# Patient Record
Sex: Male | Born: 1955 | Race: White | Hispanic: No | Marital: Married | Smoking: Former smoker
Health system: Southern US, Community
[De-identification: ages and names within clinical notes are randomized; demographics above are authoritative.]

## PROBLEM LIST (undated history)

## (undated) DIAGNOSIS — G4700 Insomnia, unspecified: Secondary | ICD-10-CM

## (undated) DIAGNOSIS — M5430 Sciatica, unspecified side: Secondary | ICD-10-CM

## (undated) DIAGNOSIS — E785 Hyperlipidemia, unspecified: Secondary | ICD-10-CM

## (undated) DIAGNOSIS — I252 Old myocardial infarction: Secondary | ICD-10-CM

## (undated) DIAGNOSIS — K219 Gastro-esophageal reflux disease without esophagitis: Secondary | ICD-10-CM

## (undated) DIAGNOSIS — R972 Elevated prostate specific antigen [PSA]: Secondary | ICD-10-CM

## (undated) DIAGNOSIS — IMO0002 Reserved for concepts with insufficient information to code with codable children: Secondary | ICD-10-CM

## (undated) DIAGNOSIS — H9193 Unspecified hearing loss, bilateral: Secondary | ICD-10-CM

## (undated) DIAGNOSIS — N138 Other obstructive and reflux uropathy: Secondary | ICD-10-CM

## (undated) DIAGNOSIS — R7303 Prediabetes: Secondary | ICD-10-CM

## (undated) DIAGNOSIS — R002 Palpitations: Secondary | ICD-10-CM

## (undated) DIAGNOSIS — N529 Male erectile dysfunction, unspecified: Secondary | ICD-10-CM

## (undated) DIAGNOSIS — I219 Acute myocardial infarction, unspecified: Secondary | ICD-10-CM

## (undated) DIAGNOSIS — J309 Allergic rhinitis, unspecified: Secondary | ICD-10-CM

## (undated) DIAGNOSIS — I10 Essential (primary) hypertension: Secondary | ICD-10-CM

## (undated) DIAGNOSIS — M1990 Unspecified osteoarthritis, unspecified site: Secondary | ICD-10-CM

## (undated) DIAGNOSIS — I2510 Atherosclerotic heart disease of native coronary artery without angina pectoris: Secondary | ICD-10-CM

## (undated) DIAGNOSIS — N401 Enlarged prostate with lower urinary tract symptoms: Secondary | ICD-10-CM

## (undated) HISTORY — DX: Insomnia, unspecified: G47.00

## (undated) HISTORY — DX: Unspecified hearing loss, bilateral: H91.93

## (undated) HISTORY — DX: Male erectile dysfunction, unspecified: N52.9

## (undated) HISTORY — DX: Sciatica, unspecified side: M54.30

## (undated) HISTORY — DX: Prediabetes: R73.03

## (undated) HISTORY — DX: Old myocardial infarction: I25.2

## (undated) HISTORY — DX: Palpitations: R00.2

## (undated) HISTORY — DX: Allergic rhinitis, unspecified: J30.9

## (undated) HISTORY — DX: Gastro-esophageal reflux disease without esophagitis: K21.9

## (undated) HISTORY — DX: Other obstructive and reflux uropathy: N13.8

## (undated) HISTORY — PX: HYDROCELE EXCISION / REPAIR: SUR1145

## (undated) HISTORY — DX: Hyperlipidemia, unspecified: E78.5

## (undated) HISTORY — DX: Elevated prostate specific antigen (PSA): R97.20

## (undated) HISTORY — PX: OTHER SURGICAL HISTORY: SHX169

## (undated) HISTORY — PX: PTCA: SHX146

## (undated) HISTORY — PX: COLONOSCOPY: SHX174

## (undated) HISTORY — PX: PROSTATE BIOPSY: SHX241

## (undated) HISTORY — DX: Reserved for concepts with insufficient information to code with codable children: IMO0002

## (undated) HISTORY — DX: Other obstructive and reflux uropathy: N40.1

---

## 1996-06-06 HISTORY — PX: INGUINAL HERNIA REPAIR: SUR1180

## 2003-03-07 DIAGNOSIS — C61 Malignant neoplasm of prostate: Secondary | ICD-10-CM

## 2003-03-07 HISTORY — DX: Malignant neoplasm of prostate: C61

## 2009-06-06 DIAGNOSIS — I252 Old myocardial infarction: Secondary | ICD-10-CM

## 2009-06-06 DIAGNOSIS — I219 Acute myocardial infarction, unspecified: Secondary | ICD-10-CM

## 2009-06-06 HISTORY — DX: Old myocardial infarction: I25.2

## 2009-06-06 HISTORY — DX: Acute myocardial infarction, unspecified: I21.9

## 2013-01-31 HISTORY — PX: CARDIAC CATHETERIZATION: SHX172

## 2015-01-05 HISTORY — PX: OTHER SURGICAL HISTORY: SHX169

## 2015-05-26 HISTORY — PX: CARDIOVASCULAR STRESS TEST: SHX262

## 2015-10-08 ENCOUNTER — Emergency Department (HOSPITAL_COMMUNITY): Payer: Managed Care, Other (non HMO)

## 2015-10-08 ENCOUNTER — Encounter (HOSPITAL_COMMUNITY): Payer: Self-pay | Admitting: Emergency Medicine

## 2015-10-08 ENCOUNTER — Emergency Department (HOSPITAL_COMMUNITY)
Admission: EM | Admit: 2015-10-08 | Discharge: 2015-10-08 | Disposition: A | Discharge status: Home / Self Care | Payer: Managed Care, Other (non HMO) | Attending: Emergency Medicine | Admitting: Emergency Medicine

## 2015-10-08 DIAGNOSIS — W260XXA Contact with knife, initial encounter: Secondary | ICD-10-CM | POA: Diagnosis not present

## 2015-10-08 DIAGNOSIS — Y9289 Other specified places as the place of occurrence of the external cause: Secondary | ICD-10-CM | POA: Diagnosis not present

## 2015-10-08 DIAGNOSIS — I10 Essential (primary) hypertension: Secondary | ICD-10-CM | POA: Diagnosis not present

## 2015-10-08 DIAGNOSIS — Y9389 Activity, other specified: Secondary | ICD-10-CM | POA: Diagnosis not present

## 2015-10-08 DIAGNOSIS — Y998 Other external cause status: Secondary | ICD-10-CM | POA: Diagnosis not present

## 2015-10-08 DIAGNOSIS — Z7902 Long term (current) use of antithrombotics/antiplatelets: Secondary | ICD-10-CM | POA: Insufficient documentation

## 2015-10-08 DIAGNOSIS — S61012A Laceration without foreign body of left thumb without damage to nail, initial encounter: Secondary | ICD-10-CM | POA: Diagnosis not present

## 2015-10-08 DIAGNOSIS — IMO0002 Reserved for concepts with insufficient information to code with codable children: Secondary | ICD-10-CM

## 2015-10-08 DIAGNOSIS — I2510 Atherosclerotic heart disease of native coronary artery without angina pectoris: Secondary | ICD-10-CM | POA: Insufficient documentation

## 2015-10-08 DIAGNOSIS — T148 Other injury of unspecified body region: Secondary | ICD-10-CM | POA: Insufficient documentation

## 2015-10-08 HISTORY — DX: Atherosclerotic heart disease of native coronary artery without angina pectoris: I25.10

## 2015-10-08 HISTORY — DX: Essential (primary) hypertension: I10

## 2015-10-08 MED ORDER — LIDOCAINE-EPINEPHRINE (PF) 2 %-1:200000 IJ SOLN
10.0000 mL | Freq: Once | INTRAMUSCULAR | Status: AC
  Administered 2015-10-08: 10 mL
  Filled 2015-10-08: qty 20

## 2015-10-08 NOTE — ED Provider Notes (Signed)
CSN: [id number]     Arrival date & time 10/08/15  1201 History   First MD Initiated Contact with Patient 10/08/15 1217     No chief complaint on file.    (Consider location/radiation/quality/duration/timing/severity/associated sxs/prior Treatment) HPI Comments: Yale Wilner is a 60 y.o. male reports to ED with laceration to left thumb. Patient is currently on blood thinners Brilanta and ASA. Injury occurred approximately 1.5 hours ago. He was filleting a fish when he was stung by a bee, the knife slipped and he cut his left distal lateral thumb. Denies numbness, tingling, or weakness in thumb. No lightheadedness or dizziness. Believes cut is superficial.   The history is provided by the patient.    Past Medical History  Diagnosis Date  . Coronary artery disease   . Hypertension    Past Surgical History  Procedure Laterality Date  . Ptca     No family history on file. Social History  Substance Use Topics  . Smoking status: Never Smoker   . Smokeless tobacco: None  . Alcohol Use: Yes    Review of Systems  Respiratory: Negative for shortness of breath.   Cardiovascular: Negative for chest pain.  Skin: Positive for wound.  Neurological: Negative for dizziness, weakness, light-headedness and numbness.      Allergies  Review of patient's allergies indicates no known allergies.  Home Medications   Prior to Admission medications   Not on File   BP 120/76 mmHg  Pulse 54  Temp(Src) 98.1 F (36.7 C)  Resp 16  SpO2 98% Physical Exam  Constitutional: He appears well-developed and well-nourished. No distress.  HENT:  Head: Normocephalic and atraumatic.  Eyes: Conjunctivae are normal. No scleral icterus.  Neck: Normal range of motion.  Cardiovascular:  2+ radial pulse and capillary refill intact in all fingers of left hand.    Pulmonary/Chest: Effort normal. No respiratory distress.  Musculoskeletal: Normal range of motion.  Neurological: He is alert.  Strength is  intact, ROM intact, and sensation intact in left thumb.   Skin: Skin is warm and dry. Bruising and laceration noted. He is not diaphoretic.     Psychiatric: He has a normal mood and affect.    ED Course  .Marland KitchenLaceration Repair Date/Time: 10/08/2015 2:32 PM Performed by: Roxanna Mew Authorized by: Roxanna Mew Consent: Verbal consent obtained. Risks and benefits: risks, benefits and alternatives were discussed Consent given by: patient Patient understanding: patient states understanding of the procedure being performed Patient consent: the patient's understanding of the procedure matches consent given Procedure consent: procedure consent matches procedure scheduled Relevant documents: relevant documents present and verified Site marked: the operative site was marked Imaging studies: imaging studies available Required items: required blood products, implants, devices, and special equipment available Patient identity confirmed: verbally with patient and arm band Time out: Immediately prior to procedure a "time out" was called to verify the correct patient, procedure, equipment, support staff and site/side marked as required. Body area: upper extremity Location details: left thumb Laceration length: 3.8 cm Foreign bodies: no foreign bodies Tendon involvement: none Nerve involvement: none Vascular damage: no Anesthesia: local infiltration Local anesthetic: lidocaine 2% with epinephrine Anesthetic total: 4 ml Patient sedated: no Preparation: Patient was prepped and draped in the usual sterile fashion. Irrigation solution: saline Irrigation method: syringe Amount of cleaning: standard Debridement: none Degree of undermining: none Skin closure: Ethilon (4-0) Number of sutures: 8 Technique: simple Approximation: close Approximation difficulty: simple Dressing: antibiotic ointment and 4x4 sterile gauze Patient tolerance: Patient tolerated  the procedure well with no  immediate complications   (including critical care time) Labs Review Labs Reviewed - No data to display  Imaging Review Dg Finger Thumb Left  10/08/2015  CLINICAL DATA:  laceration with fishing knife to prox lat 1st digit area today ,lots of bleeding EXAM: LEFT THUMB 2+V COMPARISON:  None. FINDINGS: No acute fracture or dislocation. Mild asymmetric narrowing of the first metacarpal phalangeal joint. Old fracture of the medial base of the proximal phalanx of the thumb. No radiopaque foreign body. IMPRESSION: No fracture.  No radiopaque foreign body.  No dislocation. Electronically Signed   By: Lajean Manes M.D.   On: 10/08/2015 14:00   I have personally reviewed and evaluated these images and lab results as part of my medical decision-making. No fracture or dislocation of left thumb. No foreign bodies noted. In line with radiology report.    EKG Interpretation None      MDM   Final diagnoses:  Laceration    Patient reports to ED following left thumb laceration with fishing knife. Patient is on blood thinners. Strength and ROM intact of left thumb. Neurovascularly intact. Laceration is 1.5 inches, "u" shaped on the proximal lateral aspect at left thumb base, close to thenar eminence. Radiologic images of thumb were negative for foreign body or fracture. I completed a local anesthetic block, irrigated and explored wound beg, following sterile practices placed 8 sutures, and dressed the wound with ABX ointment and sterile bandage. Patient tolerated procedure well. Last tetanus shot was one month ago. Discussed wound care and return precautions with patient to include fever, streaking up the arm, or redness/swelling/warmth/malodorous discharge of the wound site. Discussed follow up with PCP in next 7 days for suture removal. Patient voiced understanding and is agreeable.     Roxanna Mew, [place] 10/08/15 1626  Dorie Rank, MD 10/09/15 [phone]

## 2015-10-08 NOTE — ED Notes (Signed)
Pt sent from an Urgent Care. Laceration to left thumb with knife while cutting fish. Was stung by a bee to forehead which caused his knife to slip. Hand is currently wrapped in compression dressing from [facility].

## 2015-10-08 NOTE — Discharge Instructions (Signed)
Keep wound area clean and dry. Follow up with PCP in the next week for suture removal. If you develop fever, chills, night sweats or pain, swelling, erythema, malodorous discharge, or streaking up your arm return to the ED immediately.

## 2015-10-08 NOTE — ED Notes (Signed)
suture cart at bedside

## 2015-10-08 NOTE — ED Notes (Signed)
Up to bathroom

## 2015-10-08 NOTE — ED Notes (Signed)
PT IS ON BRILANTA (BLOOD THINNER).

## 2015-12-17 ENCOUNTER — Encounter: Payer: Self-pay | Admitting: Family Medicine

## 2015-12-17 ENCOUNTER — Ambulatory Visit (INDEPENDENT_AMBULATORY_CARE_PROVIDER_SITE_OTHER): Payer: Managed Care, Other (non HMO) | Admitting: Family Medicine

## 2015-12-17 VITALS — BP 97/66 | HR 61 | Temp 98.2°F | Resp 16 | Ht 68.0 in | Wt 183.0 lb

## 2015-12-17 DIAGNOSIS — I2510 Atherosclerotic heart disease of native coronary artery without angina pectoris: Secondary | ICD-10-CM | POA: Diagnosis not present

## 2015-12-17 DIAGNOSIS — M7581 Other shoulder lesions, right shoulder: Secondary | ICD-10-CM | POA: Diagnosis not present

## 2015-12-17 DIAGNOSIS — N138 Other obstructive and reflux uropathy: Secondary | ICD-10-CM

## 2015-12-17 DIAGNOSIS — Z9861 Coronary angioplasty status: Secondary | ICD-10-CM | POA: Diagnosis not present

## 2015-12-17 DIAGNOSIS — M7521 Bicipital tendinitis, right shoulder: Secondary | ICD-10-CM

## 2015-12-17 DIAGNOSIS — N401 Enlarged prostate with lower urinary tract symptoms: Secondary | ICD-10-CM

## 2015-12-17 DIAGNOSIS — Z85828 Personal history of other malignant neoplasm of skin: Secondary | ICD-10-CM

## 2015-12-17 DIAGNOSIS — Z955 Presence of coronary angioplasty implant and graft: Secondary | ICD-10-CM

## 2015-12-17 NOTE — Progress Notes (Addendum)
Office Note 12/17/2015  CC:  Chief Complaint  Patient presents with  . Establish Care    Pt is not fasting.  . Shoulder Pain    right shoulder x 2-3 weeks    HPI:  Ricky Chavez is a 60 y.o. White male who is here to establish care and discuss R shoulder pain. Patient's most recent primary MD: Dr. Jeanell Sparrow in [place], [place]. Old records were not reviewed prior to or during today's visit.  Right shoulder pain, onset 3 weeks ago, after going fishing a lot.  Has rested it some.  Has taken tylenol 1000mg  once a day on bad days.  Front aspect of shoulder is painful with certain ranges of motion, but at rest it does not hurt.  No catching or locking.  Denies hx of similar problem.  If he lays on it too long it starts to hurt a lot. No neck pain.  No radiation of the pain.  No paresthesias.  Past Medical History  Diagnosis Date  . Coronary artery disease     Brylinta and ASA lifetime due to high risk status.  . Hypertension   . Nonmelanoma skin cancer     SCC  . Allergic rhinitis   . Hyperlipidemia   . History of myocardial infarction   . BPH with obstruction/lower urinary tract symptoms   . Hearing loss of both ears     Past Surgical History  Procedure Laterality Date  . Ptca      Cath 2011---4 stents.  2014 repeat cath for CP showed no changes.  . Inguinal hernia repair  1998  . Colonoscopy  2008    Normal    Family History  Problem Relation Age of Onset  . Hyperlipidemia Mother   . Hypertension Mother   . Heart disease Mother   . Heart disease Father   . Hyperlipidemia Father   . Hypertension Father   . Hypothyroidism Sister   . Heart attack Brother   . Prostate cancer Paternal Uncle   . Stroke Neg Hx   . Diabetes Neg Hx   . CAD Brother     Social History   Social History  . Marital Status: Married    Spouse Name: N/A  . Number of Children: N/A  . Years of Education: N/A   Occupational History  . Not on file.   Social History Main Topics  .  Smoking status: Former Smoker -- 0.25 packs/day for 2 years    Types: Cigarettes    Quit date: 06/06/1978  . Smokeless tobacco: Never Used  . Alcohol Use: 4.2 oz/week    7 Glasses of wine per week  . Drug Use: No  . Sexual Activity: Not on file   Other Topics Concern  . Not on file   Social History Narrative   Married, 2 grown children.   Relocated to [place] from [facility] 2016.   Educ: Masters degree   Occup: retired Government social research officer with [employer]   No tob.   Alc: 1 glass wine/night.    Outpatient Encounter Prescriptions as of 12/17/2015  Medication Sig  . aspirin EC 81 MG tablet Take 1 tablet by mouth daily.  Marland Kitchen atorvastatin (LIPITOR) 40 MG tablet Take 1 tablet by mouth daily.  . carvedilol (COREG) 6.25 MG tablet Take 1 tablet by mouth 2 (two) times daily.  . Dutasteride-Tamsulosin HCl (JALYN) 0.5-0.4 MG CAPS Take 1 tablet by mouth daily.  Marland Kitchen lisinopril (PRINIVIL,ZESTRIL) 5 MG tablet Take 1 tablet by mouth daily.  Marland Kitchen  Melatonin (MELATONIN MAXIMUM STRENGTH) 5 MG TABS Take 1 tablet by mouth at bedtime.  . Multiple Vitamins-Minerals (MULTIVITAMIN ADULT PO) Take 1 capsule by mouth daily.  . nitroGLYCERIN (NITROSTAT) 0.4 MG SL tablet Place 1 tablet under the tongue every 5 (five) minutes as needed. As needed for chest pain mas of 3 doses in 15 minutes.  Marland Kitchen omeprazole (PRILOSEC) 20 MG capsule Take 1 capsule by mouth daily.  . ticagrelor (BRILINTA) 60 MG TABS tablet Take 1 tablet by mouth 2 (two) times daily.  . vardenafil (LEVITRA) 20 MG tablet Take 1 tablet by mouth daily as needed.   No facility-administered encounter medications on file as of 12/17/2015.    No Known Allergies  ROS Review of Systems  Constitutional: Negative for fever and fatigue.  HENT: Negative for congestion and sore throat.   Eyes: Negative for visual disturbance.  Respiratory: Negative for cough.   Cardiovascular: Negative for chest pain.  Gastrointestinal: Negative for nausea and abdominal pain.   Genitourinary: Negative for dysuria.  Musculoskeletal: Positive for arthralgias (see HPI). Negative for back pain and joint swelling.  Skin: Negative for rash.  Neurological: Negative for weakness and headaches.  Hematological: Negative for adenopathy.    PE; Blood pressure 97/66, pulse 61, temperature 98.2 F (36.8 C), temperature source Oral, resp. rate 16, height 5\' 8"  (1.727 m), weight 183 lb (83.008 kg), SpO2 96 %. Gen: Alert, well appearing.  Patient is oriented to person, place, time, and situation. [id number]: no injection, icteris, swelling, or exudate.  EOMI, PERRLA. Mouth: lips without lesion/swelling.  Oral mucosa pink and moist. Oropharynx without erythema, exudate, or swelling.  CV: RRR, no m/r/g.   LUNGS: CTA bilat, nonlabored resps, good aeration in all lung fields. EXT: no clubbing, cyanosis, or edema.  Right shoulder: nontender except for directly over the long head of biceps in bicipital groove. Mildly + speed's and mildly + Yergason's. AC joint nontender.  No tenderness around/on acromion. Pain with aBduction at 90 deg and through full abduction.  Neg drop sign. No pain with ER or IR.  Arm strength 5/5 prox/dist.  Pertinent labs:  None today  ASSESSMENT AND PLAN:   New pt; need to obtain old records.  1) Right shoulder pain; suspect combination of supraspinatus tendonitis and bicipital tendonitis. Given that this has only been going on 3 weeks and he is on Brylinta and ASA, I recommended PT over steroid injection at this time.  He was agreeable to this approach, ordered PT referral to [facility] today. He'll continue tylenol 1000mg  q6h prn.  2) CAD, with hx of MI/PCTA/Stenting in 2011.  He says that due to his coronary anatomy and his strong FH of CV dz, his cardiologist recommended he stay on DAPT lifetime unless contraindication/adverse side effect arose. Will get old records. Referred pt to one of our [place] cardiologist's today.  3) BPH with lower  urinary tract obst sx's: pt desires referral to specialist in the area since going to his [facility] specialist is no longer feasible.  Referral ordered today.  4) hx of nonmelanoma skin cancer: refer to local derm for ongoing surveillance.  An After Visit Summary was printed and given to the patient.  Return in about 4 weeks (around 01/14/2016) for 30 min appt (fasting) f/u shoulder and may need labs.  Signed:  Crissie Sickles, MD           12/17/2015   ADDENDUM 12/22/15:  Most recent labs in prior PCP's records are dated 09/01/15: CMET normal and  CBC normal and lipid panel normal.  HbA1c 5.6% 09/01/15. Also, PSA on 07/15/15 was 4.61.

## 2015-12-17 NOTE — Progress Notes (Signed)
Pre visit review using our clinic review tool, if applicable. No additional management support is needed unless otherwise documented below in the visit note. 

## 2015-12-22 ENCOUNTER — Encounter: Payer: Self-pay | Admitting: Family Medicine

## 2016-01-13 ENCOUNTER — Ambulatory Visit: Payer: Managed Care, Other (non HMO) | Admitting: Family Medicine

## 2016-01-14 ENCOUNTER — Ambulatory Visit: Payer: Managed Care, Other (non HMO) | Admitting: Cardiology

## 2016-01-27 ENCOUNTER — Ambulatory Visit (INDEPENDENT_AMBULATORY_CARE_PROVIDER_SITE_OTHER): Payer: Managed Care, Other (non HMO) | Admitting: Family Medicine

## 2016-01-27 ENCOUNTER — Encounter: Payer: Self-pay | Admitting: Family Medicine

## 2016-01-27 ENCOUNTER — Encounter: Payer: Self-pay | Admitting: Gastroenterology

## 2016-01-27 VITALS — BP 100/69 | HR 54 | Temp 98.0°F | Resp 16 | Ht 68.0 in | Wt 185.5 lb

## 2016-01-27 DIAGNOSIS — K625 Hemorrhage of anus and rectum: Secondary | ICD-10-CM

## 2016-01-27 DIAGNOSIS — M25511 Pain in right shoulder: Secondary | ICD-10-CM

## 2016-01-27 NOTE — Progress Notes (Signed)
Pre visit review using our clinic review tool, if applicable. No additional management support is needed unless otherwise documented below in the visit note. 

## 2016-01-27 NOTE — Progress Notes (Signed)
OFFICE VISIT  01/27/2016   CC:  Chief Complaint  Patient presents with  . Follow-up    shoulder   HPI:    Patient is a 60 y.o. Caucasian male who presents for 6 week f/u right shoulder pain--suspected supraspinatus and bicipital tendonitis. Has been getting PT.  He benefited greatly from PT.  Was essentially pain-free and then went go cart riding recently and exacerbated his pain.  He feels comfortable doing the home exercises that PT showed him, though.  He recently had a couple episodes of BRBPR in last couple weeks.  In between these episodes he had no blood on toilet tissue.  The blood did not appear to be in the fecal matter.  No pain.  No episodes of melena. He doesn't feel anything prolapsing from anus. No prob with constipation.  Says last couple months stool has been softer than his normal but he is not having more frequent or less frequent BMs during this time.  No straining.  He does sit on toilet and read quite a bit.  Past Medical History:  Diagnosis Date  . Allergic rhinitis   . BPH with obstruction/lower urinary tract symptoms   . Coronary artery disease    Brylinta and ASA lifetime due to high risk status.  . Elevated PSA onset @ 2009   Neg bx x 3, most recent 2009 ([facility] in [place]).  PSA stable at 3.98 12/01/14 per old PCP records.  Marland Kitchen GERD (gastroesophageal reflux disease)   . Hearing loss of both ears   . History of myocardial infarction 2011  . Hyperlipidemia   . Hypertension   . Insomnia   . Nonmelanoma skin cancer    SCC  . Organic impotence     Past Surgical History:  Procedure Laterality Date  . CARDIAC CATHETERIZATION  01/31/13   Stents to LAD patent; small diagonal stent with 90% ostial stenosis--good overall LV function with mild anterior and focal inferoapical hypokinesis, EF 60-65%.  Unsuccessful angioplasty of diagonal.  . CARDIOVASCULAR STRESS TEST  05/26/15   stress echo neg for ischemia  . COLONOSCOPY  2008   Normal  .  HYDROCELE EXCISION / REPAIR  early 2000's  . INGUINAL HERNIA REPAIR  1998  . PROSTATE BIOPSY     Multiple; all neg  . PTCA     Cath 2011---4 stents--LAD and diagonal.  2014 repeat cath for CP showed no changes.  . Scrotal u/s  01/2015   numerous extratesticular cystic lesions bilat, c/w numerous epididymal cysts vs loculated hydrocele--following expectantly.    Outpatient Medications Prior to Visit  Medication Sig Dispense Refill  . aspirin EC 81 MG tablet Take 1 tablet by mouth daily.    Marland Kitchen atorvastatin (LIPITOR) 40 MG tablet Take 1 tablet by mouth daily.    . carvedilol (COREG) 6.25 MG tablet Take 1 tablet by mouth 2 (two) times daily.    . Dutasteride-Tamsulosin HCl (JALYN) 0.5-0.4 MG CAPS Take 1 tablet by mouth daily.    Marland Kitchen lisinopril (PRINIVIL,ZESTRIL) 5 MG tablet Take 1 tablet by mouth daily.    . Melatonin (MELATONIN MAXIMUM STRENGTH) 5 MG TABS Take 1 tablet by mouth at bedtime.    . Multiple Vitamins-Minerals (MULTIVITAMIN ADULT PO) Take 1 capsule by mouth daily.    . nitroGLYCERIN (NITROSTAT) 0.4 MG SL tablet Place 1 tablet under the tongue every 5 (five) minutes as needed. As needed for chest pain mas of 3 doses in 15 minutes.    Marland Kitchen omeprazole (PRILOSEC) 20 MG  capsule Take 1 capsule by mouth daily.    . ticagrelor (BRILINTA) 60 MG TABS tablet Take 1 tablet by mouth 2 (two) times daily.    . vardenafil (LEVITRA) 20 MG tablet Take 1 tablet by mouth daily as needed.     No facility-administered medications prior to visit.     No Known Allergies  ROS As per HPI  PE: Blood pressure 100/69, pulse (!) 54, temperature 98 F (36.7 C), temperature source Oral, resp. rate 16, height 5\' 8"  (1.727 m), weight 185 lb 8 oz (84.1 kg), SpO2 97 %. Gen: Alert, well appearing.  Patient is oriented to person, place, time, and situation. AFFECT: pleasant, lucid thought and speech. Rectal exam: negative without mass, lesions or tenderness. Prostate without asymmetry, nodularity, or  tenderness. No blood on glove.  LABS:  None today  IMPRESSION AND PLAN:  1) Right shoulder tendonitis pain: very good response to PT. He'll continue these exercises for his recent "flare" of pain after go-cart riding.  2) Bright red blood per rectum: suspect internal hemorrhoids. Pt is due for his 10 year repeat colonoscopy in 2018. He is in favor of a referral to GI at this time. Advised pt to sit on toilet to have BM and then spend no further time sitting.  3) Chronic illness lab f/u: he has had all necessary monitoring labs at last CPE with his previous PCP back in March this year.  We'll plan on rechecking these labs again at his next CPE here in March 2018. (Health panel + PSA).  An After Visit Summary was printed and given to the patient.  FOLLOW UP: Return in about 7 months (around 08/26/2016) for annual CPE (fasting).  Signed:  Crissie Sickles, MD           01/27/2016

## 2016-03-23 ENCOUNTER — Encounter: Payer: Self-pay | Admitting: Family Medicine

## 2016-03-30 ENCOUNTER — Encounter: Payer: Self-pay | Admitting: *Deleted

## 2016-03-31 NOTE — Progress Notes (Signed)
Cardiology Office Note   Date:  04/01/2016   ID:  Ricky Chavez, DOB Aug 01, 1955, MRN [id number]  PCP:  [name], MD  Cardiologist:   Minus Breeding, MD  Referring:  [name], MD  Chief Complaint  Patient presents with  . Coronary Artery Disease      History of Present Illness: Ricky Chavez is a 60 y.o. male who presents for follow up of CAD.  The patient was treated at [facility] for CAD in late 2016.  I see records indicating a cardiac cath with stents to LAD patent; small diagonal stent with 90% ostial stenosis--good overall LV function with mild anterior and focal inferoapical hypokinesis, EF 60-65%. Unsuccessful angioplasty of the diagonal.  He had a stress echo in 2016 December that was negative for any evidence of ischemia.  The patient had a complicated course with his initial angioplasty and I have reviewed these records. However, he did have a stress echocardiogram as mentioned there was negative in December. I have these very active having moved to this area. 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 He does exercises. With this he denies any chest pressure, neck or arm discomfort. He doesn't have any palpitations, presyncope or syncope. Denies any PND or orthopnea. He's had no weight gain or edema.  Past Medical History:  Diagnosis Date  . Allergic rhinitis   . BPH with obstruction/lower urinary tract symptoms   . Coronary artery disease    Brilinta and ASA lifetime due to high risk status.  . Elevated PSA onset @ 2009   Neg bx x 3, most recent 2009 ([facility] in [place]).  PSA stable at 3.98 12/01/14 and 4.61 on 07/25/15 per old records.  PSA 10 at Dr. Noland Fordyce ([facility]) 03/2016: flomax started and plan is to recheck PSA and f/u with urol in 6 mo.  Marland Kitchen GERD (gastroesophageal reflux disease)   . Hearing loss of both ears   . History of myocardial infarction 2011  . Hyperlipidemia   . Hypertension   . Insomnia   . Nonmelanoma skin cancer    SCC  . Organic impotence     Past Surgical History:  Procedure Laterality Date  . CARDIAC CATHETERIZATION  01/31/13   Stents to LAD patent; small diagonal stent with 90% ostial stenosis--good overall LV function with mild anterior and focal inferoapical hypokinesis, EF 60-65%.  Unsuccessful angioplasty of diagonal.  . CARDIOVASCULAR STRESS TEST  05/26/15   stress echo neg for ischemia  . COLONOSCOPY  2008   Normal  . HYDROCELE EXCISION / REPAIR  early 2000's  . INGUINAL HERNIA REPAIR  1998  . PROSTATE BIOPSY     Multiple; all neg  . PTCA     Cath 2011---4 stents--LAD and diagonal.  2014 repeat cath for CP showed no changes.  . Scrotal u/s  01/2015   numerous extratesticular cystic lesions bilat, c/w numerous epididymal cysts vs loculated hydrocele--following expectantly.     Current Outpatient Prescriptions  Medication Sig Dispense Refill  . aspirin EC 81 MG tablet Take 1 tablet by mouth daily.    Marland Kitchen atorvastatin (LIPITOR) 40 MG tablet Take 1 tablet by mouth daily.    . carvedilol (COREG) 6.25 MG tablet Take 1 tablet (6.25 mg total) by mouth 2 (two) times daily. 180 tablet 3  . lisinopril (PRINIVIL,ZESTRIL) 5 MG tablet Take 1 tablet (5 mg total) by mouth daily. 90 tablet 3  . Melatonin (MELATONIN MAXIMUM STRENGTH) 5 MG TABS Take 1 tablet by mouth at  bedtime.    . Multiple Vitamins-Minerals (MULTIVITAMIN ADULT PO) Take 1 capsule by mouth daily.    . nitroGLYCERIN (NITROSTAT) 0.4 MG SL tablet Place 1 tablet (0.4 mg total) under the tongue every 5 (five) minutes as needed. As needed for chest pain mas of 3 doses in 15 minutes. 25 tablet 3  . omeprazole (PRILOSEC) 20 MG capsule Take 1 capsule by mouth daily.    . tamsulosin (FLOMAX) 0.4 MG CAPS capsule Take 0.4 mg by mouth daily.    . ticagrelor (BRILINTA) 60 MG TABS tablet Take 1 tablet (60 mg total) by mouth 2 (two) times daily. 180 tablet 3  . vardenafil (LEVITRA) 20 MG tablet Take 1 tablet by mouth daily as needed.     No current  facility-administered medications for this visit.     Allergies:   Review of patient's allergies indicates no known allergies.    Social History:  The patient  reports that he quit smoking about 37 years ago. His smoking use included Cigarettes. He has a 0.50 pack-year smoking history. He has never used smokeless tobacco. He reports that he drinks about 4.2 oz of alcohol per week . He reports that he does not use drugs.   Family History:  The patient's family history includes Heart attack (age of onset: 24) in his brother; Heart disease (age of onset: 11) in his father and mother; Hyperlipidemia in his father and mother; Hypertension in his father and mother; Hypothyroidism in his sister; Peripheral vascular disease in his brother; Prostate cancer in his paternal uncle.    ROS:  Please see the history of present illness.   Otherwise, review of systems are positive for none.   All other systems are reviewed and negative.    PHYSICAL EXAM: VS:  BP 112/79   Pulse 73   Ht 5\' 8"  (1.727 m)   Wt 183 lb (83 kg)   SpO2 98%   BMI 27.83 kg/m  , BMI Body mass index is 27.83 kg/m. GENERAL:  Well appearing HEENT:  Pupils equal round and reactive, fundi not visualized, oral mucosa unremarkable NECK:  No jugular venous distention, waveform within normal limits, carotid upstroke brisk and symmetric, no bruits, no thyromegaly LYMPHATICS:  No cervical, inguinal adenopathy LUNGS:  Clear to auscultation bilaterally BACK:  No CVA tenderness CHEST:  Unremarkable HEART:  PMI not displaced or sustained,S1 and S2 within normal limits, no S3, no S4, no clicks, no rubs, no murmurs ABD:  Flat, positive bowel sounds normal in frequency in pitch, no bruits, no rebound, no guarding, no midline pulsatile mass, no hepatomegaly, no splenomegaly EXT:  2 plus pulses throughout, no edema, no cyanosis no clubbing SKIN:  No rashes no nodules NEURO:  Cranial nerves II through XII grossly intact, motor grossly intact  throughout PSYCH:  Cognitively intact, oriented to person place and time    EKG:  EKG is not ordered today.    Recent Labs: No results found for requested labs within last 8760 hours.    Lipid Panel No results found for: CHOL, TRIG, HDL, CHOLHDL, VLDL, LDLCALC, LDLDIRECT    Wt Readings from Last 3 Encounters:  04/01/16 183 lb (83 kg)  01/27/16 185 lb 8 oz (84.1 kg)  12/17/15 183 lb (83 kg)      Other studies Reviewed: Additional studies/ records that were reviewed today include: Extensive hospital records. Review of the above records demonstrates:  Please see elsewhere in the note.     ASSESSMENT AND PLAN:  CAD:  The patient has no new sypmtoms.  No further cardiovascular testing is indicated.  We will continue with aggressive risk reduction and meds as listed.  Of note it was suggested that he should continue on life long DAPT.  DYSLIPIDEMIA:  He has an excellent lipid profile.  He will continue the current therapy.  HTN:  The blood pressure is at target. No change in medications is indicated. We will continue with therapeutic lifestyle changes (TLC).   Current medicines are reviewed at length with the patient today.  The patient does not have concerns regarding medicines.  The following changes have been made:  no change  Labs/ tests ordered today include:   Orders Placed This Encounter  Procedures  . EKG 12-Lead     Disposition:   FU with me in six months.     Signed, Minus Breeding, MD  04/01/2016 5:07 PM    [place]

## 2016-04-01 ENCOUNTER — Ambulatory Visit (INDEPENDENT_AMBULATORY_CARE_PROVIDER_SITE_OTHER): Payer: Managed Care, Other (non HMO) | Admitting: Cardiology

## 2016-04-01 ENCOUNTER — Encounter: Payer: Self-pay | Admitting: Cardiology

## 2016-04-01 VITALS — BP 112/79 | HR 73 | Ht 68.0 in | Wt 183.0 lb

## 2016-04-01 DIAGNOSIS — I10 Essential (primary) hypertension: Secondary | ICD-10-CM

## 2016-04-01 DIAGNOSIS — I25810 Atherosclerosis of coronary artery bypass graft(s) without angina pectoris: Secondary | ICD-10-CM

## 2016-04-01 MED ORDER — TICAGRELOR 60 MG PO TABS: 60.0000 mg | ORAL_TABLET | Freq: Two times a day (BID) | ORAL | 3 refills | Status: DC

## 2016-04-01 MED ORDER — LISINOPRIL 5 MG PO TABS: 5.0000 mg | ORAL_TABLET | Freq: Every day | ORAL | 3 refills | Status: DC

## 2016-04-01 MED ORDER — NITROGLYCERIN 0.4 MG SL SUBL: 0.4000 mg | SUBLINGUAL_TABLET | SUBLINGUAL | 3 refills | Status: DC | PRN

## 2016-04-01 MED ORDER — CARVEDILOL 6.25 MG PO TABS
6.2500 mg | ORAL_TABLET | Freq: Two times a day (BID) | ORAL | 3 refills | Status: DC
Start: 2016-04-01 — End: 2016-09-28

## 2016-04-01 NOTE — Patient Instructions (Signed)

## 2016-04-06 ENCOUNTER — Ambulatory Visit (INDEPENDENT_AMBULATORY_CARE_PROVIDER_SITE_OTHER): Payer: Managed Care, Other (non HMO) | Admitting: Gastroenterology

## 2016-04-06 ENCOUNTER — Telehealth: Payer: Self-pay

## 2016-04-06 ENCOUNTER — Encounter: Payer: Self-pay | Admitting: Gastroenterology

## 2016-04-06 VITALS — BP 100/66 | HR 56 | Ht 68.0 in | Wt 184.0 lb

## 2016-04-06 DIAGNOSIS — Z1211 Encounter for screening for malignant neoplasm of colon: Secondary | ICD-10-CM

## 2016-04-06 DIAGNOSIS — K625 Hemorrhage of anus and rectum: Secondary | ICD-10-CM

## 2016-04-06 MED ORDER — NA SULFATE-K SULFATE-MG SULF 17.5-3.13-1.6 GM/177ML PO SOLN: 1.0000 | Freq: Once | ORAL | 0 refills | Status: AC

## 2016-04-06 NOTE — Progress Notes (Signed)
HPI: This is a   very pleasant 60 year old man  who was referred to me by [name], MD  to evaluate  bloody diarrhea .    Chief complaint is bloody diarrhea   2 days of brigth red rectal bleeding.  Was having diarrhea at the same.  Was out of town, very spicy [place] food.  This was 3 months ago and since then his bowels have completely been normal. The bloody diarrhea lasted for about a day with 5 episodes.  He's had minor bleeding over years.  Was queasy aroiund that time.  Blood diarrhea.  Bowel have been fine since.  Overall gaining weight.  His father has had polyps.  Colonoscopy in [place]; in 2008 an dit was normal.  He is on brilinta   Review of systems: Pertinent positive and negative review of systems were noted in the above HPI section. Complete review of systems was performed and was otherwise normal.   Past Medical History:  Diagnosis Date  . Allergic rhinitis   . BPH with obstruction/lower urinary tract symptoms   . Coronary artery disease    Brilinta and ASA lifetime due to high risk status.  . Elevated PSA onset @ 2009   Neg bx x 3, most recent 2009 ([facility] in [place]).  PSA stable at 3.98 12/01/14 and 4.61 on 07/25/15 per old records.  PSA 10 at Dr. Noland Fordyce ([facility]) 03/2016: flomax started and plan is to recheck PSA and f/u with urol in 6 mo.  Marland Kitchen GERD (gastroesophageal reflux disease)   . Hearing loss of both ears   . History of myocardial infarction 2011  . Hyperlipidemia   . Hypertension   . Insomnia   . Organic impotence   . Squamous cell carcinoma     Past Surgical History:  Procedure Laterality Date  . CARDIAC CATHETERIZATION  01/31/13   Stents to LAD patent; small diagonal stent with 90% ostial stenosis--good overall LV function with mild anterior and focal inferoapical hypokinesis, EF 60-65%.  Unsuccessful angioplasty of diagonal.  . CARDIOVASCULAR STRESS TEST  05/26/15   stress echo neg for ischemia  . COLONOSCOPY  2008   Normal  . HYDROCELE EXCISION / REPAIR  early 2000's  . INGUINAL HERNIA REPAIR Bilateral 1998  . PROSTATE BIOPSY     Multiple; all neg  . PTCA     Cath 2011---4 stents--LAD and diagonal.  2014 repeat cath for CP showed no changes.  . Scrotal u/s  01/2015   numerous extratesticular cystic lesions bilat, c/w numerous epididymal cysts vs loculated hydrocele--following expectantly.    Current Outpatient Prescriptions  Medication Sig Dispense Refill  . aspirin EC 81 MG tablet Take 1 tablet by mouth daily.    Marland Kitchen atorvastatin (LIPITOR) 40 MG tablet Take 1 tablet by mouth daily.    . carvedilol (COREG) 6.25 MG tablet Take 1 tablet (6.25 mg total) by mouth 2 (two) times daily. 180 tablet 3  . lisinopril (PRINIVIL,ZESTRIL) 5 MG tablet Take 1 tablet (5 mg total) by mouth daily. 90 tablet 3  . Melatonin (MELATONIN MAXIMUM STRENGTH) 5 MG TABS Take 1 tablet by mouth at bedtime.    . Multiple Vitamins-Minerals (MULTIVITAMIN ADULT PO) Take 1 capsule by mouth daily.    . nitroGLYCERIN (NITROSTAT) 0.4 MG SL tablet Place 1 tablet (0.4 mg total) under the tongue every 5 (five) minutes as needed. As needed for chest pain mas of 3 doses in 15 minutes. 25 tablet 3  . omeprazole (PRILOSEC) 20 MG capsule  Take 1 capsule by mouth daily.    . tamsulosin (FLOMAX) 0.4 MG CAPS capsule Take 0.4 mg by mouth daily.    . ticagrelor (BRILINTA) 60 MG TABS tablet Take 1 tablet (60 mg total) by mouth 2 (two) times daily. 180 tablet 3  . vardenafil (LEVITRA) 20 MG tablet Take 1 tablet by mouth daily as needed.     No current facility-administered medications for this visit.     Allergies as of 04/06/2016  . (No Known Allergies)    Family History  Problem Relation Age of Onset  . Hyperlipidemia Mother   . Hypertension Mother   . Heart disease Mother 22    CABG  . Heart disease Father 5    CABG  . Hyperlipidemia Father   . Hypertension Father   . Colon polyps Father   . Hypothyroidism Sister   . Heart attack Brother  74    MI  . Prostate cancer Paternal Uncle   . Peripheral vascular disease Brother     Carotid   . Stroke Neg Hx   . Diabetes Neg Hx     Social History   Social History  . Marital status: Married    Spouse name: N/A  . Number of children: 2  . Years of education: N/A   Occupational History  . retired    Social History Main Topics  . Smoking status: Former Smoker    Packs/day: 0.25    Years: 2.00    Types: Cigarettes    Quit date: 06/06/1978  . Smokeless tobacco: Never Used  . Alcohol use 8.4 oz/week    14 Glasses of wine per week  . Drug use: No  . Sexual activity: Not on file   Other Topics Concern  . Not on file   Social History Narrative   Married, 2 grown children.   Relocated to [place] from [facility] 2016.   Educ: Masters degree   Occup: Retired Government social research officer with [employer]   No tob.   Alc: 1 glass wine/night.     Physical Exam: BP 100/66 (BP Location: Left Arm, Patient Position: Sitting, Cuff Size: Normal)   Pulse (!) 56   Ht 5\' 8"  (1.727 m) Comment: height measured without shoes  Wt 184 lb (83.5 kg)   BMI 27.98 kg/m  Constitutional: generally well-appearing Psychiatric: alert and oriented x3 Eyes: extraocular movements intact Mouth: oral pharynx moist, no lesions Neck: supple no lymphadenopathy Cardiovascular: heart regular rate and rhythm Lungs: clear to auscultation bilaterally Abdomen: soft, nontender, nondistended, no obvious ascites, no peritoneal signs, normal bowel sounds Extremities: no lower extremity edema bilaterally Skin: no lesions on visible extremities   Assessment and plan: 60 y.o. male with  Resolved bloody diarrhea, likely infectious  His bleeding episode was likely from self-limited infectious colitis. His bowels have been completely back to normal for the past 3 months since that 24 hour episode. I see no reason for any further workup for it however he is due for screening for colon cancer since his last testing was in  2008. We will arrange for colonoscopy to be done in early January 2018. He is on Brilinta for coronary artery disease.  we will communicate with his cardiologist about at least a 5 day hold prior to colonoscopy.   Owens Loffler, MD [facility] 04/06/2016, 11:17 AM  Cc: McGowen, Adrian Blackwater, MD

## 2016-04-06 NOTE — Patient Instructions (Signed)
You will be set up for a colonoscopy for screening. Brilinta hold for 5 days. We will communicate with your cardiologist about the safety of holding that medicine.

## 2016-04-06 NOTE — Telephone Encounter (Signed)
04/06/2016   RE: Sharbel Cioffi DOB: December 25, 1955 MRN: [id number]   Dear Dr Percival Spanish,    We have scheduled the above patient for an endoscopic procedure with Dr Ardis Hughs. Our records show that he is on anticoagulation therapy.   Please advise as to how long the patient may come off his therapy of Brilinta prior to the procedure, which is scheduled for 06/14/2016.  Please fax back/ or route response to Christian Mate RN  at [phone].   Sincerely,    Christian Mate RN

## 2016-04-07 ENCOUNTER — Encounter: Payer: Self-pay | Admitting: Family Medicine

## 2016-04-08 NOTE — Telephone Encounter (Signed)
Anti coag letter routed to Meredith Pel Dr Hochrein's nurse for review

## 2016-04-11 ENCOUNTER — Telehealth: Payer: Self-pay | Admitting: *Deleted

## 2016-04-11 NOTE — Telephone Encounter (Signed)
Requesting surgical clearance:   1. Type of surgery: Clonoscopy  2. Surgeon: Dr Ardis Hughs  3. Surgical date: 06/14/2016  4. Medications that need to be help: Brilinta  Pt saw you last on 10/27, is pt cleared for surgery and how long can brilinta be held?

## 2016-04-11 NOTE — Telephone Encounter (Signed)
Resent to Dr Percival Spanish and Meredith Pel

## 2016-04-14 NOTE — Telephone Encounter (Signed)
He could hold the Brilinta if necessary although he is on life long DAPT.  He would be at acceptable risk for the procedure.  If the colonoscopy cannot be continued through a diagnostic colonoscopy then it should be held for five days.

## 2016-04-15 NOTE — Telephone Encounter (Signed)
Pt has been notified to hold Brilinta 5 days prior to procedure in January 2018

## 2016-04-15 NOTE — Telephone Encounter (Signed)
Clearance send to Maylene Roes via [employer]

## 2016-06-14 ENCOUNTER — Ambulatory Visit (AMBULATORY_SURGERY_CENTER): Payer: Managed Care, Other (non HMO) | Admitting: Gastroenterology

## 2016-06-14 ENCOUNTER — Encounter: Payer: Self-pay | Admitting: Gastroenterology

## 2016-06-14 VITALS — BP 89/50 | HR 54 | Temp 97.3°F | Resp 16 | Wt 184.0 lb

## 2016-06-14 DIAGNOSIS — K649 Unspecified hemorrhoids: Secondary | ICD-10-CM | POA: Diagnosis not present

## 2016-06-14 DIAGNOSIS — K621 Rectal polyp: Secondary | ICD-10-CM | POA: Diagnosis not present

## 2016-06-14 DIAGNOSIS — K921 Melena: Secondary | ICD-10-CM | POA: Diagnosis present

## 2016-06-14 DIAGNOSIS — K625 Hemorrhage of anus and rectum: Secondary | ICD-10-CM

## 2016-06-14 DIAGNOSIS — D128 Benign neoplasm of rectum: Secondary | ICD-10-CM

## 2016-06-14 DIAGNOSIS — K635 Polyp of colon: Secondary | ICD-10-CM | POA: Diagnosis not present

## 2016-06-14 MED ORDER — SODIUM CHLORIDE 0.9 % IV SOLN: 500.0000 mL | INTRAVENOUS | Status: DC

## 2016-06-14 NOTE — Patient Instructions (Signed)
YOU HAD AN ENDOSCOPIC PROCEDURE TODAY AT [facility]:   Refer to the procedure report that was given to you for any specific questions about what was found during the examination.  If the procedure report does not answer your questions, please call your gastroenterologist to clarify.  If you requested that your care partner not be given the details of your procedure findings, then the procedure report has been included in a sealed envelope for you to review at your convenience later.  YOU SHOULD EXPECT: Some feelings of bloating in the abdomen. Passage of more gas than usual.  Walking can help get rid of the air that was put into your GI tract during the procedure and reduce the bloating. If you had a lower endoscopy (such as a colonoscopy or flexible sigmoidoscopy) you may notice spotting of blood in your stool or on the toilet paper. If you underwent a bowel prep for your procedure, you may not have a normal bowel movement for a few days.  Please Note:  You might notice some irritation and congestion in your nose or some drainage.  This is from the oxygen used during your procedure.  There is no need for concern and it should clear up in a day or so.  SYMPTOMS TO REPORT IMMEDIATELY:   Following lower endoscopy (colonoscopy or flexible sigmoidoscopy):  Excessive amounts of blood in the stool  Significant tenderness or worsening of abdominal pains  Swelling of the abdomen that is new, acute  Fever of 100F or higher   For urgent or emergent issues, a gastroenterologist can be reached at any hour by calling [phone].   DIET:  We do recommend a small meal at first, but then you may proceed to your regular diet.  Drink plenty of fluids but you should avoid alcoholic beverages for 24 hours.  Try to increase the fiber in your diet, and drink plenty of water.  ACTIVITY:  You should plan to take it easy for the rest of today and you should NOT DRIVE or use heavy machinery until  tomorrow (because of the sedation medicines used during the test).    FOLLOW UP: Our staff will call the number listed on your records the next business day following your procedure to check on you and address any questions or concerns that you may have regarding the information given to you following your procedure. If we do not reach you, we will leave a message.  However, if you are feeling well and you are not experiencing any problems, there is no need to return our call.  We will assume that you have returned to your regular daily activities without incident.  If any biopsies were taken you will be contacted by phone or by letter within the next 1-3 weeks.  Please call us at [phone] if you have not heard about the biopsies in 3 weeks.    SIGNATURES/CONFIDENTIALITY: You and/or your care partner have signed paperwork which will be entered into your electronic medical record.  These signatures attest to the fact that that the information above on your After Visit Summary has been reviewed and is understood.  Full responsibility of the confidentiality of this discharge information lies with you and/or your care-partner.  Resume your blood thinner today.  Read all of the handouts given to you by your recovery room nurse.  Thank-you for choosing [place] for your healthcare needs today.

## 2016-06-14 NOTE — Op Note (Signed)
[facility] Patient Name: Ricky Chavez Procedure Date: 06/14/2016 2:03 PM MRN: [id number] Endoscopist: Milus Banister , MD Age: 61 Referring MD:  Date of Birth: [birth year] Gender: Male Account #: [id number] Procedure:                Colonoscopy Indications:              Hematochezia Medicines:                Monitored Anesthesia Care Procedure:                Pre-Anesthesia Assessment:                           - Prior to the procedure, a History and Physical                            was performed, and patient medications and                            allergies were reviewed. The patient's tolerance of                            previous anesthesia was also reviewed. The risks                            and benefits of the procedure and the sedation                            options and risks were discussed with the patient.                            All questions were answered, and informed consent                            was obtained. Prior Anticoagulants: The patient has                            taken brilinta, last dose was 5 days prior to                            procedure. ASA Grade Assessment: II - A patient                            with mild systemic disease. After reviewing the                            risks and benefits, the patient was deemed in                            satisfactory condition to undergo the procedure.                           After obtaining informed consent, the colonoscope  was passed under direct vision. Throughout the                            procedure, the patient's blood pressure, pulse, and                            oxygen saturations were monitored continuously. The                            Model CF-HQ190L [id number]) scope was introduced                            through the anus and advanced to the the cecum,                            identified by appendiceal orifice and ileocecal                             valve. The colonoscopy was performed without                            difficulty. The patient tolerated the procedure                            well. The quality of the bowel preparation was                            good. The ileocecal valve, appendiceal orifice, and                            rectum were photographed. Scope In: 2:10:43 PM Scope Out: 2:19:43 PM Scope Withdrawal Time: 0 hours 7 minutes 45 seconds  Total Procedure Duration: 0 hours 9 minutes 0 seconds  Findings:                 Two sessile polyps were found in the rectum. The                            polyps were 2 to 3 mm in size. These polyps were                            removed with a cold snare. Resection and retrieval                            were complete.                           Internal hemorrhoids were found. The hemorrhoids                            were small.                           The exam was otherwise without abnormality on  direct and retroflexion views. Complications:            No immediate complications. Estimated blood loss:                            None. Estimated Blood Loss:     Estimated blood loss: none. Impression:               - Two 2 to 3 mm polyps in the rectum, removed with                            a cold snare. Resected and retrieved.                           - Internal hemorrhoids. These are the source of                            your minor intermittent bleeding.                           - The examination was otherwise normal on direct                            and retroflexion views. Recommendation:           - Patient has a contact number available for                            emergencies. The signs and symptoms of potential                            delayed complications were discussed with the                            patient. Return to normal activities tomorrow.                            Written  discharge instructions were provided to the                            patient.                           - Resume previous diet.                           - Continue present medications. OK to resume your                            blood thinner today.                           You will receive a letter within 2-3 weeks with the                            pathology results and my final recommendations.  If the polyp(s) is proven to be 'pre-cancerous' on                            pathology, you will need repeat colonoscopy in 5                            years. If the polyp(s) is NOT 'precancerous' on                            pathology then you should repeat colon cancer                            screening in 10 years with colonoscopy without need                            for colon cancer screening by any method prior to                            then (including stool testing). Milus Banister, MD 06/14/2016 2:23:04 PM This report has been signed electronically.

## 2016-06-14 NOTE — Progress Notes (Signed)
Report to PACU, RN, vss, BBS= Clear.  

## 2016-06-14 NOTE — Progress Notes (Signed)
Called to room to assist during endoscopic procedure.  Patient ID and intended procedure confirmed with present staff. Received instructions for my participation in the procedure from the performing physician.  

## 2016-06-15 ENCOUNTER — Telehealth: Payer: Self-pay

## 2016-06-15 NOTE — Telephone Encounter (Signed)
  Follow up Call-  Call back number 06/14/2016  Post procedure Call Back phone  # [phone]  Permission to leave phone message Yes     Patient questions:  Do you have a fever, pain , or abdominal swelling? No. Pain Score  0 *  Have you tolerated food without any problems? Yes.    Have you been able to return to your normal activities? Yes.    Do you have any questions about your discharge instructions: Diet   No. Medications  No. Follow up visit  No.  Do you have questions or concerns about your Care? No.  Actions: * If pain score is 4 or above: No action needed, pain <4.

## 2016-06-20 ENCOUNTER — Encounter: Payer: Self-pay | Admitting: Gastroenterology

## 2016-08-10 ENCOUNTER — Encounter: Payer: Self-pay | Admitting: Family Medicine

## 2016-08-25 ENCOUNTER — Encounter: Payer: Managed Care, Other (non HMO) | Admitting: Family Medicine

## 2016-09-01 ENCOUNTER — Encounter: Payer: Self-pay | Admitting: Family Medicine

## 2016-09-01 ENCOUNTER — Ambulatory Visit (INDEPENDENT_AMBULATORY_CARE_PROVIDER_SITE_OTHER): Payer: 59 | Admitting: Family Medicine

## 2016-09-01 ENCOUNTER — Other Ambulatory Visit: Payer: Self-pay | Admitting: Family Medicine

## 2016-09-01 ENCOUNTER — Encounter: Payer: Self-pay | Admitting: Cardiology

## 2016-09-01 VITALS — BP 90/62 | HR 64 | Temp 97.9°F | Resp 16 | Ht 68.5 in | Wt 187.5 lb

## 2016-09-01 DIAGNOSIS — Z0000 Encounter for general adult medical examination without abnormal findings: Secondary | ICD-10-CM | POA: Diagnosis not present

## 2016-09-01 DIAGNOSIS — Z114 Encounter for screening for human immunodeficiency virus [HIV]: Secondary | ICD-10-CM

## 2016-09-01 DIAGNOSIS — Z125 Encounter for screening for malignant neoplasm of prostate: Secondary | ICD-10-CM

## 2016-09-01 LAB — CBC WITH DIFFERENTIAL/PLATELET
BASOS ABS: 75 {cells}/uL (ref 0–200)
Basophils Relative: 1 %
EOS PCT: 3 %
Eosinophils Absolute: 225 cells/uL (ref 15–500)
HCT: 43.8 % (ref 38.5–50.0)
Hemoglobin: 14.8 g/dL (ref 13.2–17.1)
LYMPHS PCT: 28 %
Lymphs Abs: 2100 cells/uL (ref 850–3900)
MCH: 30.6 pg (ref 27.0–33.0)
MCHC: 33.8 g/dL (ref 32.0–36.0)
MCV: 90.7 fL (ref 80.0–100.0)
MONOS PCT: 9 %
MPV: 9.3 fL (ref 7.5–12.5)
Monocytes Absolute: 675 cells/uL (ref 200–950)
NEUTROS PCT: 59 %
Neutro Abs: 4425 cells/uL (ref 1500–7800)
PLATELETS: 195 10*3/uL (ref 140–400)
RBC: 4.83 MIL/uL (ref 4.20–5.80)
RDW: 13.8 % (ref 11.0–15.0)
WBC: 7.5 10*3/uL (ref 3.8–10.8)

## 2016-09-01 LAB — LIPID PANEL
CHOLESTEROL: 94 mg/dL (ref <200)
HDL: 27 mg/dL — ABNORMAL LOW (ref >40)
LDL Cholesterol: 43 mg/dL (ref <100)
Total CHOL/HDL Ratio: 3.5 Ratio (ref <5.0)
Triglycerides: 120 mg/dL (ref <150)
VLDL: 24 mg/dL (ref <30)

## 2016-09-01 LAB — PSA: PSA: 10.1 ng/mL — ABNORMAL HIGH (ref <=4.0)

## 2016-09-01 LAB — COMPREHENSIVE METABOLIC PANEL
ALK PHOS: 72 U/L (ref 40–115)
ALT: 42 U/L (ref 9–46)
AST: 35 U/L (ref 10–35)
Albumin: 3.9 g/dL (ref 3.6–5.1)
BILIRUBIN TOTAL: 1 mg/dL (ref 0.2–1.2)
BUN: 12 mg/dL (ref 7–25)
CALCIUM: 9.3 mg/dL (ref 8.6–10.3)
CO2: 23 mmol/L (ref 20–31)
Chloride: 106 mmol/L (ref 98–110)
Creat: 1.03 mg/dL (ref 0.70–1.33)
Glucose, Bld: 100 mg/dL — ABNORMAL HIGH (ref 65–99)
Potassium: 4.4 mmol/L (ref 3.5–5.3)
Sodium: 140 mmol/L (ref 135–146)
Total Protein: 6.5 g/dL (ref 6.1–8.1)

## 2016-09-01 LAB — TSH: TSH: 1.74 m[IU]/L (ref 0.40–4.50)

## 2016-09-01 NOTE — Patient Instructions (Signed)
 Health Maintenance, Male A healthy lifestyle and preventive care is important for your health and wellness. Ask your health care provider about what schedule of regular examinations is right for you. What should I know about weight and diet?  Eat a Healthy Diet  Eat plenty of vegetables, fruits, whole grains, low-fat dairy products, and lean protein.  Do not eat a lot of foods high in solid fats, added sugars, or salt. Maintain a Healthy Weight  Regular exercise can help you achieve or maintain a healthy weight. You should:  Do at least 150 minutes of exercise each week. The exercise should increase your heart rate and make you sweat (moderate-intensity exercise).  Do strength-training exercises at least twice a week. Watch Your Levels of Cholesterol and Blood Lipids  Have your blood tested for lipids and cholesterol every 5 years starting at 60 years of age. If you are at high risk for heart disease, you should start having your blood tested when you are 60 years old. You may need to have your cholesterol levels checked more often if:  Your lipid or cholesterol levels are high.  You are older than 60 years of age.  You are at high risk for heart disease. What should I know about cancer screening? Many types of cancers can be detected early and may often be prevented. Lung Cancer  You should be screened every year for lung cancer if:  You are a current smoker who has smoked for at least 30 years.  You are a former smoker who has quit within the past 15 years.  Talk to your health care provider about your screening options, when you should start screening, and how often you should be screened. Colorectal Cancer  Routine colorectal cancer screening usually begins at 60 years of age and should be repeated every 5-10 years until you are 60 years old. You may need to be screened more often if early forms of precancerous polyps or small growths are found. Your health care provider  may recommend screening at an earlier age if you have risk factors for colon cancer.  Your health care provider may recommend using home test kits to check for hidden blood in the stool.  A small camera at the end of a tube can be used to examine your colon (sigmoidoscopy or colonoscopy). This checks for the earliest forms of colorectal cancer. Prostate and Testicular Cancer  Depending on your age and overall health, your health care provider may do certain tests to screen for prostate and testicular cancer.  Talk to your health care provider about any symptoms or concerns you have about testicular or prostate cancer. Skin Cancer  Check your skin from head to toe regularly.  Tell your health care provider about any new moles or changes in moles, especially if:  There is a change in a mole's size, shape, or color.  You have a mole that is larger than a pencil eraser.  Always use sunscreen. Apply sunscreen liberally and repeat throughout the day.  Protect yourself by wearing long sleeves, pants, a wide-brimmed hat, and sunglasses when outside. What should I know about heart disease, diabetes, and high blood pressure?  If you are 18-39 years of age, have your blood pressure checked every 3-5 years. If you are 40 years of age or older, have your blood pressure checked every year. You should have your blood pressure measured twice-once when you are at a hospital or clinic, and once when you are not at   a hospital or clinic. Record the average of the two measurements. To check your blood pressure when you are not at a hospital or clinic, you can use:  An automated blood pressure machine at a pharmacy.  A home blood pressure monitor.  Talk to your health care provider about your target blood pressure.  If you are between 45-79 years old, ask your health care provider if you should take aspirin to prevent heart disease.  Have regular diabetes screenings by checking your fasting blood sugar  level.  If you are at a normal weight and have a low risk for diabetes, have this test once every three years after the age of 45.  If you are overweight and have a high risk for diabetes, consider being tested at a younger age or more often.  A one-time screening for abdominal aortic aneurysm (AAA) by ultrasound is recommended for men aged 65-75 years who are current or former smokers. What should I know about preventing infection? Hepatitis B  If you have a higher risk for hepatitis B, you should be screened for this virus. Talk with your health care provider to find out if you are at risk for hepatitis B infection. Hepatitis C  Blood testing is recommended for:  Everyone born from 1945 through 1965.  Anyone with known risk factors for hepatitis C. Sexually Transmitted Diseases (STDs)  You should be screened each year for STDs including gonorrhea and chlamydia if:  You are sexually active and are younger than 60 years of age.  You are older than 60 years of age and your health care provider tells you that you are at risk for this type of infection.  Your sexual activity has changed since you were last screened and you are at an increased risk for chlamydia or gonorrhea. Ask your health care provider if you are at risk.  Talk with your health care provider about whether you are at high risk of being infected with HIV. Your health care provider may recommend a prescription medicine to help prevent HIV infection. What else can I do?  Schedule regular health, dental, and eye exams.  Stay current with your vaccines (immunizations).  Do not use any tobacco products, such as cigarettes, chewing tobacco, and e-cigarettes. If you need help quitting, ask your health care provider.  Limit alcohol intake to no more than 2 drinks per day. One drink equals 12 ounces of beer, 5 ounces of wine, or 1 ounces of hard liquor.  Do not use street drugs.  Do not share needles.  Ask your health  care provider for help if you need support or information about quitting drugs.  Tell your health care provider if you often feel depressed.  Tell your health care provider if you have ever been abused or do not feel safe at home. This information is not intended to replace advice given to you by your health care provider. Make sure you discuss any questions you have with your health care provider. Document Released: 11/19/2007 Document Revised: 01/20/2016 Document Reviewed: 02/24/2015 Elsevier Interactive Patient Education  2017 Elsevier Inc.  

## 2016-09-01 NOTE — Progress Notes (Signed)
Office Note 09/01/2016  CC:  Chief Complaint  Patient presents with  . Annual Exam    Pt is fasting.    HPI:  Ricky Chavez is a 61 y.o. male who is here for annual health maintenance exam. Has a couple of complaints to mention--he'll return to address these fully--see ROS.  Eye exam: last was 3-4 yrs ago. Dental preventative visits UTD. Exercise: usually does if warm. Diet: trying to increase veggies/fruits, increasing lean meats.  BP: home monitoring rarely, when he feels dizziness.  Rarely feels this.  Recently went to [place] on mission trip: working with Loss adjuster, chartered.  Past Medical History:  Diagnosis Date  . Allergic rhinitis   . BPH with obstruction/lower urinary tract symptoms   . Coronary artery disease    DAPT lifetime due to high risk status.  . Elevated PSA onset @ 2009   Neg bx x 3, most recent 2009 ([facility] [place]).  PSA stable at 3.98 12/01/14 and 4.61 on 07/25/15 per old records.  PSA 10 at Dr. Noland Fordyce ([facility]) 03/2016: flomax started and plan is to recheck PSA and f/u with urol in 6 mo.  Marland Kitchen GERD (gastroesophageal reflux disease)   . Hearing loss of both ears   . History of myocardial infarction 2011  . Hyperlipidemia   . Hypertension   . Insomnia   . Organic impotence   . Squamous cell carcinoma     Past Surgical History:  Procedure Laterality Date  . CARDIAC CATHETERIZATION  01/31/13   Stents to LAD patent; small diagonal stent with 90% ostial stenosis--good overall LV function with mild anterior and focal inferoapical hypokinesis, EF 60-65%.  Unsuccessful angioplasty of diagonal.  . CARDIOVASCULAR STRESS TEST  05/26/15   stress echo neg for ischemia  . COLONOSCOPY  2008; 06/14/16   2008 Normal.  2018 polyps x 2, path pending. +internal hemorrhoids.  Marland Kitchen HYDROCELE EXCISION / REPAIR  early 2000's  . INGUINAL HERNIA REPAIR Bilateral 1998  . PROSTATE BIOPSY     Multiple; all neg  . PTCA     Cath 2011---4 stents--LAD and  diagonal.  2014 repeat cath for CP showed no changes.  . Scrotal u/s  01/2015   numerous extratesticular cystic lesions bilat, c/w numerous epididymal cysts vs loculated hydrocele--following expectantly.    Family History  Problem Relation Age of Onset  . Hyperlipidemia Mother   . Hypertension Mother   . Heart disease Mother 65    CABG  . Heart disease Father 28    CABG  . Hyperlipidemia Father   . Hypertension Father   . Colon polyps Father   . Hypothyroidism Sister   . Heart attack Brother 54    MI  . Prostate cancer Paternal Uncle   . Peripheral vascular disease Brother     Carotid   . Stroke Neg Hx   . Diabetes Neg Hx     Social History   Social History  . Marital status: Married    Spouse name: N/A  . Number of children: 2  . Years of education: N/A   Occupational History  . retired    Social History Main Topics  . Smoking status: Former Smoker    Packs/day: 0.25    Years: 2.00    Types: Cigarettes    Quit date: 06/06/1978  . Smokeless tobacco: Never Used  . Alcohol use 8.4 oz/week    14 Glasses of wine per week  . Drug use: No  . Sexual activity: Not on  file   Other Topics Concern  . Not on file   Social History Narrative   Married, 2 grown children.   Relocated to [place] from [facility] 2016.   Educ: Masters degree   Occup: Retired Government social research officer with [employer]   No tob.   Alc: 1 glass wine/night.    Outpatient Medications Prior to Visit  Medication Sig Dispense Refill  . aspirin EC 81 MG tablet Take 1 tablet by mouth daily.    Marland Kitchen atorvastatin (LIPITOR) 40 MG tablet Take 1 tablet by mouth daily.    . carvedilol (COREG) 6.25 MG tablet Take 1 tablet (6.25 mg total) by mouth 2 (two) times daily. 180 tablet 3  . lisinopril (PRINIVIL,ZESTRIL) 5 MG tablet Take 1 tablet (5 mg total) by mouth daily. 90 tablet 3  . Melatonin (MELATONIN MAXIMUM STRENGTH) 5 MG TABS Take 1 tablet by mouth at bedtime.    . Multiple Vitamins-Minerals (MULTIVITAMIN ADULT PO)  Take 1 capsule by mouth daily.    . nitroGLYCERIN (NITROSTAT) 0.4 MG SL tablet Place 1 tablet (0.4 mg total) under the tongue every 5 (five) minutes as needed. As needed for chest pain mas of 3 doses in 15 minutes. 25 tablet 3  . omeprazole (PRILOSEC) 20 MG capsule Take 1 capsule by mouth daily.    . tamsulosin (FLOMAX) 0.4 MG CAPS capsule Take 0.4 mg by mouth daily.    . ticagrelor (BRILINTA) 60 MG TABS tablet Take 1 tablet (60 mg total) by mouth 2 (two) times daily. 180 tablet 3  . vardenafil (LEVITRA) 20 MG tablet Take 1 tablet by mouth daily as needed.     Facility-Administered Medications Prior to Visit  Medication Dose Route Frequency Provider Last Rate Last Dose  . 0.9 %  sodium chloride infusion  500 mL Intravenous Continuous Milus Banister, MD        No Known Allergies  ROS Review of Systems  Constitutional: Negative for appetite change, chills, fatigue and fever.  HENT: Negative for congestion, dental problem, ear pain and sore throat.   Eyes: Negative for discharge, redness and visual disturbance.  Respiratory: Negative for cough, chest tightness, shortness of breath and wheezing.   Cardiovascular: Negative for chest pain, palpitations and leg swelling.  Gastrointestinal: Negative for abdominal pain, blood in stool, diarrhea, nausea and vomiting.  Genitourinary: Negative for difficulty urinating, dysuria, flank pain, frequency, hematuria and urgency.  Musculoskeletal: Positive for arthralgias (Right shoulder pain x 2 mo.). Negative for back pain, joint swelling, myalgias and neck stiffness.       R hip pain radiating to R ankle.  Skin: Negative for pallor and rash.  Neurological: Negative for dizziness, speech difficulty, weakness and headaches.  Hematological: Negative for adenopathy. Does not bruise/bleed easily.  Psychiatric/Behavioral: Negative for confusion and sleep disturbance. The patient is not nervous/anxious.     PE; Blood pressure 90/62, pulse 64, temperature  97.9 F (36.6 C), temperature source Oral, resp. rate 16, height 5' 8.5" (1.74 m), weight 187 lb 8 oz (85 kg), SpO2 97 %. Gen: Alert, well appearing.  Patient is oriented to person, place, time, and situation. AFFECT: pleasant, lucid thought and speech. ENT: Ears: EACs clear, normal epithelium.  TMs with good light reflex and landmarks bilaterally.  Eyes: no injection, icteris, swelling, or exudate.  EOMI, PERRLA. Nose: no drainage or turbinate edema/swelling.  No injection or focal lesion.  Mouth: lips without lesion/swelling.  Oral mucosa pink and moist.  Dentition intact and without obvious caries or gingival swelling.  Oropharynx without erythema, exudate, or swelling.  Neck: supple/nontender.  No LAD, mass, or TM.  Carotid pulses 2+ bilaterally, without bruits. CV: RRR, no m/r/g.   LUNGS: CTA bilat, nonlabored resps, good aeration in all lung fields. ABD: soft, NT, ND, BS normal.  No hepatospenomegaly or mass.  No bruits. EXT: no clubbing, cyanosis, or edema.  Musculoskeletal: no joint swelling, erythema, warmth, or tenderness.  ROM of all joints intact. Skin - no sores or suspicious lesions or rashes or color changes Rectal exam: negative without mass, lesions or tenderness, PROSTATE EXAM: smooth and symmetric without nodules or tenderness.  Pertinent labs:  None (none in EMR as well)  ASSESSMENT AND PLAN:   Health maintenance exam: Reviewed age and gender appropriate health maintenance issues (prudent diet, regular exercise, health risks of tobacco and excessive alcohol, use of seatbelts, fire alarms in home, use of sunscreen).  Also reviewed age and gender appropriate health screening as well as vaccine recommendations. Vaccines UTD. Fasting HP + HIV screening today. Prostate ca screening: DRE normal, PSA drawn today. Colon cancer screening: most recent colonoscopy 06/2016 (hyperplastic polyp)--recall 07/2026.  An After Visit Summary was printed and given to the patient.  FOLLOW  UP:  Return Appt at patient's convenience to address shoulder pain and sciatica sx's.   Signed:  Crissie Sickles, MD           09/01/2016

## 2016-09-01 NOTE — Progress Notes (Signed)
Pre visit review using our clinic review tool, if applicable. No additional management support is needed unless otherwise documented below in the visit note. 

## 2016-09-02 ENCOUNTER — Other Ambulatory Visit: Payer: Self-pay | Admitting: *Deleted

## 2016-09-02 LAB — HIV ANTIBODY (ROUTINE TESTING W REFLEX): HIV 1&2 Ab, 4th Generation: NONREACTIVE

## 2016-09-02 MED ORDER — TICAGRELOR 60 MG PO TABS
60.0000 mg | ORAL_TABLET | Freq: Two times a day (BID) | ORAL | 3 refills | Status: DC
Start: 2016-09-02 — End: 2017-08-30

## 2016-09-02 NOTE — Telephone Encounter (Signed)
Rx has been sent to the pharmacy electronically. Brilinta

## 2016-09-04 ENCOUNTER — Encounter: Payer: Self-pay | Admitting: Family Medicine

## 2016-09-05 ENCOUNTER — Other Ambulatory Visit: Payer: Self-pay | Admitting: Family Medicine

## 2016-09-05 ENCOUNTER — Other Ambulatory Visit: Payer: 59

## 2016-09-05 ENCOUNTER — Encounter: Payer: Self-pay | Admitting: *Deleted

## 2016-09-05 DIAGNOSIS — Z1159 Encounter for screening for other viral diseases: Secondary | ICD-10-CM

## 2016-09-05 LAB — HEPATITIS C ANTIBODY: HCV AB: NEGATIVE

## 2016-09-05 MED ORDER — ATORVASTATIN CALCIUM 40 MG PO TABS: 40.0000 mg | ORAL_TABLET | Freq: Every day | ORAL | 3 refills | Status: DC

## 2016-09-26 LAB — PSA: PSA: 9.2

## 2016-09-28 ENCOUNTER — Encounter: Payer: Self-pay | Admitting: Family Medicine

## 2016-09-28 ENCOUNTER — Ambulatory Visit (INDEPENDENT_AMBULATORY_CARE_PROVIDER_SITE_OTHER): Payer: 59 | Admitting: Family Medicine

## 2016-09-28 VITALS — BP 112/70 | HR 68 | Temp 97.7°F | Resp 16 | Ht 68.5 in | Wt 193.2 lb

## 2016-09-28 DIAGNOSIS — M5431 Sciatica, right side: Secondary | ICD-10-CM | POA: Diagnosis not present

## 2016-09-28 MED ORDER — PREDNISONE 20 MG PO TABS: ORAL_TABLET | ORAL | 0 refills | Status: DC

## 2016-09-28 MED ORDER — ATORVASTATIN CALCIUM 40 MG PO TABS: 40.0000 mg | ORAL_TABLET | Freq: Every day | ORAL | 3 refills | Status: DC

## 2016-09-28 MED ORDER — CARVEDILOL 6.25 MG PO TABS: 6.2500 mg | ORAL_TABLET | Freq: Two times a day (BID) | ORAL | 3 refills | Status: DC

## 2016-09-28 NOTE — Patient Instructions (Signed)
Low Back Sprain Rehab  Ask your health care provider which exercises are safe for you. Do exercises exactly as told by your health care provider and adjust them as directed. It is normal to feel mild stretching, pulling, tightness, or discomfort as you do these exercises, but you should stop right away if you feel sudden pain or your pain gets worse. Do not begin these exercises until told by your health care provider.  Stretching and range of motion exercises  These exercises warm up your muscles and joints and improve the movement and flexibility of your back. These exercises also help to relieve pain, numbness, and tingling.  Exercise A: Lumbar rotation     1. Lie on your back on a firm surface and bend your knees.  2. Straighten your arms out to your sides so each arm forms an "L" shape with a side of your body (a 90 degree angle).  3. Slowly move both of your knees to one side of your body until you feel a stretch in your lower back. Try not to let your shoulders move off of the floor.  4. Hold for __________ seconds.  5. Tense your abdominal muscles and slowly move your knees back to the starting position.  6. Repeat this exercise on the other side of your body.  Repeat __________ times. Complete this exercise __________ times a day.  Exercise B: Prone extension on elbows     1. Lie on your abdomen on a firm surface.  2. Prop yourself up on your elbows.  3. Use your arms to help lift your chest up until you feel a gentle stretch in your abdomen and your lower back.  ? This will place some of your body weight on your elbows. If this is uncomfortable, try stacking pillows under your chest.  ? Your hips should stay down, against the surface that you are lying on. Keep your hip and back muscles relaxed.  4. Hold for __________ seconds.  5. Slowly relax your upper body and return to the starting position.  Repeat __________ times. Complete this exercise __________ times a day.  Strengthening exercises  These  exercises build strength and endurance in your back. Endurance is the ability to use your muscles for a long time, even after they get tired.  Exercise C: Pelvic tilt   1. Lie on your back on a firm surface. Bend your knees and keep your feet flat.  2. Tense your abdominal muscles. Tip your pelvis up toward the ceiling and flatten your lower back into the floor.  ? To help with this exercise, you may place a small towel under your lower back and try to push your back into the towel.  3. Hold for __________ seconds.  4. Let your muscles relax completely before you repeat this exercise.  Repeat __________ times. Complete this exercise __________ times a day.  Exercise D: Alternating arm and leg raises     1. Get on your hands and knees on a firm surface. If you are on a hard floor, you may want to use padding to cushion your knees, such as an exercise mat.  2. Line up your arms and legs. Your hands should be below your shoulders, and your knees should be below your hips.  3. Lift your left leg behind you. At the same time, raise your right arm and straighten it in front of you.  ? Do not lift your leg higher than your hip.  ? Do   not lift your arm higher than your shoulder.  ? Keep your abdominal and back muscles tight.  ? Keep your hips facing the ground.  ? Do not arch your back.  ? Keep your balance carefully, and do not hold your breath.  4. Hold for __________ seconds.  5. Slowly return to the starting position and repeat with your right leg and your left arm.  Repeat __________ times. Complete this exercise __________ times a day.  Exercise E: Abdominal set with straight leg raise     1. Lie on your back on a firm surface.  2. Bend one of your knees and keep your other leg straight.  3. Tense your abdominal muscles and lift your straight leg up, 4-6 inches (10-15 cm) off the ground.  4. Keep your abdominal muscles tight and hold for __________ seconds.  ? Do not hold your breath.  ? Do not arch your back. Keep it  flat against the ground.  5. Keep your abdominal muscles tense as you slowly lower your leg back to the starting position.  6. Repeat with your other leg.  Repeat __________ times. Complete this exercise __________ times a day.  Posture and body mechanics     Body mechanics refers to the movements and positions of your body while you do your daily activities. Posture is part of body mechanics. Good posture and healthy body mechanics can help to relieve stress in your body's tissues and joints. Good posture means that your spine is in its natural S-curve position (your spine is neutral), your shoulders are pulled back slightly, and your head is not tipped forward. The following are general guidelines for applying improved posture and body mechanics to your everyday activities.  Standing     · When standing, keep your spine neutral and your feet about hip-width apart. Keep a slight bend in your knees. Your ears, shoulders, and hips should line up.  · When you do a task in which you stand in one place for a long time, place one foot up on a stable object that is 2-4 inches (5-10 cm) high, such as a footstool. This helps keep your spine neutral.  Sitting     · When sitting, keep your spine neutral and keep your feet flat on the floor. Use a footrest, if necessary, and keep your thighs parallel to the floor. Avoid rounding your shoulders, and avoid tilting your head forward.  · When working at a desk or a computer, keep your desk at a height where your hands are slightly lower than your elbows. Slide your chair under your desk so you are close enough to maintain good posture.  · When working at a computer, place your monitor at a height where you are looking straight ahead and you do not have to tilt your head forward or downward to look at the screen.  Resting     · When lying down and resting, avoid positions that are most painful for you.  · If you have pain with activities such as sitting, bending, stooping, or  squatting (flexion-based activities), lie in a position in which your body does not bend very much. For example, avoid curling up on your side with your arms and knees near your chest (fetal position).  · If you have pain with activities such as standing for a long time or reaching with your arms (extension-based activities), lie with your spine in a neutral position and bend your knees slightly. Try the following   positions:  · Lying on your side with a pillow between your knees.  · Lying on your back with a pillow under your knees.  Lifting     · When lifting objects, keep your feet at least shoulder-width apart and tighten your abdominal muscles.  · Bend your knees and hips and keep your spine neutral. It is important to lift using the strength of your legs, not your back. Do not lock your knees straight out.  · Always ask for help to lift heavy or awkward objects.  This information is not intended to replace advice given to you by your health care provider. Make sure you discuss any questions you have with your health care provider.  Document Released: 05/23/2005 Document Revised: 01/28/2016 Document Reviewed: 03/04/2015  Elsevier Interactive Patient Education © 2017 [employer] Inc.

## 2016-09-28 NOTE — Progress Notes (Signed)
OFFICE VISIT  09/28/2016   CC:  Chief Complaint  Patient presents with  . Shoulder Pain    right shoulder injury in July  . Hip Pain    right x 1 year   HPI:    Patient is a 60 y.o.  male who presents for right hip pain, onset about 1 yr ago. The pain comes mostly upon standing, starts in back of R upper hip region/glut region, radiates down back of leg of hip/thigh and then lateral leg down to R ankle region.  No paresthesias.  No leg weakness.  Leaning lateral to left at L spine relieves this some. No trauma or injury came prior to the onset of this pain. Describes hx of muscular low back pain, but none now.   Has not tried any med for the pain.  He has tried to do some stretches for glut area and it has helped.  Past Medical History:  Diagnosis Date  . Allergic rhinitis   . BPH with obstruction/lower urinary tract symptoms   . Coronary artery disease    DAPT lifetime due to high risk status.  . Elevated PSA onset @ 2009   Neg bx x 3, most recent 2009 ([facility] in [place]).  PSA stable at 3.98 12/01/14 and 4.61 on 07/25/15 per old records.  PSA 10 at Dr. Noland Fordyce ([facility]) 03/2016: flomax started and plan is to recheck PSA and f/u with urol in 6 mo.  Recheck PSA here 08/2016 was 10.1.  Marland Kitchen GERD (gastroesophageal reflux disease)   . Hearing loss of both ears   . History of myocardial infarction 2011  . Hyperlipidemia   . Hypertension   . Insomnia   . Organic impotence   . Squamous cell carcinoma     Past Surgical History:  Procedure Laterality Date  . CARDIAC CATHETERIZATION  01/31/13   Stents to LAD patent; small diagonal stent with 90% ostial stenosis--good overall LV function with mild anterior and focal inferoapical hypokinesis, EF 60-65%.  Unsuccessful angioplasty of diagonal.  . CARDIOVASCULAR STRESS TEST  05/26/15   stress echo neg for ischemia  . COLONOSCOPY  2008; 06/14/16   2008 Normal.  2018 polyps x 2, path pending. +internal hemorrhoids.  Marland Kitchen HYDROCELE  EXCISION / REPAIR  early 2000's  . INGUINAL HERNIA REPAIR Bilateral 1998  . PROSTATE BIOPSY     Multiple; all neg  . PTCA     Cath 2011---4 stents--LAD and diagonal.  2014 repeat cath for CP showed no changes.  . Scrotal u/s  01/2015   numerous extratesticular cystic lesions bilat, c/w numerous epididymal cysts vs loculated hydrocele--following expectantly.    Outpatient Medications Prior to Visit  Medication Sig Dispense Refill  . aspirin EC 81 MG tablet Take 1 tablet by mouth daily.    Marland Kitchen lisinopril (PRINIVIL,ZESTRIL) 5 MG tablet Take 1 tablet (5 mg total) by mouth daily. 90 tablet 3  . Melatonin (MELATONIN MAXIMUM STRENGTH) 5 MG TABS Take 1 tablet by mouth at bedtime.    . Multiple Vitamins-Minerals (MULTIVITAMIN ADULT PO) Take 1 capsule by mouth daily.    . nitroGLYCERIN (NITROSTAT) 0.4 MG SL tablet Place 1 tablet (0.4 mg total) under the tongue every 5 (five) minutes as needed. As needed for chest pain mas of 3 doses in 15 minutes. 25 tablet 3  . omeprazole (PRILOSEC) 20 MG capsule Take 1 capsule by mouth daily.    . tamsulosin (FLOMAX) 0.4 MG CAPS capsule Take 0.4 mg by mouth daily.    Marland Kitchen  ticagrelor (BRILINTA) 60 MG TABS tablet Take 1 tablet (60 mg total) by mouth 2 (two) times daily. 180 tablet 3  . atorvastatin (LIPITOR) 40 MG tablet Take 1 tablet (40 mg total) by mouth daily. 90 tablet 3  . carvedilol (COREG) 6.25 MG tablet Take 1 tablet (6.25 mg total) by mouth 2 (two) times daily. 180 tablet 3  . vardenafil (LEVITRA) 20 MG tablet Take 1 tablet by mouth daily as needed.     Facility-Administered Medications Prior to Visit  Medication Dose Route Frequency Provider Last Rate Last Dose  . 0.9 %  sodium chloride infusion  500 mL Intravenous Continuous Milus Banister, MD        No Known Allergies  ROS As per HPI  PE: Blood pressure 112/70, pulse 68, temperature 97.7 F (36.5 C), temperature source Oral, resp. rate 16, height 5' 8.5" (1.74 m), weight 193 lb 4 oz (87.7 kg), SpO2  96 %. Gen: Alert, well appearing.  Patient is oriented to person, place, time, and situation. AFFECT: pleasant, lucid thought and speech. BACK EXAM:  Patient moves about the exam room without particular difficulty or stooped posture. Spine appears straight, back without bruising or other skin abnormality. Pt demonstrates normal ROM of L spine in forward flexion, extension, lateral flexion, and rotation, with no pain upon ROM. Palpation of back reveals no tenderness in paraspinous soft tissues, facet joint regions, and over spinous processes.   SLR neg bilaterally.  LE strength 5/5 in proximal and distal muscles bilaterally. DTRs: 2+ patellar, 2+ achilles, bilaterally.  Babinsky is downgoing bilaterally.    LABS:  none  IMPRESSION AND PLAN:  1) Right sciatica/piriformis syndrome. Prednisone 40mg  qd x 5d, then 20mg  qd x 5d. Home stretching exercises discussed and printed for him. If no improvement with steroids and home stretching then next step is formal PT.  He'll return for separate appt to address shoulder complaint.  An After Visit Summary was printed and given to the patient.  FOLLOW UP: Return if symptoms worsen or fail to improve.  Signed:  Crissie Sickles, MD           09/28/2016

## 2016-09-28 NOTE — Progress Notes (Signed)
Pre visit review using our clinic review tool, if applicable. No additional management support is needed unless otherwise documented below in the visit note. 

## 2016-09-30 ENCOUNTER — Encounter: Payer: Self-pay | Admitting: Family Medicine

## 2016-10-02 NOTE — Progress Notes (Signed)
Cardiology Office Note   Date:  10/03/2016   ID:  Ricky Chavez, DOB 07/20/1955, MRN [id number]  PCP:  [name], MD  Cardiologist:   Minus Breeding, MD  Referring:  [name], MD  Chief Complaint  Patient presents with  . Coronary Artery Disease      History of Present Illness: Ricky Chavez is a 61 y.o. male who presents for follow up of CAD.  The patient was treated at [facility] for CAD in late 2016.  He had stents to LAD patent; small diagonal stent with 90% ostial stenosis--good overall LV function with mild anterior and focal inferoapical hypokinesis, EF 60-65%. Unsuccessful angioplasty of the diagonal.  He had a stress echo in 2016 December that was negative for any evidence of ischemia. The patient had a complicated course with his initial angioplasty.  Since I last saw him he has done well.  The patient denies any new symptoms such as chest discomfort, neck or arm discomfort. There has been no new shortness of breath, PND or orthopnea. There have been no reported palpitations, presyncope or syncope.   Past Medical History:  Diagnosis Date  . Allergic rhinitis   . BPH with obstruction/lower urinary tract symptoms   . Coronary artery disease    DAPT lifetime due to high risk status.  . Elevated PSA onset @ 2009   Neg bx x 3, most recent 2009 ([facility] in [place]).  PSA stable at 3.98 12/01/14 and 4.61 on 07/25/15 per old records.  PSA 10 at Dr. Noland Fordyce ([facility]) 03/2016: flomax started and plan is to recheck PSA and f/u with urol in 6 mo.  Recheck PSA here 08/2016 was 10.1, then 9.2 on 08/30/16.  Marland Kitchen GERD (gastroesophageal reflux disease)   . Hearing loss of both ears   . History of myocardial infarction 2011  . Hyperlipidemia   . Hypertension   . Insomnia   . Organic impotence   . Squamous cell carcinoma     Past Surgical History:  Procedure Laterality Date  . CARDIAC CATHETERIZATION  01/31/13   Stents to LAD patent; small diagonal  stent with 90% ostial stenosis--good overall LV function with mild anterior and focal inferoapical hypokinesis, EF 60-65%.  Unsuccessful angioplasty of diagonal.  . CARDIOVASCULAR STRESS TEST  05/26/15   stress echo neg for ischemia  . COLONOSCOPY  2008; 06/14/16   2008 Normal.  2018 polyps x 2, path pending. +internal hemorrhoids.  Marland Kitchen HYDROCELE EXCISION / REPAIR  early 2000's  . INGUINAL HERNIA REPAIR Bilateral 1998  . PROSTATE BIOPSY     Multiple; all neg  . PTCA     Cath 2011---4 stents--LAD and diagonal.  2014 repeat cath for CP showed no changes.  . Scrotal u/s  01/2015   numerous extratesticular cystic lesions bilat, c/w numerous epididymal cysts vs loculated hydrocele--following expectantly.     Current Outpatient Prescriptions  Medication Sig Dispense Refill  . aspirin EC 81 MG tablet Take 1 tablet by mouth daily.    Marland Kitchen atorvastatin (LIPITOR) 40 MG tablet Take 1 tablet (40 mg total) by mouth daily. 90 tablet 3  . carvedilol (COREG) 6.25 MG tablet Take 1 tablet (6.25 mg total) by mouth 2 (two) times daily. 180 tablet 3  . lisinopril (PRINIVIL,ZESTRIL) 5 MG tablet Take 1 tablet (5 mg total) by mouth daily. 90 tablet 3  . Melatonin (MELATONIN MAXIMUM STRENGTH) 5 MG TABS Take 1 tablet by mouth at bedtime.    . Multiple Vitamins-Minerals (MULTIVITAMIN ADULT  PO) Take 1 capsule by mouth daily.    . nitroGLYCERIN (NITROSTAT) 0.4 MG SL tablet Place 1 tablet (0.4 mg total) under the tongue every 5 (five) minutes as needed. As needed for chest pain mas of 3 doses in 15 minutes. 25 tablet 3  . omeprazole (PRILOSEC) 20 MG capsule Take 1 capsule by mouth daily.    . predniSONE (DELTASONE) 20 MG tablet 2 tabs po qd x 5d, then 1 tab po qd x 5d 15 tablet 0  . tamsulosin (FLOMAX) 0.4 MG CAPS capsule Take 0.4 mg by mouth daily.    . ticagrelor (BRILINTA) 60 MG TABS tablet Take 1 tablet (60 mg total) by mouth 2 (two) times daily. 180 tablet 3  . vardenafil (LEVITRA) 20 MG tablet Take 1 tablet by mouth  daily as needed.     Current Facility-Administered Medications  Medication Dose Route Frequency Provider Last Rate Last Dose  . 0.9 %  sodium chloride infusion  500 mL Intravenous Continuous Milus Banister, MD        Allergies:   Patient has no known allergies.    ROS:  Please see the history of present illness.   Otherwise, review of systems are positive for easy bruising.   All other systems are reviewed and negative.    PHYSICAL EXAM: VS:  BP 110/72 (BP Location: Right Arm, Patient Position: Sitting, Cuff Size: Normal)   Pulse 60   Ht 5' 8.5" (1.74 m)   Wt 187 lb 3.2 oz (84.9 kg)   BMI 28.05 kg/m  , BMI Body mass index is 28.05 kg/m. GENERAL:  Well appearing, and in no distress NECK:  No jugular venous distention, waveform within normal limits, carotid upstroke brisk and symmetric, no bruits, no thyromegaly LUNGS:  Clear to auscultation bilaterally BACK:  No CVA tenderness CHEST:  Unremarkable HEART:  PMI not displaced or sustained,S1 and S2 within normal limits, no S3, no S4, no clicks, no rubs, no murmurs ABD:  Flat, positive bowel sounds normal in frequency in pitch, no bruits, no rebound, no guarding, no midline pulsatile mass, no hepatomegaly, no splenomegaly EXT:  2 plus pulses throughout, no edema, no cyanosis no clubbing SKIN:  Slight ecchymosis left forearm   EKG:  EKG is not  ordered today.    Recent Labs: 09/01/2016: ALT 42; BUN 12; Creat 1.03; Hemoglobin 14.8; Platelets 195; Potassium 4.4; Sodium 140; TSH 1.74    Lipid Panel    Component Value Date/Time   CHOL 94 09/01/2016 0925   TRIG 120 09/01/2016 0925   HDL 27 (L) 09/01/2016 0925   CHOLHDL 3.5 09/01/2016 0925   VLDL 24 09/01/2016 0925   LDLCALC 43 09/01/2016 0925      Wt Readings from Last 3 Encounters:  10/03/16 187 lb 3.2 oz (84.9 kg)  09/28/16 193 lb 4 oz (87.7 kg)  09/01/16 187 lb 8 oz (85 kg)      Other studies Reviewed: Additional studies/ records that were reviewed today include:  None Review of the above records demonstrates:    ASSESSMENT AND PLAN:    CAD:  The patient has  no new sypmtoms.  No further cardiovascular testing is indicated.  We will continue with aggressive risk reduction and meds as listed.  Of note it was suggested that he should continue on life long DAPT.  I will follow up with repeat POET (Plain Old Exercise Treadmill)s in the future.  DYSLIPIDEMIA:  He has an excellent LDL although the HDL is low.  He will continue the current therapy.  HTN:  The blood pressure is  at target. No change in medications is indicated. We will continue with therapeutic lifestyle changes (TLC).   Current medicines are reviewed at length with the patient today.  The patient does not have concerns regarding medicines.  The following changes have been made:  no change  Labs/ tests ordered today include: None  No orders of the defined types were placed in this encounter.    Disposition:   FU with me in 12 months.     Signed, Minus Breeding, MD  10/03/2016 9:23 PM    [place]

## 2016-10-03 ENCOUNTER — Ambulatory Visit (INDEPENDENT_AMBULATORY_CARE_PROVIDER_SITE_OTHER): Payer: 59 | Admitting: Cardiology

## 2016-10-03 ENCOUNTER — Encounter: Payer: Self-pay | Admitting: Cardiology

## 2016-10-03 VITALS — BP 110/72 | HR 60 | Ht 68.5 in | Wt 187.2 lb

## 2016-10-03 DIAGNOSIS — I2510 Atherosclerotic heart disease of native coronary artery without angina pectoris: Secondary | ICD-10-CM | POA: Diagnosis not present

## 2016-10-03 DIAGNOSIS — I10 Essential (primary) hypertension: Secondary | ICD-10-CM | POA: Diagnosis not present

## 2016-10-03 DIAGNOSIS — E785 Hyperlipidemia, unspecified: Secondary | ICD-10-CM | POA: Diagnosis not present

## 2016-10-03 NOTE — Patient Instructions (Signed)
Medication Instructions: No changes  Follow-Up: Your physician wants you to follow-up in: ONE YEAR WITH DR. HOCHREIN You will receive a reminder letter in the mail two months in advance. If you don't receive a letter, please call our office to schedule the follow-up appointment.    If you need a refill on your cardiac medications before your next appointment, please call your pharmacy.   

## 2016-10-04 ENCOUNTER — Encounter: Payer: Self-pay | Admitting: Family Medicine

## 2016-11-05 ENCOUNTER — Encounter: Payer: Self-pay | Admitting: Family Medicine

## 2017-03-06 ENCOUNTER — Encounter: Payer: Self-pay | Admitting: Cardiology

## 2017-03-06 MED ORDER — NITROGLYCERIN 0.4 MG/SPRAY TL SOLN: 1.0000 | 1 refills | Status: DC | PRN

## 2017-03-06 NOTE — Telephone Encounter (Signed)
Leave message on pt voicemail letting him know NTG sl spray went to pharmacy [facility]

## 2017-04-04 ENCOUNTER — Encounter: Payer: Self-pay | Admitting: Family Medicine

## 2017-04-05 ENCOUNTER — Encounter: Payer: Self-pay | Admitting: Family Medicine

## 2017-04-05 ENCOUNTER — Ambulatory Visit (HOSPITAL_BASED_OUTPATIENT_CLINIC_OR_DEPARTMENT_OTHER)
Admission: RE | Admit: 2017-04-05 | Discharge: 2017-04-05 | Disposition: A | Discharge status: Home / Self Care | Payer: 59 | Source: Ambulatory Visit | Attending: Family Medicine | Admitting: Family Medicine

## 2017-04-05 ENCOUNTER — Ambulatory Visit (INDEPENDENT_AMBULATORY_CARE_PROVIDER_SITE_OTHER): Payer: 59 | Admitting: Family Medicine

## 2017-04-05 ENCOUNTER — Ambulatory Visit: Payer: Self-pay | Admitting: Family Medicine

## 2017-04-05 VITALS — BP 105/68 | HR 62 | Temp 98.3°F | Resp 16 | Ht 68.5 in | Wt 186.5 lb

## 2017-04-05 DIAGNOSIS — M47816 Spondylosis without myelopathy or radiculopathy, lumbar region: Secondary | ICD-10-CM | POA: Insufficient documentation

## 2017-04-05 DIAGNOSIS — M5431 Sciatica, right side: Secondary | ICD-10-CM

## 2017-04-05 DIAGNOSIS — M25551 Pain in right hip: Secondary | ICD-10-CM | POA: Diagnosis not present

## 2017-04-05 DIAGNOSIS — G5701 Lesion of sciatic nerve, right lower limb: Secondary | ICD-10-CM | POA: Diagnosis not present

## 2017-04-05 DIAGNOSIS — M8580 Other specified disorders of bone density and structure, unspecified site: Secondary | ICD-10-CM | POA: Diagnosis not present

## 2017-04-05 MED ORDER — GABAPENTIN 100 MG PO CAPS: ORAL_CAPSULE | ORAL | 1 refills | Status: DC
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
## 2017-04-06 ENCOUNTER — Encounter: Payer: Self-pay | Admitting: *Deleted

## 2017-04-10 ENCOUNTER — Encounter: Payer: Self-pay | Admitting: Family Medicine

## 2017-04-10 ENCOUNTER — Other Ambulatory Visit: Payer: Self-pay | Admitting: Urology

## 2017-04-10 DIAGNOSIS — R972 Elevated prostate specific antigen [PSA]: Secondary | ICD-10-CM

## 2017-04-11 NOTE — Telephone Encounter (Signed)
I see that the order has been put in but doesn't look like anything else has been done. Will you please help pt. Thanks.

## 2017-04-13 ENCOUNTER — Ambulatory Visit: Payer: 59 | Admitting: Family Medicine

## 2017-04-13 ENCOUNTER — Encounter: Payer: Self-pay | Admitting: Family Medicine

## 2017-04-13 VITALS — BP 132/68 | HR 68 | Temp 98.3°F | Ht 68.5 in | Wt 185.0 lb

## 2017-04-13 DIAGNOSIS — M5431 Sciatica, right side: Secondary | ICD-10-CM | POA: Diagnosis not present
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
## 2017-04-13 NOTE — Assessment & Plan Note (Signed)
Has had improvement since adjusting the gabapentin. Possible that this is piriformis but could also be coming from his back. His x-ray showed a cam & pincer-type lesion and he intermittently has some anterior groin pain - Continue gabapentin and supportive measures - Counseled on home exercise therapy - Referral to physical therapy - If no improvement consider an MRI of the lumbar spine versus nerve conduction study.

## 2017-04-13 NOTE — Patient Instructions (Addendum)
Thank you for coming in,   I have made a referral to physical therapy.   Please let me know if feel that your symptoms worsen.    Please feel free to call with any questions or concerns at any time, at [phone]. --Dr. Raeford Razor

## 2017-04-17 ENCOUNTER — Encounter: Payer: Self-pay | Admitting: Family Medicine

## 2017-04-18 ENCOUNTER — Other Ambulatory Visit: Payer: Self-pay | Admitting: *Deleted

## 2017-04-18 MED ORDER — LISINOPRIL 5 MG PO TABS: 5.0000 mg | ORAL_TABLET | Freq: Every day | ORAL | 1 refills | Status: DC

## 2017-04-19 ENCOUNTER — Other Ambulatory Visit: Payer: Self-pay

## 2017-04-19 MED ORDER — LISINOPRIL 5 MG PO TABS: 5.0000 mg | ORAL_TABLET | Freq: Every day | ORAL | 1 refills | Status: DC

## 2017-04-20 ENCOUNTER — Encounter: Payer: Self-pay | Admitting: Physical Therapy

## 2017-04-20 ENCOUNTER — Ambulatory Visit: Payer: 59 | Attending: Family Medicine | Admitting: Physical Therapy

## 2017-04-20 DIAGNOSIS — M5441 Lumbago with sciatica, right side: Secondary | ICD-10-CM | POA: Insufficient documentation
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
## 2017-04-25 ENCOUNTER — Ambulatory Visit
Admission: RE | Admit: 2017-04-25 | Discharge: 2017-04-25 | Disposition: A | Discharge status: Home / Self Care | Payer: 59 | Source: Ambulatory Visit | Attending: Urology | Admitting: Urology

## 2017-04-25 DIAGNOSIS — R972 Elevated prostate specific antigen [PSA]: Secondary | ICD-10-CM

## 2017-04-25 MED ORDER — GADOBENATE DIMEGLUMINE 529 MG/ML IV SOLN
17.0000 mL | Freq: Once | INTRAVENOUS | Status: AC | PRN
  Administered 2017-04-25: 17 mL via INTRAVENOUS

## 2017-05-02 ENCOUNTER — Ambulatory Visit: Payer: 59 | Admitting: Physical Therapy

## 2017-05-02 ENCOUNTER — Encounter: Payer: Self-pay | Admitting: Physical Therapy

## 2017-05-02 DIAGNOSIS — M5441 Lumbago with sciatica, right side: Secondary | ICD-10-CM

## 2017-05-02 NOTE — Therapy (Signed)
[facility] [place], [place], [address] Phone: [phone]   Fax:  [phone]  Physical Therapy Treatment  Patient Details  Name: NAMON VILLARIN MRN: [id number] Date of Birth: Feb 07, 1956 Referring Provider: Clearance Coots MD   Encounter Date: 05/02/2017  PT End of Session - 05/02/17 1306    Visit Number  2    Number of Visits  12    Date for PT Re-Evaluation  06/01/17    PT Start Time  1301    PT Stop Time  1400    PT Time Calculation (min)  59 min    Activity Tolerance  Patient tolerated treatment well    Behavior During Therapy  [facility] for tasks assessed/performed       Past Medical History:  Diagnosis Date  . Allergic rhinitis   . BPH with obstruction/lower urinary tract symptoms   . Coronary artery disease    With preserved EF.  Needs DAPT lifetime due to high risk status.  . Elevated PSA onset @ 2009   Neg bx x 3, most recent 2009 ([facility] in [place]).  PSA stable at 3.98 12/01/14 and 4.61 on 07/25/15 per old records.  PSA 10 at Dr. Noland Fordyce ([facility]) 03/2016: flomax started and plan is to recheck PSA and f/u with urol in 6 mo.  Recheck PSA here 08/2016 was 10.1, then 9.2 on 08/30/16, 10.8 on 04/03/17 at urol--MRI prostate planned.  Marland Kitchen GERD (gastroesophageal reflux disease)   . Hearing loss of both ears   . History of myocardial infarction 2011  . Hyperlipidemia   . Hypertension   . Insomnia   . Organic impotence   . 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  May need MRI L spine and/or emg/NCS  . Squamous cell carcinoma     Past Surgical History:  Procedure Laterality Date  . CARDIAC CATHETERIZATION  01/31/13   Stents to LAD patent; small diagonal stent with 90% ostial stenosis--good overall LV function with mild anterior and focal inferoapical hypokinesis, EF 60-65%.  Unsuccessful angioplasty of diagonal.  . CARDIOVASCULAR STRESS TEST  05/26/15   stress  echo neg for ischemia  . COLONOSCOPY  2008; 06/14/16   2008 Normal.  2018 polyps x 2, path pending. +internal hemorrhoids.  Marland Kitchen HYDROCELE EXCISION / REPAIR  early 2000's  . INGUINAL HERNIA REPAIR Bilateral 1998  . PROSTATE BIOPSY     Multiple; all neg  . PTCA     Cath 2011---4 stents--LAD and diagonal.  2014 repeat cath for CP showed no changes.  . Scrotal u/s  01/2015   numerous extratesticular cystic lesions bilat, c/w numerous epididymal cysts vs loculated hydrocele--following expectantly.    There were no vitals filed for this visit.  Subjective Assessment - 05/02/17 1303    Subjective  Reports that he has had almost zero pain since last treatment. Reports low level pain today with occasional twinges of pain just down RLE.    Pertinent History  H/o low backpain.    Limitations  Standing    How long can you stand comfortably?  15-20 minutes.    Patient Stated Goals  Get out of pain.    Currently in Pain?  Yes    Pain Score  1     Pain Location  Hip    Pain Orientation  Right;Lower    Pain Descriptors / Indicators  Aching    Pain Type  Acute pain    Pain Radiating Towards  R ankle    Pain Onset  1 to 4 weeks ago    Pain Frequency  Occasional    Aggravating Factors   Standing    Pain Relieving Factors  Gabapentin, heat         [facility] PT Assessment - 05/02/17 0001      Assessment   Medical Diagnosis  Scitica of right side.    Next MD Visit  None      Precautions   Precautions  None      Restrictions   Weight Bearing Restrictions  No                  [facility] Adult PT Treatment/Exercise - 05/02/17 0001      Exercises   Exercises  Lumbar      Lumbar Exercises: Stretches   Single Knee to Chest Stretch  2 reps;60 seconds      Lumbar Exercises: Aerobic   Stationary Bike  NuStep L5 x12 min      Lumbar Exercises: Supine   Bridge  20 reps    Straight Leg Raise  10 reps    Isometric Hip Flexion  10 reps;5 seconds    Other Supine Lumbar Exercises  Adductor  squeeze x20 reps with 5 sec hold    Other Supine Lumbar Exercises  R sciatic nerve glide x15 reps      Lumbar Exercises: Sidelying   Clam  15 reps BLE      Modalities   Modalities  Traction      Traction   Type of Traction  Lumbar    Min (lbs)  5    Max (lbs)  75    Hold Time  99    Rest Time  5    Time  15                  PT Long Term Goals - 04/20/17 1926      PT LONG TERM GOAL #1   Title  Independent with an HEP.    Time  6    Status  New      PT LONG TERM GOAL #2   Title  Stand 30 minutes with pain not > 2/10.    Time  6    Period  Weeks    Status  New      PT LONG TERM GOAL #3   Title  Walk a community distance with pain not > 2/10.    Time  6    Period  Weeks    Status  New      PT LONG TERM GOAL #4   Title  Perform ADL's with pain not > 2-3/10.    Time  6    Period  Weeks    Status  New      PT LONG TERM GOAL #5   Title  Eliminate right LE pain.    Time  6    Period  Weeks    Status  New            Plan - 05/02/17 1345    Clinical Impression Statement  Patient presented in clinic with low level R hip pain and pain radiating intermittantly to R ankle. Patient guided through MET exercises to correct leg length discrepency which patient reports as possible family trait. No increased pain reported by patient with exercises. Patient introduced to core activation for core strengthening today as well. Patient educated regarding traction and the purpose with initial  max traction at 75#.    Rehab Potential  Excellent    PT Frequency  2x / week    PT Duration  8 weeks    PT Treatment/Interventions  ADLs/Self Care Home Management;Cryotherapy;Electrical Stimulation;Ultrasound;Moist Heat;Therapeutic activities;Therapeutic exercise;Patient/family education;Manual techniques;Dry needling    PT Next Visit Plan  STW/M to right SIJ; adductor squeeze and resisted right hip flexion ro correct leg length discrepancy; Combo e'stim/U/S; can try int traction  at 40% BW (up to 60-65% per patient tolerance); advanced core exercises.    Consulted and Agree with Plan of Care  Patient       Patient will benefit from skilled therapeutic intervention in order to improve the following deficits and impairments:  Pain, Decreased range of motion  Visit Diagnosis: Acute right-sided low back pain with right-sided sciatica     Problem List Patient Active Problem List   Diagnosis Date Noted  . Sciatica of right side 04/13/2017  . Coronary artery disease involving native coronary artery of native heart without angina pectoris 10/03/2016  . Dyslipidemia 10/03/2016  . Coronary artery disease involving coronary bypass graft of native heart without angina pectoris 04/01/2016  . Essential hypertension 04/01/2016    Standley Brooking, PTA 05/02/2017, 2:36 PM  [facility] [address] [place], [place], [address] Phone: [phone]   Fax:  [phone]  Name: TIERRE NETTO MRN: [id number] Date of Birth: 1955-07-04

## 2017-05-09 ENCOUNTER — Encounter: Payer: Self-pay | Admitting: Physical Therapy

## 2017-05-09 ENCOUNTER — Ambulatory Visit: Payer: 59 | Attending: Family Medicine | Admitting: Physical Therapy

## 2017-05-09 DIAGNOSIS — M5441 Lumbago with sciatica, right side: Secondary | ICD-10-CM | POA: Insufficient documentation

## 2017-05-09 NOTE — Therapy (Signed)
[facility] [place], [place], [address] Phone: [phone]   Fax:  [phone]  Physical Therapy Treatment  Patient Details  Name: Ricky Chavez MRN: [id number] Date of Birth: 1956-03-25 Referring Provider: Clearance Coots MD   Encounter Date: 05/09/2017  PT End of Session - 05/09/17 1256    Visit Number  3    Number of Visits  12    Date for PT Re-Evaluation  06/01/17    PT Start Time  1115    PT Stop Time  1205    PT Time Calculation (min)  50 min    Activity Tolerance  Patient tolerated treatment well    Behavior During Therapy  [facility] for tasks assessed/performed       Past Medical History:  Diagnosis Date  . Allergic rhinitis   . BPH with obstruction/lower urinary tract symptoms   . Coronary artery disease    With preserved EF.  Needs DAPT lifetime due to high risk status.  . Elevated PSA onset @ 2009   Neg bx x 3, most recent 2009 ([facility] in [place]).  PSA stable at 3.98 12/01/14 and 4.61 on 07/25/15 per old records.  PSA 10 at Dr. Noland Fordyce ([facility]) 03/2016: flomax started and plan is to recheck PSA and f/u with urol in 6 mo.  Recheck PSA here 08/2016 was 10.1, then 9.2 on 08/30/16, 10.8 on 04/03/17 at urol--MRI prostate planned.  Marland Kitchen GERD (gastroesophageal reflux disease)   . Hearing loss of both ears   . History of myocardial infarction 2011  . Hyperlipidemia   . Hypertension   . Insomnia   . Organic impotence   . 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  May need MRI L spine and/or emg/NCS  . Squamous cell carcinoma     Past Surgical History:  Procedure Laterality Date  . CARDIAC CATHETERIZATION  01/31/13   Stents to LAD patent; small diagonal stent with 90% ostial stenosis--good overall LV function with mild anterior and focal inferoapical hypokinesis, EF 60-65%.  Unsuccessful angioplasty of diagonal.  . CARDIOVASCULAR STRESS TEST  05/26/15   stress  echo neg for ischemia  . COLONOSCOPY  2008; 06/14/16   2008 Normal.  2018 polyps x 2, path pending. +internal hemorrhoids.  Marland Kitchen HYDROCELE EXCISION / REPAIR  early 2000's  . INGUINAL HERNIA REPAIR Bilateral 1998  . PROSTATE BIOPSY     Multiple; all neg  . PTCA     Cath 2011---4 stents--LAD and diagonal.  2014 repeat cath for CP showed no changes.  . Scrotal u/s  01/2015   numerous extratesticular cystic lesions bilat, c/w numerous epididymal cysts vs loculated hydrocele--following expectantly.    There were no vitals filed for this visit.  Subjective Assessment - 05/09/17 1257    Subjective  That last treatment helped a lot.  The day after I felt great.  I been moving heavy logs at my home.    Pain Score  1     Pain Location  Hip    Pain Orientation  Right;Lower    Pain Descriptors / Indicators  Aching    Pain Type  Acute pain    Pain Onset  1 to 4 weeks ago                      [facility] Adult PT Treatment/Exercise - 05/09/17 0001      Exercises   Exercises  Knee/Hip  Lumbar Exercises: Aerobic   Stationary Bike  Nustep Level 5 x 20 minutes.      Traction   Type of Traction  Lumbar    Min (lbs)  5    Max (lbs)  85    Hold Time  99    Rest Time  5    Time  15      Manual Therapy   Manual Therapy  Soft tissue mobilization    Soft tissue mobilization  In prone:  Localized STW/M x 3 minutes directly over right sacral-iliac and iliolumbar ligament f/b instruct in prone leg lifts and alternate arm'leg lifts.             PT Education - 05/09/17 1307    Education provided  Yes    Education Details  Prone leg lifts and alternate arm/leg lifts.    Person(s) Educated  Patient    Methods  Explanation;Demonstration;Tactile cues    Comprehension  Verbalized understanding          PT Long Term Goals - 04/20/17 1926      PT LONG TERM GOAL #1   Title  Independent with an HEP.    Time  6    Status  New      PT LONG TERM GOAL #2   Title  Stand 30 minutes  with pain not > 2/10.    Time  6    Period  Weeks    Status  New      PT LONG TERM GOAL #3   Title  Walk a community distance with pain not > 2/10.    Time  6    Period  Weeks    Status  New      PT LONG TERM GOAL #4   Title  Perform ADL's with pain not > 2-3/10.    Time  6    Period  Weeks    Status  New      PT LONG TERM GOAL #5   Title  Eliminate right LE pain.    Time  6    Period  Weeks    Status  New            Plan - 05/09/17 1308    Clinical Impression Statement  No pain reported after treatment.  Leg lengths were equal.    PT Treatment/Interventions  ADLs/Self Care Home Management;Cryotherapy;Electrical Stimulation;Ultrasound;Moist Heat;Therapeutic activities;Therapeutic exercise;Patient/family education;Manual techniques;Dry needling    PT Next Visit Plan  STW/M to right SIJ; adductor squeeze and resisted right hip flexion ro correct leg length discrepancy; Combo e'stim/U/S; can try int traction at 40% BW (up to 60-65% per patient tolerance); advanced core exercises.    Consulted and Agree with Plan of Care  Patient       Patient will benefit from skilled therapeutic intervention in order to improve the following deficits and impairments:     Visit Diagnosis: Acute right-sided low back pain with right-sided sciatica     Problem List Patient Active Problem List   Diagnosis Date Noted  . Sciatica of right side 04/13/2017  . Coronary artery disease involving native coronary artery of native heart without angina pectoris 10/03/2016  . Dyslipidemia 10/03/2016  . Coronary artery disease involving coronary bypass graft of native heart without angina pectoris 04/01/2016  . Essential hypertension 04/01/2016    [place], [place] MPT 05/09/2017, 1:10 PM  [facility] [address] [place], [place], [address] Phone: [phone]   Fax:  [phone]  Name: Ricky Chavez MRN: [id number] Date of Birth:  29-Jan-1956

## 2017-05-16 ENCOUNTER — Encounter: Payer: Self-pay | Admitting: Physical Therapy

## 2017-05-16 ENCOUNTER — Ambulatory Visit: Payer: 59 | Admitting: Physical Therapy

## 2017-05-16 DIAGNOSIS — M5441 Lumbago with sciatica, right side: Secondary | ICD-10-CM

## 2017-05-16 NOTE — Therapy (Addendum)
[facility] [place], [place], [address] Phone: [phone]   Fax:  [phone]  Physical Therapy Treatment  Patient Details  Name: Ricky Chavez MRN: [id number] Date of Birth: November 27, 1955 Referring Provider: Clearance Coots MD   Encounter Date: 05/16/2017  PT End of Session - 05/16/17 1449    Visit Number  4    Number of Visits  12    Date for PT Re-Evaluation  06/01/17    PT Start Time  1115       Past Medical History:  Diagnosis Date  . Allergic rhinitis   . BPH with obstruction/lower urinary tract symptoms   . Coronary artery disease    With preserved EF.  Needs DAPT lifetime due to high risk status.  . Elevated PSA onset @ 2009   Neg bx x 3, most recent 2009 ([facility] in [place]).  PSA stable at 3.98 12/01/14 and 4.61 on 07/25/15 per old records.  PSA 10 at Dr. Noland Fordyce ([facility]) 03/2016: flomax started and plan is to recheck PSA and f/u with urol in 6 mo.  Recheck PSA here 08/2016 was 10.1, then 9.2 on 08/30/16, 10.8 on 04/03/17 at urol--MRI prostate planned.  Marland Kitchen GERD (gastroesophageal reflux disease)   . Hearing loss of both ears   . History of myocardial infarction 2011  . Hyperlipidemia   . Hypertension   . Insomnia   . Organic impotence   . 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  May need MRI L spine and/or emg/NCS  . Squamous cell carcinoma     Past Surgical History:  Procedure Laterality Date  . CARDIAC CATHETERIZATION  01/31/13   Stents to LAD patent; small diagonal stent with 90% ostial stenosis--good overall LV function with mild anterior and focal inferoapical hypokinesis, EF 60-65%.  Unsuccessful angioplasty of diagonal.  . CARDIOVASCULAR STRESS TEST  05/26/15   stress echo neg for ischemia  . COLONOSCOPY  2008; 06/14/16   2008 Normal.  2018 polyps x 2, path pending. +internal hemorrhoids.  Marland Kitchen HYDROCELE EXCISION / REPAIR  early 2000's  .  INGUINAL HERNIA REPAIR Bilateral 1998  . PROSTATE BIOPSY     Multiple; all neg  . PTCA     Cath 2011---4 stents--LAD and diagonal.  2014 repeat cath for CP showed no changes.  . Scrotal u/s  01/2015   numerous extratesticular cystic lesions bilat, c/w numerous epididymal cysts vs loculated hydrocele--following expectantly.    There were no vitals filed for this visit.  Subjective Assessment - 05/16/17 1449    Subjective  That traction has helped a lot.  I shoveled my deck and thought I'd be hurting but i wasn't.    Pain Score  1     Pain Orientation  Right;Lower    Pain Descriptors / Indicators  Aching                      [facility] Adult PT Treatment/Exercise - 05/16/17 0001      Exercises   Exercises  Knee/Hip      Lumbar Exercises: Aerobic   Stationary Bike  Level 5 Nustep x 23 minutes.      Traction   Type of Traction  Lumbar    Min (lbs)  5    Max (lbs)  90    Hold Time  99    Rest Time  5    Time  15  PT Long Term Goals - 04/20/17 1926      PT LONG TERM GOAL #1   Title  Independent with an HEP.    Time  6    Status  New      PT LONG TERM GOAL #2   Title  Stand 30 minutes with pain not > 2/10.    Time  6    Period  Weeks    Status  New      PT LONG TERM GOAL #3   Title  Walk a community distance with pain not > 2/10.    Time  6    Period  Weeks    Status  New      PT LONG TERM GOAL #4   Title  Perform ADL's with pain not > 2-3/10.    Time  6    Period  Weeks    Status  New      PT LONG TERM GOAL #5   Title  Eliminate right LE pain.    Time  6    Period  Weeks    Status  New            Plan - 05/16/17 1500    Clinical Impression Statement  No pain after treatment.    Clinical Presentation  Stable    Clinical Decision Making  Low    Rehab Potential  Excellent       Patient will benefit from skilled therapeutic intervention in order to improve the following deficits and impairments:  Pain,  Decreased range of motion  Visit Diagnosis: Acute right-sided low back pain with right-sided sciatica     Problem List Patient Active Problem List   Diagnosis Date Noted  . Sciatica of right side 04/13/2017  . Coronary artery disease involving native coronary artery of native heart without angina pectoris 10/03/2016  . Dyslipidemia 10/03/2016  . Coronary artery disease involving coronary bypass graft of native heart without angina pectoris 04/01/2016  . Essential hypertension 04/01/2016   PHYSICAL THERAPY DISCHARGE SUMMARY  Visits from Start of Care: 4.  Current functional level related to goals / functional outcomes: See above.   Remaining deficits: See below.   Education / Equipment: HEP.  Plan: Patient agrees to discharge.  Patient goals were not met. Patient is being discharged due to the patient's request.  ?????      [place], [place] MPT 05/16/2017, 3:29 PM  [facility] [place], [place], [address] Phone: [phone]   Fax:  [phone]  Name: Ricky Chavez MRN: [id number] Date of Birth: [birth year]

## 2017-05-24 ENCOUNTER — Other Ambulatory Visit: Payer: Self-pay

## 2017-05-24 MED ORDER — CARVEDILOL 6.25 MG PO TABS: 6.2500 mg | ORAL_TABLET | Freq: Two times a day (BID) | ORAL | 1 refills | Status: DC

## 2017-05-24 NOTE — Telephone Encounter (Signed)
Rx(s) sent to pharmacy electronically.  

## 2017-08-30 ENCOUNTER — Other Ambulatory Visit: Payer: Self-pay | Admitting: Cardiology

## 2017-09-12 ENCOUNTER — Other Ambulatory Visit: Payer: Self-pay | Admitting: *Deleted

## 2017-09-12 MED ORDER — CARVEDILOL 6.25 MG PO TABS: 6.2500 mg | ORAL_TABLET | Freq: Two times a day (BID) | ORAL | 0 refills | Status: DC

## 2017-09-19 ENCOUNTER — Other Ambulatory Visit: Payer: Self-pay | Admitting: Cardiology

## 2017-09-19 ENCOUNTER — Other Ambulatory Visit: Payer: Self-pay

## 2017-09-19 MED ORDER — CARVEDILOL 6.25 MG PO TABS: 6.2500 mg | ORAL_TABLET | Freq: Two times a day (BID) | ORAL | 0 refills | Status: DC

## 2017-09-22 ENCOUNTER — Encounter: Payer: Self-pay | Admitting: Cardiology

## 2017-09-22 ENCOUNTER — Other Ambulatory Visit: Payer: Self-pay | Admitting: *Deleted

## 2017-09-24 NOTE — Progress Notes (Signed)
Cardiology Office Note   Date:  09/25/2017   ID:  Ricky Chavez, DOB 05-22-1956, MRN [id number]  PCP:  [name], MD  Cardiologist:   Minus Breeding, MD  Referring:  [name], MD  Chief Complaint  Patient presents with  . Chest Pain      History of Present Illness: Ricky Chavez is a 62 y.o. male who presents for follow up of CAD.  The patient was treated at [facility] for CAD in late 2016.  He had stents to LAD patent; small diagonal stent with 90% ostial stenosis--good overall LV function with mild anterior and focal inferoapical hypokinesis, EF 60-65%. He had unsuccessful angioplasty of the diagonal.  He had a stress echo in 2016 December that was negative for any evidence of ischemia. The patient had a complicated course with his initial angioplasty.  Since I last saw him he he has had some problems with ambulating because he had problems with sciatica.  He had to have PT.  Is not been exercising wants to start walking.  He has been feeling some chest tightness.  This might happen several times per week.  He thinks is similar to symptoms he had prior to his MI.  He feels a chest tightness might last for 30 seconds at a time.  He might have several episodes in a day.  The discomfort is mild.  It comes and goes spontaneously.  He cannot bring it on with activity such as walking.  He denies any radiation to his jaw or to his arms.  His left of his sternum.  There is no associated nausea vomiting or diaphoresis.  He has no new shortness of breath, PND or orthopnea.  Is not describing palpitations, presyncope or syncope.  Past Medical History:  Diagnosis Date  . Allergic rhinitis   . BPH with obstruction/lower urinary tract symptoms   . Coronary artery disease    With preserved EF.  Needs DAPT lifetime due to high risk status.  . Elevated PSA onset @ 2009   Neg bx x 3, most recent 2009 ([facility] in [place]).  PSA stable at 3.98 12/01/14 and 4.61 on 07/25/15  per old records.  PSA 10 at Dr. Noland Fordyce ([facility]) 03/2016: flomax started and plan is to recheck PSA and f/u with urol in 6 mo.  Recheck PSA here 08/2016 was 10.1, then 9.2 on 08/30/16, 10.8 on 04/03/17 at urol--MRI prostate planned.  Marland Kitchen GERD (gastroesophageal reflux disease)   . Hearing loss of both ears   . History of myocardial infarction 2011  . Hyperlipidemia   . Hypertension   . Insomnia   . Organic impotence   . 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  May need MRI L spine and/or emg/NCS  . Squamous cell carcinoma     Past Surgical History:  Procedure Laterality Date  . CARDIAC CATHETERIZATION  01/31/13   Stents to LAD patent; small diagonal stent with 90% ostial stenosis--good overall LV function with mild anterior and focal inferoapical hypokinesis, EF 60-65%.  Unsuccessful angioplasty of diagonal.  . CARDIOVASCULAR STRESS TEST  05/26/15   stress echo neg for ischemia  . COLONOSCOPY  2008; 06/14/16   2008 Normal.  2018 polyps x 2, path pending. +internal hemorrhoids.  Marland Kitchen HYDROCELE EXCISION / REPAIR  early 2000's  . INGUINAL HERNIA REPAIR Bilateral 1998  . PROSTATE BIOPSY     Multiple; all neg  . PTCA  Cath 2011---4 stents--LAD and diagonal.  2014 repeat cath for CP showed no changes.  . Scrotal u/s  01/2015   numerous extratesticular cystic lesions bilat, c/w numerous epididymal cysts vs loculated hydrocele--following expectantly.     Current Outpatient Medications  Medication Sig Dispense Refill  . aspirin EC 81 MG tablet Take 1 tablet by mouth daily.    Marland Kitchen atorvastatin (LIPITOR) 40 MG tablet Take 1 tablet (40 mg total) by mouth daily. 90 tablet 3  . BRILINTA 60 MG TABS tablet TAKE 1 TABLET TWICE A DAY 180 tablet 3  . carvedilol (COREG) 6.25 MG tablet Take 1 tablet (6.25 mg total) by mouth 2 (two) times daily. 180 tablet 0  . lisinopril (PRINIVIL,ZESTRIL) 5 MG tablet TAKE 1 TABLET DAILY 90 tablet 1  . Melatonin (MELATONIN MAXIMUM  STRENGTH) 5 MG TABS Take 1 tablet by mouth at bedtime.    . Multiple Vitamins-Minerals (MULTIVITAMIN ADULT PO) Take 1 capsule by mouth daily.    . nitroGLYCERIN (NITROLINGUAL) 0.4 MG/SPRAY spray Place 1 spray under the tongue every 5 (five) minutes x 3 doses as needed for chest pain. 12 g 1  . omeprazole (PRILOSEC) 20 MG capsule Take 1 capsule by mouth daily.    . tamsulosin (FLOMAX) 0.4 MG CAPS capsule Take 0.4 mg by mouth daily.    . vardenafil (LEVITRA) 20 MG tablet Take 1 tablet by mouth daily as needed.     Current Facility-Administered Medications  Medication Dose Route Frequency Provider Last Rate Last Dose  . 0.9 %  sodium chloride infusion  500 mL Intravenous Continuous Milus Banister, MD        Allergies:   Patient has no known allergies.    ROS:  Please see the history of present illness.   Otherwise, review of systems are positive for none.   All other systems are reviewed and negative.    PHYSICAL EXAM: VS:  BP 112/71   Pulse (!) 52   Ht 5\' 8"  (1.727 m)   Wt 184 lb 12.8 oz (83.8 kg)   BMI 28.10 kg/m  , BMI Body mass index is 28.1 kg/m.  GENERAL:  Well appearing NECK:  No jugular venous distention, waveform within normal limits, carotid upstroke brisk and symmetric, no bruits, no thyromegaly LUNGS:  Clear to auscultation bilaterally CHEST:  Unremarkable HEART:  PMI not displaced or sustained,S1 and S2 within normal limits, no S3, no S4, no clicks, no rubs, no murmurs ABD:  Flat, positive bowel sounds normal in frequency in pitch, no bruits, no rebound, no guarding, no midline pulsatile mass, no hepatomegaly, no splenomegaly EXT:  2 plus pulses throughout, no edema, no cyanosis no clubbing   EKG:  EKG is ordered today. Sinus bradycardia, rate 52, axis within normal limits, intervals within normal limits, nonspecific inferior T wave flattening.   Recent Labs: No results found for requested labs within last 8760 hours.    Lipid Panel    Component Value  Date/Time   CHOL 94 09/01/2016 0925   TRIG 120 09/01/2016 0925   HDL 27 (L) 09/01/2016 0925   CHOLHDL 3.5 09/01/2016 0925   VLDL 24 09/01/2016 0925   LDLCALC 43 09/01/2016 0925      Wt Readings from Last 3 Encounters:  09/25/17 184 lb 12.8 oz (83.8 kg)  04/13/17 185 lb (83.9 kg)  04/05/17 186 lb 8 oz (84.6 kg)      Other studies Reviewed: Additional studies/ records that were reviewed today include: None Review of the above records  demonstrates:      ASSESSMENT AND PLAN:    CAD:   The patient does have some symptoms that are somewhat atypical but given his past history the pretest probability of obstructive coronary disease as the etiology is at least moderate.  He would have trouble walking on a treadmill because of his recent sciatica.  I will bring him back for stress test and he will need to have a Lexiscan Myoview.  Of note his previous intervention note suggested that he have lifelong DAPT.   DYSLIPIDEMIA:    His LDL was excellent last year.  HDL was low.  He is going to have repeat lipids.   HTN:  The blood pressure is at target. No change in medications is indicated. We will continue with therapeutic lifestyle changes (TLC).  Current medicines are reviewed at length with the patient today.  The patient does not have concerns regarding medicines.  The following changes have been made:  None  Labs/ tests ordered today include:   Orders Placed This Encounter  Procedures  . MYOCARDIAL PERFUSION IMAGING  . EKG 12-Lead     Disposition:   FU with me in 12 months or sooner based on symptoms. [facility].     Signed, Minus Breeding, MD  09/25/2017 2:10 PM    [facility]

## 2017-09-25 ENCOUNTER — Ambulatory Visit: Payer: 59 | Admitting: Cardiology

## 2017-09-25 ENCOUNTER — Encounter: Payer: Self-pay | Admitting: Cardiology

## 2017-09-25 VITALS — BP 112/71 | HR 52 | Ht 68.0 in | Wt 184.8 lb

## 2017-09-25 DIAGNOSIS — I10 Essential (primary) hypertension: Secondary | ICD-10-CM

## 2017-09-25 DIAGNOSIS — E785 Hyperlipidemia, unspecified: Secondary | ICD-10-CM

## 2017-09-25 DIAGNOSIS — R0789 Other chest pain: Secondary | ICD-10-CM | POA: Diagnosis not present

## 2017-09-25 DIAGNOSIS — I25119 Atherosclerotic heart disease of native coronary artery with unspecified angina pectoris: Secondary | ICD-10-CM

## 2017-09-25 NOTE — Patient Instructions (Signed)
Medication Instructions:  Continue current medications  If you need a refill on your cardiac medications before your next appointment, please call your pharmacy.  Labwork: None Ordered  Testing/Procedures: Your physician has requested that you have a lexiscan myoview. For further information please visit [email or web address]. Please follow instruction sheet, as given.   Follow-Up: Your physician wants you to follow-up in: 1 Year You should receive a reminder letter in the mail two months in advance. If you do not receive a letter, please call our office [phone]     Thank you for choosing [facility]!!      

## 2017-09-27 ENCOUNTER — Telehealth: Payer: Self-pay | Admitting: Cardiology

## 2017-09-27 MED ORDER — CARVEDILOL 6.25 MG PO TABS: 6.2500 mg | ORAL_TABLET | Freq: Two times a day (BID) | ORAL | 3 refills | Status: DC

## 2017-09-27 NOTE — Telephone Encounter (Signed)
On a three way call with patient and jeanie ( [facility]sk)  To assist patient in getting  carvediolol 6.25 mg twice a day.  Representative with [facility] unable assist. rn stated to both will call  [facility] and then contact patient.

## 2017-09-27 NOTE — Telephone Encounter (Signed)
Spoke to representative at [facility] for carvedilol was received on 09/19/17 and mailed on 09/20/17 and delivered on 09/25/17 .  information was relayed to patient and [phone] number given to patient to follow up with [facility].  PATIENT VERBALIZED UNDERSTANDING.

## 2017-09-27 NOTE — Telephone Encounter (Signed)
New Message ° ° ° °*STAT* If patient is at the pharmacy, call can be transferred to refill team. ° ° °1. Which medications need to be refilled? (please list name of each medication and dose if known) carvedilol (COREG) 6.25 MG tablet ° ° °2. Which pharmacy/location (including street and city if local pharmacy) is medication to be sent to? [facility] - [place], [place] - [address] AT Portal to Registered [facility] Sites ° °3. Do they need a 30 day or 90 day supply? 90 ° ° °

## 2017-09-29 ENCOUNTER — Telehealth (HOSPITAL_COMMUNITY): Payer: Self-pay

## 2017-09-29 NOTE — Telephone Encounter (Signed)
Encounter complete. 

## 2017-10-04 ENCOUNTER — Ambulatory Visit (HOSPITAL_COMMUNITY)
Admission: RE | Admit: 2017-10-04 | Discharge: 2017-10-04 | Disposition: A | Discharge status: Home / Self Care | Payer: 59 | Source: Ambulatory Visit | Attending: Internal Medicine | Admitting: Internal Medicine

## 2017-10-04 DIAGNOSIS — I10 Essential (primary) hypertension: Secondary | ICD-10-CM | POA: Insufficient documentation

## 2017-10-04 DIAGNOSIS — I2510 Atherosclerotic heart disease of native coronary artery without angina pectoris: Secondary | ICD-10-CM | POA: Insufficient documentation

## 2017-10-04 DIAGNOSIS — I252 Old myocardial infarction: Secondary | ICD-10-CM | POA: Diagnosis not present

## 2017-10-04 DIAGNOSIS — R0789 Other chest pain: Secondary | ICD-10-CM | POA: Diagnosis not present

## 2017-10-04 DIAGNOSIS — Z8249 Family history of ischemic heart disease and other diseases of the circulatory system: Secondary | ICD-10-CM | POA: Insufficient documentation

## 2017-10-04 HISTORY — PX: CARDIOVASCULAR STRESS TEST: SHX262

## 2017-10-04 LAB — MYOCARDIAL PERFUSION IMAGING
LV dias vol: 107 mL (ref 62–150)
LV sys vol: 44 mL
Peak HR: 85 {beats}/min
Rest HR: 50 {beats}/min
SDS: 2
SRS: 3
SSS: 5
TID: 1.1

## 2017-10-04 MED ORDER — REGADENOSON 0.4 MG/5ML IV SOLN
0.4000 mg | Freq: Once | INTRAVENOUS | Status: AC
  Administered 2017-10-04: 0.4 mg via INTRAVENOUS

## 2017-10-04 MED ORDER — TECHNETIUM TC 99M TETROFOSMIN IV KIT
10.5000 | PACK | Freq: Once | INTRAVENOUS | Status: AC | PRN
  Administered 2017-10-04: 10.5 via INTRAVENOUS
  Filled 2017-10-04: qty 11

## 2017-10-04 MED ORDER — TECHNETIUM TC 99M TETROFOSMIN IV KIT
31.1000 | PACK | Freq: Once | INTRAVENOUS | Status: AC | PRN
  Administered 2017-10-04: 31.1 via INTRAVENOUS
  Filled 2017-10-04: qty 32

## 2017-10-05 ENCOUNTER — Ambulatory Visit: Payer: 59 | Admitting: Family Medicine

## 2017-10-05 ENCOUNTER — Encounter: Payer: Self-pay | Admitting: Family Medicine

## 2017-10-05 ENCOUNTER — Encounter: Payer: 59 | Admitting: Family Medicine

## 2017-10-05 VITALS — BP 104/60 | HR 62 | Temp 98.5°F | Resp 16 | Ht 68.0 in | Wt 182.5 lb

## 2017-10-05 DIAGNOSIS — Z0000 Encounter for general adult medical examination without abnormal findings: Secondary | ICD-10-CM | POA: Diagnosis not present

## 2017-10-05 NOTE — Progress Notes (Signed)
Office Note 10/05/2017  CC:  Chief Complaint  Patient presents with  . Annual Exam    Pt is not fasting.     HPI:  Ricky Chavez is a 62 y.o. White male who is here for annual health maintenance exam.  Exercising: only sporadically.  His hip/leg problem caused him 6 mo of problems that inhibited exercise. PT determined that this was SI jt dysfunction and they have FIXED HIM! Starting to garden and get on treadmill again.  Diet: lots of fruits and veggies; needs to work on limiting fast foods. Dental preventatives: UTD. Eyes: needs to schedule.  Past Medical History:  Diagnosis Date  . Allergic rhinitis   . BPH with obstruction/lower urinary tract symptoms   . Coronary artery disease    With preserved EF.  Needs DAPT lifetime due to high risk status.  . Elevated PSA onset @ 2009   Neg bx x 3, most recent 2009 ([facility] in [place]).  PSA stable at 3.98 12/01/14 and 4.61 on 07/25/15 per old records.  PSA 10 at Dr. Noland Fordyce ([facility]) 03/2016: flomax started and plan is to recheck PSA and f/u with urol in 6 mo.  Recheck PSA here 08/2016 was 10.1, then 9.2 on 08/30/16, 10.8 on 04/03/17 at urol--MRI prostate planned.  Marland Kitchen GERD (gastroesophageal reflux disease)   . Hearing loss of both ears   . History of myocardial infarction 2011  . Hyperlipidemia   . Hypertension   . Insomnia   . Organic impotence   . 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  May need MRI L spine and/or emg/NCS  . Squamous cell carcinoma     Past Surgical History:  Procedure Laterality Date  . CARDIAC CATHETERIZATION  01/31/13   Stents to LAD patent; small diagonal stent with 90% ostial stenosis--good overall LV function with mild anterior and focal inferoapical hypokinesis, EF 60-65%.  Unsuccessful angioplasty of diagonal.  . CARDIOVASCULAR STRESS TEST  05/26/15   stress echo neg for ischemia  . CARDIOVASCULAR STRESS TEST  10/04/2017   Myoc perf  imaging: normal (EF 59%)  . COLONOSCOPY  2008; 06/14/16   2008 Normal.  2018 hyperplastic polyp +internal hemorrhoids.  Recall 2028.  Marland Kitchen HYDROCELE EXCISION / REPAIR  early 2000's  . INGUINAL HERNIA REPAIR Bilateral 1998  . PROSTATE BIOPSY     Multiple; all neg.  Prostate MRI 2018 -NO SIGN OF PROSTATE CA.  Marland Kitchen PTCA     Cath 2011---4 stents--LAD and diagonal.  2014 repeat cath for CP showed no changes.  . Scrotal u/s  01/2015   numerous extratesticular cystic lesions bilat, c/w numerous epididymal cysts vs loculated hydrocele--following expectantly.    Family History  Problem Relation Age of Onset  . Hyperlipidemia Mother   . Hypertension Mother   . Heart disease Mother 41       CABG  . Heart disease Father 43       CABG  . Hyperlipidemia Father   . Hypertension Father   . Colon polyps Father   . Hypothyroidism Sister   . Heart attack Brother 28       MI  . Prostate cancer Paternal Uncle   . Peripheral vascular disease Brother        Carotid   . Stroke Neg Hx   . Diabetes Neg Hx     Social History   Socioeconomic History  . Marital status: Married    Spouse name: Not on file  . Number  of children: 2  . Years of education: Not on file  . Highest education level: Not on file  Occupational History  . Occupation: retired  Scientific laboratory technician  . Financial resource strain: Not on file  . Food insecurity:    Worry: Not on file    Inability: Not on file  . Transportation needs:    Medical: Not on file    Non-medical: Not on file  Tobacco Use  . Smoking status: Former Smoker    Packs/day: 0.25    Years: 2.00    Pack years: 0.50    Types: Cigarettes    Last attempt to quit: 06/06/1978    Years since quitting: 39.3  . Smokeless tobacco: Never Used  Substance and Sexual Activity  . Alcohol use: Yes    Alcohol/week: 8.4 oz    Types: 14 Glasses of wine per week  . Drug use: No  . Sexual activity: Not on file  Lifestyle  . Physical activity:    Days per week: Not on file     Minutes per session: Not on file  . Stress: Not on file  Relationships  . Social connections:    Talks on phone: Not on file    Gets together: Not on file    Attends religious service: Not on file    Active member of club or organization: Not on file    Attends meetings of clubs or organizations: Not on file    Relationship status: Not on file  . Intimate partner violence:    Fear of current or ex partner: Not on file    Emotionally abused: Not on file    Physically abused: Not on file    Forced sexual activity: Not on file  Other Topics Concern  . Not on file  Social History Narrative   Married, 2 grown children.   Relocated to [place] from [facility] 2016.   Educ: Masters degree   Occup: Retired Government social research officer with [employer]   No tob.   Alc: 1 glass wine/night.    Outpatient Medications Prior to Visit  Medication Sig Dispense Refill  . aspirin EC 81 MG tablet Take 1 tablet by mouth daily.    Marland Kitchen atorvastatin (LIPITOR) 40 MG tablet Take 1 tablet (40 mg total) by mouth daily. 90 tablet 3  . BRILINTA 60 MG TABS tablet TAKE 1 TABLET TWICE A DAY 180 tablet 3  . carvedilol (COREG) 6.25 MG tablet Take 1 tablet (6.25 mg total) by mouth 2 (two) times daily. 180 tablet 3  . lisinopril (PRINIVIL,ZESTRIL) 5 MG tablet TAKE 1 TABLET DAILY 90 tablet 1  . Melatonin (MELATONIN MAXIMUM STRENGTH) 5 MG TABS Take 1 tablet by mouth at bedtime.    . Multiple Vitamins-Minerals (MULTIVITAMIN ADULT PO) Take 1 capsule by mouth daily.    . nitroGLYCERIN (NITROLINGUAL) 0.4 MG/SPRAY spray Place 1 spray under the tongue every 5 (five) minutes x 3 doses as needed for chest pain. 12 g 1  . omeprazole (PRILOSEC) 20 MG capsule Take 1 capsule by mouth daily.    . tamsulosin (FLOMAX) 0.4 MG CAPS capsule Take 0.4 mg by mouth daily.    . vardenafil (LEVITRA) 20 MG tablet Take 1 tablet by mouth daily as needed.    Marland Kitchen 0.9 %  sodium chloride infusion      No facility-administered medications prior to visit.     No  Known Allergies  ROS Review of Systems  Constitutional: Negative for appetite change, chills, fatigue and fever.  HENT: Negative for congestion, dental problem, ear pain and sore throat.   Eyes: Negative for discharge, redness and visual disturbance.  Respiratory: Negative for cough, chest tightness, shortness of breath and wheezing.   Cardiovascular: Negative for chest pain, palpitations and leg swelling.  Gastrointestinal: Negative for abdominal pain, blood in stool, diarrhea, nausea and vomiting.  Genitourinary: Negative for difficulty urinating, dysuria, flank pain, frequency, hematuria and urgency.  Musculoskeletal: Negative for arthralgias, back pain, joint swelling, myalgias and neck stiffness.  Skin: Negative for pallor and rash.  Neurological: Negative for dizziness, speech difficulty, weakness and headaches.  Hematological: Negative for adenopathy. Does not bruise/bleed easily.  Psychiatric/Behavioral: Negative for confusion and sleep disturbance. The patient is not nervous/anxious.     PE; Blood pressure 104/60, pulse 62, temperature 98.5 F (36.9 C), temperature source Oral, resp. rate 16, height 5\' 8"  (1.727 m), weight 182 lb 8 oz (82.8 kg), SpO2 97 %. Gen: Alert, well appearing.  Patient is oriented to person, place, time, and situation. AFFECT: pleasant, lucid thought and speech. ENT: Ears: EACs clear, normal epithelium.  TMs with good light reflex and landmarks bilaterally.  Eyes: no injection, icteris, swelling, or exudate.  EOMI, PERRLA. Nose: no drainage or turbinate edema/swelling.  No injection or focal lesion.  Mouth: lips without lesion/swelling.  Oral mucosa pink and moist.  Dentition intact and without obvious caries or gingival swelling.  Oropharynx without erythema, exudate, or swelling.  Neck: supple/nontender.  No LAD, mass, or TM.  Carotid pulses 2+ bilaterally, without bruits. CV: RRR, no m/r/g.   LUNGS: CTA bilat, nonlabored resps, good aeration in all lung  fields. ABD: soft, NT, ND, BS normal.  No hepatospenomegaly or mass.  No bruits. EXT: no clubbing, cyanosis, or edema.  Musculoskeletal: no joint swelling, erythema, warmth, or tenderness.  ROM of all joints intact. Skin - no sores or suspicious lesions or rashes or color changes Rectal: deferred (urologist following pt)  Pertinent labs:  Lab Results  Component Value Date   TSH 1.74 09/01/2016   Lab Results  Component Value Date   WBC 7.5 09/01/2016   HGB 14.8 09/01/2016   HCT 43.8 09/01/2016   MCV 90.7 09/01/2016   PLT 195 09/01/2016   Lab Results  Component Value Date   CREATININE 1.03 09/01/2016   BUN 12 09/01/2016   NA 140 09/01/2016   K 4.4 09/01/2016   CL 106 09/01/2016   CO2 23 09/01/2016   Lab Results  Component Value Date   ALT 42 09/01/2016   AST 35 09/01/2016   ALKPHOS 72 09/01/2016   BILITOT 1.0 09/01/2016   Lab Results  Component Value Date   CHOL 94 09/01/2016   Lab Results  Component Value Date   HDL 27 (L) 09/01/2016   Lab Results  Component Value Date   LDLCALC 43 09/01/2016   Lab Results  Component Value Date   TRIG 120 09/01/2016   Lab Results  Component Value Date   CHOLHDL 3.5 09/01/2016   Lab Results  Component Value Date   PSA 9.2 09/26/2016   PSA 10.1 (H) 09/01/2016    ASSESSMENT AND PLAN:   Health maintenance exam: Reviewed age and gender appropriate health maintenance issues (prudent diet, regular exercise, health risks of tobacco and excessive alcohol, use of seatbelts, fire alarms in home, use of sunscreen).  Also reviewed age and gender appropriate health screening as well as vaccine recommendations. Vaccines: UTD.  Shingrix discussed--he wants to look into this further and if he wants to get  it he'll make nurse visit for this. Labs: fasting HP--future ASAP b/c pt not fasting today. Prostate ca screening: followed by Dr. Alyson Ingles in Urology for hx of elevated PSA. Colon ca screening: recall 2028.  An After Visit  Summary was printed and given to the patient.  FOLLOW UP:  Return in about 1 year (around 10/06/2018) for annual CPE (fasting): needs to make fasting lab appt ASAP as well..  Signed:  Crissie Sickles, MD           10/05/2017

## 2017-10-05 NOTE — Patient Instructions (Signed)

## 2017-10-06 ENCOUNTER — Other Ambulatory Visit (INDEPENDENT_AMBULATORY_CARE_PROVIDER_SITE_OTHER): Payer: 59

## 2017-10-06 DIAGNOSIS — Z0000 Encounter for general adult medical examination without abnormal findings: Secondary | ICD-10-CM

## 2017-10-09 ENCOUNTER — Encounter: Payer: Self-pay | Admitting: Family Medicine

## 2017-10-10 ENCOUNTER — Encounter: Payer: 59 | Admitting: Family Medicine

## 2017-10-10 LAB — TSH: TSH: 2.07 mIU/L (ref 0.40–4.50)

## 2017-10-10 LAB — COMPREHENSIVE METABOLIC PANEL
AG Ratio: 1.8 (calc) (ref 1.0–2.5)
ALT: 31 U/L (ref 9–46)
AST: 30 U/L (ref 10–35)
Albumin: 4.2 g/dL (ref 3.6–5.1)
Alkaline phosphatase (APISO): 69 U/L (ref 40–115)
BUN: 13 mg/dL (ref 7–25)
CO2: 27 mmol/L (ref 20–32)
CREATININE: 1.09 mg/dL (ref 0.70–1.25)
Calcium: 9.6 mg/dL (ref 8.6–10.3)
Chloride: 105 mmol/L (ref 98–110)
GLOBULIN: 2.4 g/dL (ref 1.9–3.7)
GLUCOSE: 105 mg/dL — AB (ref 65–99)
Potassium: 4.6 mmol/L (ref 3.5–5.3)
Sodium: 141 mmol/L (ref 135–146)
Total Bilirubin: 1.1 mg/dL (ref 0.2–1.2)
Total Protein: 6.6 g/dL (ref 6.1–8.1)

## 2017-10-10 LAB — CBC WITH DIFFERENTIAL/PLATELET
BASOS ABS: 50 {cells}/uL (ref 0–200)
BASOS PCT: 0.8 %
EOS ABS: 279 {cells}/uL (ref 15–500)
Eosinophils Relative: 4.5 %
HCT: 44.2 % (ref 38.5–50.0)
HEMOGLOBIN: 15.7 g/dL (ref 13.2–17.1)
LYMPHS ABS: 1872 {cells}/uL (ref 850–3900)
MCH: 31.5 pg (ref 27.0–33.0)
MCHC: 35.5 g/dL (ref 32.0–36.0)
MCV: 88.6 fL (ref 80.0–100.0)
MPV: 10.3 fL (ref 7.5–12.5)
Monocytes Relative: 8.9 %
NEUTROS ABS: 3447 {cells}/uL (ref 1500–7800)
Neutrophils Relative %: 55.6 %
Platelets: 185 10*3/uL (ref 140–400)
RBC: 4.99 10*6/uL (ref 4.20–5.80)
RDW: 12.6 % (ref 11.0–15.0)
Total Lymphocyte: 30.2 %
WBC mixed population: 552 cells/uL (ref 200–950)
WBC: 6.2 10*3/uL (ref 3.8–10.8)

## 2017-10-10 LAB — TEST AUTHORIZATION

## 2017-10-10 LAB — LIPID PANEL
Cholesterol: 119 mg/dL (ref <200)
HDL: 42 mg/dL (ref >40)
LDL CHOLESTEROL (CALC): 57 mg/dL
NON-HDL CHOLESTEROL (CALC): 77 mg/dL (ref <130)
TRIGLYCERIDES: 114 mg/dL (ref <150)
Total CHOL/HDL Ratio: 2.8 (calc) (ref <5.0)

## 2017-10-10 LAB — HEMOGLOBIN A1C W/OUT EAG: Hgb A1c MFr Bld: 5.6 % of total Hgb (ref <5.7)

## 2017-11-02 ENCOUNTER — Encounter: Payer: Self-pay | Admitting: Family Medicine

## 2017-11-03 MED ORDER — ATORVASTATIN CALCIUM 40 MG PO TABS
40.0000 mg | ORAL_TABLET | Freq: Every day | ORAL | 1 refills | Status: DC
Start: 2017-11-03 — End: 2018-04-07

## 2017-11-07 ENCOUNTER — Other Ambulatory Visit: Payer: Self-pay

## 2017-11-07 MED ORDER — CARVEDILOL 6.25 MG PO TABS: 6.2500 mg | ORAL_TABLET | Freq: Two times a day (BID) | ORAL | 3 refills | Status: DC

## 2017-11-15 ENCOUNTER — Encounter: Payer: Self-pay | Admitting: Family Medicine

## 2017-11-21 ENCOUNTER — Ambulatory Visit: Payer: 59 | Admitting: Family Medicine

## 2017-11-21 ENCOUNTER — Encounter: Payer: Self-pay | Admitting: Family Medicine

## 2017-11-21 VITALS — BP 88/58 | HR 60 | Temp 98.3°F | Resp 16 | Ht 68.0 in | Wt 185.0 lb

## 2017-11-21 DIAGNOSIS — M5431 Sciatica, right side: Secondary | ICD-10-CM

## 2017-11-21 DIAGNOSIS — G8929 Other chronic pain: Secondary | ICD-10-CM | POA: Diagnosis not present

## 2017-11-21 DIAGNOSIS — M533 Sacrococcygeal disorders, not elsewhere classified: Secondary | ICD-10-CM

## 2017-11-21 NOTE — Patient Instructions (Signed)
Take 3 of the over the counter strength ibuprofen with food every 12 hours for 7 days.

## 2017-11-21 NOTE — Progress Notes (Signed)
OFFICE VISIT  11/21/2017   CC:  Chief Complaint  Patient presents with  . Sciatica    right side   HPI:    Patient is a 62 y.o. Caucasian male who presents for right hip/buttocks pain. Hx of R sided sciatica 2018, improved/resolved with PT at [facility] rehab in [place], [place].  PT impression was SI joint dysfunction. Gabapentin in the past (prior to his PT)--no help at 200 bid and no side effects.  No opioids in the past for this.  Onset about 1 week ago w/out trigger.  Starts in R buttock region and radiates to level of knee intermittently. Occ numbness in R leg laterally--radiating.  No weakness.  Gets worse with leaning to the R. Leaning to the left relieves it.  Sleeping on L side is painful, sleeping on R alleviates it. Has taken tylenol/advil--no help. Seems to be worsening each day.  Has been doing some stretches to try to relieve sx's, helps a few minutes only.  Past Medical History:  Diagnosis Date  . Allergic rhinitis   . BPH with obstruction/lower urinary tract symptoms   . Coronary artery disease    With preserved EF.  Needs DAPT lifetime due to high risk status.  . Elevated PSA onset @ 2009   Neg bx x 3, most recent 2009 ([facility] in [place]).  PSA stable at 3.98 12/01/14 and 4.61 on 07/25/15 per old records.  PSA 10 at Dr. Noland Fordyce ([facility]) 03/2016: flomax started and plan is to recheck PSA and f/u with urol in 6 mo.  Recheck PSA here 08/2016 was 10.1, then 9.2 on 08/30/16, 10.8 on 04/03/17 at urol f/u: MR prostate 04/2017->BPH.  Marland Kitchen GERD (gastroesophageal reflux disease)   . Hearing loss of both ears   . History of myocardial infarction 2011  . Hyperlipidemia   . Hypertension   . Insomnia   . Organic impotence   . 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  May need MRI L spine and/or emg/NCS  . Squamous cell carcinoma     Past Surgical History:  Procedure Laterality Date  . CARDIAC CATHETERIZATION  01/31/13   Stents to LAD patent; small diagonal stent with 90% ostial stenosis--good overall LV function with mild anterior and focal inferoapical hypokinesis, EF 60-65%.  Unsuccessful angioplasty of diagonal.  . CARDIOVASCULAR STRESS TEST  05/26/15   stress echo neg for ischemia  . CARDIOVASCULAR STRESS TEST  10/04/2017   Myoc perf imaging: normal (EF 59%)  . COLONOSCOPY  2008; 06/14/16   2008 Normal.  2018 hyperplastic polyp +internal hemorrhoids.  Recall 2028.  Marland Kitchen HYDROCELE EXCISION / REPAIR  early 2000's  . INGUINAL HERNIA REPAIR Bilateral 1998  . PROSTATE BIOPSY     Multiple; all neg.  Prostate MRI 2018 -NO SIGN OF PROSTATE CA.  Marland Kitchen PTCA     Cath 2011---4 stents--LAD and diagonal.  2014 repeat cath for CP showed no changes.  . Scrotal u/s  01/2015   numerous extratesticular cystic lesions bilat, c/w numerous epididymal cysts vs loculated hydrocele--following expectantly.    Outpatient Medications Prior to Visit  Medication Sig Dispense Refill  . aspirin EC 81 MG tablet Take 1 tablet by mouth daily.    Marland Kitchen atorvastatin (LIPITOR) 40 MG tablet Take 1 tablet (40 mg total) by mouth daily. 90 tablet 1  . BRILINTA 60 MG TABS tablet TAKE 1 TABLET TWICE A DAY 180 tablet 3  . carvedilol (COREG) 6.25 MG tablet Take 1 tablet (6.25  mg total) by mouth 2 (two) times daily. 180 tablet 3  . lisinopril (PRINIVIL,ZESTRIL) 5 MG tablet TAKE 1 TABLET DAILY 90 tablet 1  . Melatonin (MELATONIN MAXIMUM STRENGTH) 5 MG TABS Take 1 tablet by mouth at bedtime.    . Multiple Vitamins-Minerals (MULTIVITAMIN ADULT PO) Take 1 capsule by mouth daily.    . nitroGLYCERIN (NITROLINGUAL) 0.4 MG/SPRAY spray Place 1 spray under the tongue every 5 (five) minutes x 3 doses as needed for chest pain. 12 g 1  . omeprazole (PRILOSEC) 20 MG capsule Take 1 capsule by mouth daily.    . tamsulosin (FLOMAX) 0.4 MG CAPS capsule Take 0.4 mg by mouth daily.    . finasteride (PROSCAR) 5 MG tablet Take 1 tablet by mouth daily.    . vardenafil (LEVITRA) 20  MG tablet Take 1 tablet by mouth daily as needed.     No facility-administered medications prior to visit.     No Known Allergies  ROS As per HPI  PE: Blood pressure (!) 88/58, pulse 60, temperature 98.3 F (36.8 C), temperature source Oral, resp. rate 16, height 5\' 8"  (1.727 m), weight 185 lb (83.9 kg), SpO2 96 %. Gen: Alert, well appearing.  Patient is oriented to person, place, time, and situation. AFFECT: pleasant, lucid thought and speech. BACK: no tenderness to back or ischial tuberosity on either side. ROM of back fully intact, with mild pain with rotation of L spine to L and with lateral bending of L spine to the R. Sitting and supine SLR neg bilat. LE strength 5/5 prox/dist bilat. Patellar DTRs trace bilat. Cannot elicit achilles DTR on either side. With FABER on R he feels pain in R glut/SI joint region when the SI joint is opened up and closed back down.  LABS:  none  IMPRESSION AND PLAN:  Recurrent right SI joint dysfunction (+ a slight component of R sciatica?). No help from prednisone or gabapentin in the past. PT for SI joint dysfunction/pain was helpful last year. He is in favor of referral back to PT today so I ordered this (TENS and traction were very helpful). Ibup 600 bid with food x 7d recommended.  An After Visit Summary was printed and given to the patient.  FOLLOW UP: Return if symptoms worsen or fail to improve.  Signed:  Crissie Sickles, MD           11/21/2017

## 2017-11-27 ENCOUNTER — Encounter: Payer: Self-pay | Admitting: Family Medicine

## 2017-11-27 ENCOUNTER — Ambulatory Visit: Payer: 59 | Attending: Family Medicine | Admitting: Physical Therapy

## 2017-11-27 DIAGNOSIS — G8929 Other chronic pain: Secondary | ICD-10-CM | POA: Diagnosis present

## 2017-11-27 DIAGNOSIS — M5441 Lumbago with sciatica, right side: Secondary | ICD-10-CM | POA: Insufficient documentation

## 2017-11-27 DIAGNOSIS — M545 Low back pain, unspecified: Secondary | ICD-10-CM

## 2017-11-27 NOTE — Therapy (Signed)
[facility] [place], [place], [address] Phone: [phone]   Fax:  [phone]  Physical Therapy Evaluation  Patient Details  Name: Ricky Chavez MRN: [id number] Date of Birth: 1955-10-07 Referring Provider: Shawnie Dapper MD   Encounter Date: 11/27/2017  PT End of Session - 11/27/17 1325    Visit Number  1    Number of Visits  12    Date for PT Re-Evaluation  01/08/18    PT Start Time  5427    PT Stop Time  1127    PT Time Calculation (min)  50 min       Past Medical History:  Diagnosis Date  . Allergic rhinitis   . BPH with obstruction/lower urinary tract symptoms   . Coronary artery disease    With preserved EF.  Needs DAPT lifetime due to high risk status.  . Elevated PSA onset @ 2009   Neg bx x 3, most recent 2009 ([facility] in [place]).  PSA stable at 3.98 12/01/14 and 4.61 on 07/25/15 per old records.  PSA 10 at Dr. Noland Fordyce ([facility]) 03/2016: flomax started and plan is to recheck PSA and f/u with urol in 6 mo.  Recheck PSA here 08/2016 was 10.1, then 9.2 on 08/30/16, 10.8 on 04/03/17 at urol f/u: MR prostate 04/2017->BPH.  Marland Kitchen GERD (gastroesophageal reflux disease)   . Hearing loss of both ears   . History of myocardial infarction 2011  . Hyperlipidemia   . Hypertension   . Insomnia   . Organic impotence   . 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  May need MRI L spine and/or emg/NCS  . Squamous cell carcinoma     Past Surgical History:  Procedure Laterality Date  . CARDIAC CATHETERIZATION  01/31/13   Stents to LAD patent; small diagonal stent with 90% ostial stenosis--good overall LV function with mild anterior and focal inferoapical hypokinesis, EF 60-65%.  Unsuccessful angioplasty of diagonal.  . CARDIOVASCULAR STRESS TEST  05/26/15   stress echo neg for ischemia  . CARDIOVASCULAR STRESS TEST  10/04/2017   Myoc perf imaging: normal (EF 59%)  .  COLONOSCOPY  2008; 06/14/16   2008 Normal.  2018 hyperplastic polyp +internal hemorrhoids.  Recall 2028.  Marland Kitchen HYDROCELE EXCISION / REPAIR  early 2000's  . INGUINAL HERNIA REPAIR Bilateral 1998  . PROSTATE BIOPSY     Multiple; all neg.  Prostate MRI 2018 -NO SIGN OF PROSTATE CA.  Marland Kitchen PTCA     Cath 2011---4 stents--LAD and diagonal.  2014 repeat cath for CP showed no changes.  . Scrotal u/s  01/2015   numerous extratesticular cystic lesions bilat, c/w numerous epididymal cysts vs loculated hydrocele--following expectantly.    There were no vitals filed for this visit.   Subjective Assessment - 11/27/17 1332    Subjective  The patient returns to PT with a flare-up of right sided low back pain for no apparent reason.  He diid great with traction last time and would like to try it again.  His pain is rtaed at a 5/10 today.  Leaning to left decreases his pain and leaning to right increases his pain.      Pertinent History  H/o low backpain.    Patient Stated Goals  Get out of pain.    Currently in Pain?  Yes    Pain Score  5     Pain Location  Back    Pain Orientation  Right;Lower  Pain Descriptors / Indicators  Aching    Pain Type  Chronic pain    Pain Onset  More than a month ago    Pain Frequency  Constant    Aggravating Factors   See above.    Pain Relieving Factors  See above.         [facility] PT Assessment - 11/27/17 0001      Assessment   Medical Diagnosis  Chronic right SIJ pain.    Referring Provider  Shawnie Dapper MD    Onset Date/Surgical Date  -- 10/18.  Recent flare-up ~ 2 weeks ago.      Precautions   Precautions  None      Restrictions   Weight Bearing Restrictions  No      Balance Screen   Has the patient fallen in the past 6 months  No    Has the patient had a decrease in activity level because of a fear of falling?   No    Is the patient reluctant to leave their home because of a fear of falling?   No      Home Film/video editor  residence      Prior Function   Level of Independence  Independent      Posture/Postural Control   Posture/Postural Control  No significant limitations      ROM / Strength   AROM / PROM / Strength  AROM;Strength      AROM   Overall AROM Comments  Normal active lumbar flexion and extension= 17 degrees.      Strength   Overall Strength Comments  Normal LE strength.      Palpation   Palpation comment  Tender to palpation over right posterior sacral-iliac ligament.      Special Tests   Other special tests  (-)SLR and FABER testing today.  Right LE slightly shorter than left due to a left pelvic posterior rotation.      Ambulation/Gait   Gait Comments  WNL.                Objective measurements completed on examination: See above findings.      [facility] Adult PT Treatment/Exercise - 11/27/17 0001      Modalities   Modalities  Traction      Traction   Type of Traction  Lumbar    Min (lbs)  5    Max (lbs)  80    Hold Time  99    Rest Time  5    Time  15             PT Education - 11/27/17 1346    Education provided  Yes    Education Details  Sleeping posture with pillows between knees and hip flexor type stretch to produce a right pelvic rotation.    Person(s) Educated  Patient    Methods  Explanation;Demonstration    Comprehension  Verbalized understanding          PT Long Term Goals - 11/27/17 1348      PT LONG TERM GOAL #1   Title  Independent with an HEP.    Time  6    Period  Weeks    Status  New      PT LONG TERM GOAL #2   Title  Stand 30 minutes with pain not > 2/10.    Time  6    Period  Weeks    Status  New  PT LONG TERM GOAL #3   Title  Walk a community distance with pain not > 2/10.    Time  6    Period  Weeks    Status  New      PT LONG TERM GOAL #4   Title  Perform ADL's with pain not > 2-3/10.    Time  6    Period  Weeks    Status  New             Plan - 11/27/17 1341    Clinical Impression Statement   The patient presents to OPPT with c/o of right SIJ pain which is palpably tender.  His right leg is a bit shorther than the left due to a posterior rotation of the left pelvis.  He states his pain makes it difficult to complete chores.  Patient expected to do well with skilled physical therapy intervention to include traction which he responded well to in the past.    History and Personal Factors relevant to plan of care:  H/o LBP/SIJ pain.    Clinical Presentation  Stable    Clinical Decision Making  Low    Rehab Potential  Excellent    PT Frequency  2x / week    PT Duration  6 weeks    PT Treatment/Interventions  ADLs/Self Care Home Management;Cryotherapy;Electrical Stimulation;Ultrasound;Moist Heat;Therapeutic activities;Therapeutic exercise;Patient/family education;Manual techniques;Dry needling;Traction    PT Next Visit Plan  STW/M to right SIJ; adductor squeeze and resisted right hip flexion ro correct leg length discrepancy; Combo e'stim/U/S; can try int traction at 80 BW (up to 50-55% per patient tolerance); advanced core exercises.    Consulted and Agree with Plan of Care  Patient       Patient will benefit from skilled therapeutic intervention in order to improve the following deficits and impairments:  Pain, Decreased range of motion, Decreased activity tolerance  Visit Diagnosis: Chronic right-sided low back pain without sciatica - Plan: PT plan of care cert/re-cert     Problem List Patient Active Problem List   Diagnosis Date Noted  . Chest tightness 09/25/2017  . Sciatica of right side 04/13/2017  . Coronary artery disease involving native coronary artery of native heart without angina pectoris 10/03/2016  . Dyslipidemia 10/03/2016  . Coronary artery disease involving coronary bypass graft of native heart without angina pectoris 04/01/2016  . Essential hypertension 04/01/2016    Evonda Enge, [place] MPT 11/27/2017, 1:51 PM  [facility] [address] [place], [place], [address] Phone: [phone]   Fax:  [phone]  Name: Ricky Chavez MRN: [id number] Date of Birth: [birth year]

## 2017-11-29 ENCOUNTER — Ambulatory Visit: Payer: 59 | Admitting: Physical Therapy

## 2017-11-29 ENCOUNTER — Encounter: Payer: Self-pay | Admitting: Physical Therapy

## 2017-11-29 DIAGNOSIS — M5441 Lumbago with sciatica, right side: Secondary | ICD-10-CM

## 2017-11-29 DIAGNOSIS — M545 Low back pain: Secondary | ICD-10-CM | POA: Diagnosis not present

## 2017-11-29 DIAGNOSIS — G8929 Other chronic pain: Secondary | ICD-10-CM

## 2017-11-29 NOTE — Therapy (Signed)
[facility] [place], [place], [address] Phone: [phone]   Fax:  [phone]  Physical Therapy Treatment  Patient Details  Name: Ricky Chavez MRN: [id number] Date of Birth: 01/12/1956 Referring Provider: Shawnie Dapper MD   Encounter Date: 11/29/2017  PT End of Session - 11/29/17 1434    Visit Number  2    Number of Visits  12    Date for PT Re-Evaluation  01/08/18    PT Start Time  0146    PT Stop Time  0245    PT Time Calculation (min)  59 min       Past Medical History:  Diagnosis Date  . Allergic rhinitis   . BPH with obstruction/lower urinary tract symptoms   . Coronary artery disease    With preserved EF.  Needs DAPT lifetime due to high risk status.  . Elevated PSA onset @ 2009   Neg bx x 3, most recent 2009 ([facility] in [place]).  PSA stable at 3.98 12/01/14 and 4.61 on 07/25/15 per old records.  PSA 10 at Dr. Noland Fordyce ([facility]) 03/2016: flomax started and plan is to recheck PSA and f/u with urol in 6 mo.  Recheck PSA here 08/2016 was 10.1, then 9.2 on 08/30/16, 10.8 on 04/03/17 at urol f/u: MR prostate 04/2017->BPH. 11/2017 PSA 10.9.  Marland Kitchen GERD (gastroesophageal reflux disease)   . Hearing loss of both ears   . History of myocardial infarction 2011  . Hyperlipidemia   . Hypertension   . Insomnia   . Organic impotence   . 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  May need MRI L spine and/or emg/NCS  . Squamous cell carcinoma     Past Surgical History:  Procedure Laterality Date  . CARDIAC CATHETERIZATION  01/31/13   Stents to LAD patent; small diagonal stent with 90% ostial stenosis--good overall LV function with mild anterior and focal inferoapical hypokinesis, EF 60-65%.  Unsuccessful angioplasty of diagonal.  . CARDIOVASCULAR STRESS TEST  05/26/15   stress echo neg for ischemia  . CARDIOVASCULAR STRESS TEST  10/04/2017   Myoc perf imaging: normal  (EF 59%)  . COLONOSCOPY  2008; 06/14/16   2008 Normal.  2018 hyperplastic polyp +internal hemorrhoids.  Recall 2028.  Marland Kitchen HYDROCELE EXCISION / REPAIR  early 2000's  . INGUINAL HERNIA REPAIR Bilateral 1998  . PROSTATE BIOPSY     Multiple; all neg.  Prostate MRI 2018 -NO SIGN OF PROSTATE CA.  Marland Kitchen PTCA     Cath 2011---4 stents--LAD and diagonal.  2014 repeat cath for CP showed no changes.  . Scrotal u/s  01/2015   numerous extratesticular cystic lesions bilat, c/w numerous epididymal cysts vs loculated hydrocele--following expectantly.    There were no vitals filed for this visit.  Subjective Assessment - 11/29/17 1436    Subjective  The treatment helped.    Currently in Pain?  Yes    Pain Score  4     Pain Location  Back    Pain Orientation  Right;Lower    Pain Descriptors / Indicators  Aching    Pain Onset  More than a month ago                       [facility] Adult PT Treatment/Exercise - 11/29/17 0001      Modalities   Modalities  Electrical Stimulation;Moist Heat;Traction      Moist Heat Therapy   Number Minutes Moist  Heat  20 Minutes    Moist Heat Location  Lumbar Spine      Electrical Stimulation   Electrical Stimulation Location  Right low back/SIJ.    Electrical Stimulation Action  Constant Pre-mod.    Electrical Stimulation Parameters  80-150 Hz x 20 minutes.    Electrical Stimulation Goals  Pain      Traction   Type of Traction  Lumbar    Min (lbs)  5    Max (lbs)  85    Hold Time  99    Rest Time  5    Time  15      Manual Therapy   Manual Therapy  Soft tissue mobilization    Manual therapy comments  In prone:  STW/M to right SIJ and lower lumbar region x 10 minutes.                  PT Long Term Goals - 11/27/17 1348      PT LONG TERM GOAL #1   Title  Independent with an HEP.    Time  6    Period  Weeks    Status  New      PT LONG TERM GOAL #2   Title  Stand 30 minutes with pain not > 2/10.    Time  6    Period  Weeks     Status  New      PT LONG TERM GOAL #3   Title  Walk a community distance with pain not > 2/10.    Time  6    Period  Weeks    Status  New      PT LONG TERM GOAL #4   Title  Perform ADL's with pain not > 2-3/10.    Time  6    Period  Weeks    Status  New            Plan - 11/29/17 1446    Clinical Impression Statement  Patient responded very well to treatment today including a 5# increase in traction.  Palpable tenderness over right SIJ with good response to right SIJ.    PT Treatment/Interventions  ADLs/Self Care Home Management;Cryotherapy;Electrical Stimulation;Ultrasound;Moist Heat;Therapeutic activities;Therapeutic exercise;Patient/family education;Manual techniques;Dry needling;Traction    Consulted and Agree with Plan of Care  Patient       Patient will benefit from skilled therapeutic intervention in order to improve the following deficits and impairments:  Pain, Decreased range of motion, Decreased activity tolerance  Visit Diagnosis: Chronic right-sided low back pain without sciatica  Acute right-sided low back pain with right-sided sciatica     Problem List Patient Active Problem List   Diagnosis Date Noted  . Chest tightness 09/25/2017  . Sciatica of right side 04/13/2017  . Coronary artery disease involving native coronary artery of native heart without angina pectoris 10/03/2016  . Dyslipidemia 10/03/2016  . Coronary artery disease involving coronary bypass graft of native heart without angina pectoris 04/01/2016  . Essential hypertension 04/01/2016    Aedin Jeansonne, [place] MPT 11/29/2017, 2:48 PM  [facility] [address] [place], [place], [address] Phone: [phone]   Fax:  [phone]  Name: Ricky Chavez MRN: [id number] Date of Birth: [birth year]

## 2017-12-01 ENCOUNTER — Ambulatory Visit: Payer: 59 | Admitting: Physical Therapy

## 2017-12-01 ENCOUNTER — Encounter: Payer: Self-pay | Admitting: Physical Therapy

## 2017-12-01 DIAGNOSIS — M545 Low back pain: Principal | ICD-10-CM

## 2017-12-01 DIAGNOSIS — G8929 Other chronic pain: Secondary | ICD-10-CM

## 2017-12-01 DIAGNOSIS — M5441 Lumbago with sciatica, right side: Secondary | ICD-10-CM

## 2017-12-01 NOTE — Therapy (Signed)
[facility] [place], [place], [address] Phone: [phone]   Fax:  [phone]  Physical Therapy Treatment  Patient Details  Name: ROMY MCGUE MRN: [id number] Date of Birth: 04-Dec-1955 Referring Provider: Shawnie Dapper MD   Encounter Date: 12/01/2017  PT End of Session - 12/01/17 1159    Visit Number  3    Number of Visits  12    Date for PT Re-Evaluation  01/08/18    PT Start Time  0948    PT Stop Time  1041    PT Time Calculation (min)  53 min    Activity Tolerance  Patient tolerated treatment well    Behavior During Therapy  [facility] for tasks assessed/performed       Past Medical History:  Diagnosis Date  . Allergic rhinitis   . BPH with obstruction/lower urinary tract symptoms   . Coronary artery disease    With preserved EF.  Needs DAPT lifetime due to high risk status.  . Elevated PSA onset @ 2009   Neg bx x 3, most recent 2009 ([facility] in [place]).  PSA stable at 3.98 12/01/14 and 4.61 on 07/25/15 per old records.  PSA 10 at Dr. Noland Fordyce ([facility]) 03/2016: flomax started and plan is to recheck PSA and f/u with urol in 6 mo.  Recheck PSA here 08/2016 was 10.1, then 9.2 on 08/30/16, 10.8 on 04/03/17 at urol f/u: MR prostate 04/2017->BPH. 11/2017 PSA 10.9.  Marland Kitchen GERD (gastroesophageal reflux disease)   . Hearing loss of both ears   . History of myocardial infarction 2011  . Hyperlipidemia   . Hypertension   . Insomnia   . Organic impotence   . 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  May need MRI L spine and/or emg/NCS  . Squamous cell carcinoma     Past Surgical History:  Procedure Laterality Date  . CARDIAC CATHETERIZATION  01/31/13   Stents to LAD patent; small diagonal stent with 90% ostial stenosis--good overall LV function with mild anterior and focal inferoapical hypokinesis, EF 60-65%.  Unsuccessful angioplasty of diagonal.  . CARDIOVASCULAR STRESS  TEST  05/26/15   stress echo neg for ischemia  . CARDIOVASCULAR STRESS TEST  10/04/2017   Myoc perf imaging: normal (EF 59%)  . COLONOSCOPY  2008; 06/14/16   2008 Normal.  2018 hyperplastic polyp +internal hemorrhoids.  Recall 2028.  Marland Kitchen HYDROCELE EXCISION / REPAIR  early 2000's  . INGUINAL HERNIA REPAIR Bilateral 1998  . PROSTATE BIOPSY     Multiple; all neg.  Prostate MRI 2018 -NO SIGN OF PROSTATE CA.  Marland Kitchen PTCA     Cath 2011---4 stents--LAD and diagonal.  2014 repeat cath for CP showed no changes.  . Scrotal u/s  01/2015   numerous extratesticular cystic lesions bilat, c/w numerous epididymal cysts vs loculated hydrocele--following expectantly.    There were no vitals filed for this visit.  Subjective Assessment - 12/01/17 1157    Subjective  I'm doing better.    Patient Stated Goals  Get out of pain.    Pain Score  3     Pain Location  Back    Pain Orientation  Right;Lower    Pain Onset  More than a month ago                       [facility] Adult PT Treatment/Exercise - 12/01/17 0001      Modalities   Modalities  Electrical  Stimulation;Moist Heat;Traction;Ultrasound      Moist Heat Therapy   Number Minutes Moist Heat  20 Minutes    Moist Heat Location  Lumbar Spine      Electrical Stimulation   Electrical Stimulation Location  Right low back/SIJ    Electrical Stimulation Action  Constant Pre-mod.    Electrical Stimulation Parameters  80-150 Hz x 20 minutes.    Electrical Stimulation Goals  Pain      Ultrasound   Ultrasound Location  Right SIJ region.    Ultrasound Parameters  Combo e'stim/U/S at 1.50 W/CM2 x 8 minutes.    Ultrasound Goals  Pain      Traction   Type of Traction  Lumbar    Min (lbs)  5    Max (lbs)  90    Hold Time  99    Rest Time  5    Time  15                  PT Long Term Goals - 11/27/17 1348      PT LONG TERM GOAL #1   Title  Independent with an HEP.    Time  6    Period  Weeks    Status  New      PT LONG TERM  GOAL #2   Title  Stand 30 minutes with pain not > 2/10.    Time  6    Period  Weeks    Status  New      PT LONG TERM GOAL #3   Title  Walk a community distance with pain not > 2/10.    Time  6    Period  Weeks    Status  New      PT LONG TERM GOAL #4   Title  Perform ADL's with pain not > 2-3/10.    Time  6    Period  Weeks    Status  New            Plan - 12/01/17 1222    Clinical Impression Statement  Excellent response to treatment thus far with reduced pain after his session today.    PT Treatment/Interventions  ADLs/Self Care Home Management;Cryotherapy;Electrical Stimulation;Ultrasound;Moist Heat;Therapeutic activities;Therapeutic exercise;Patient/family education;Manual techniques;Dry needling;Traction    PT Next Visit Plan  STW/M to right SIJ; adductor squeeze and resisted right hip flexion ro correct leg length discrepancy; Combo e'stim/U/S; can try int traction at 80 BW (up to 50-55% per patient tolerance); advanced core exercises.    Consulted and Agree with Plan of Care  Patient       Patient will benefit from skilled therapeutic intervention in order to improve the following deficits and impairments:  Pain, Decreased range of motion, Decreased activity tolerance  Visit Diagnosis: Chronic right-sided low back pain without sciatica  Acute right-sided low back pain with right-sided sciatica     Problem List Patient Active Problem List   Diagnosis Date Noted  . Chest tightness 09/25/2017  . Sciatica of right side 04/13/2017  . Coronary artery disease involving native coronary artery of native heart without angina pectoris 10/03/2016  . Dyslipidemia 10/03/2016  . Coronary artery disease involving coronary bypass graft of native heart without angina pectoris 04/01/2016  . Essential hypertension 04/01/2016    Jemina Scahill, [place] MPT 12/01/2017, 12:23 PM  [facility] [address] [place], [place], [address] Phone:  [phone]   Fax:  [phone]  Name: ENGLISH CRAIGHEAD MRN: [id number] Date of Birth:  05/12/1956   

## 2017-12-05 ENCOUNTER — Ambulatory Visit: Payer: 59 | Attending: Family Medicine | Admitting: *Deleted

## 2017-12-05 DIAGNOSIS — M545 Low back pain, unspecified: Secondary | ICD-10-CM

## 2017-12-05 DIAGNOSIS — G8929 Other chronic pain: Secondary | ICD-10-CM | POA: Insufficient documentation

## 2017-12-05 DIAGNOSIS — M5441 Lumbago with sciatica, right side: Secondary | ICD-10-CM | POA: Diagnosis present

## 2017-12-05 NOTE — Therapy (Signed)
[facility] [place], [place], [address] Phone: [phone]   Fax:  [phone]  Physical Therapy Treatment  Patient Details  Name: Ricky Chavez MRN: [id number] Date of Birth: 05/11/1956 Referring Provider: Shawnie Dapper MD   Encounter Date: 12/05/2017  PT End of Session - 12/05/17 1308    Visit Number  4    Number of Visits  12    Date for PT Re-Evaluation  01/08/18    PT Start Time  1300    PT Stop Time  1350    PT Time Calculation (min)  50 min       Past Medical History:  Diagnosis Date  . Allergic rhinitis   . BPH with obstruction/lower urinary tract symptoms   . Coronary artery disease    With preserved EF.  Needs DAPT lifetime due to high risk status.  . Elevated PSA onset @ 2009   Neg bx x 3, most recent 2009 ([facility] in [place]).  PSA stable at 3.98 12/01/14 and 4.61 on 07/25/15 per old records.  PSA 10 at Dr. N[facility]and Fordyce ([facility]) 03/2016: flomax started and plan is to recheck PSA and f/u with urol in 6 mo.  Recheck PSA here 08/2016 was 10.1, then 9.2 on 08/30/16, 10.8 on 04/03/17 at urol f/u: MR prostate 04/2017->BPH. 11/2017 PSA 10.9.  Marland Kitchen GERD (gastroesophageal reflux disease)   . Hearing loss of both ears   . History of myocardial infarction 2011  . Hyperlipidemia   . Hypertension   . Insomnia   . Organic impotence   . 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  May need MRI L spine and/or emg/NCS  . Squamous cell carcinoma     Past Surgical History:  Procedure Laterality Date  . CARDIAC CATHETERIZATION  01/31/13   Stents to LAD patent; small diagonal stent with 90% ostial stenosis--good overall LV function with mild anterior and focal inferoapical hypokinesis, EF 60-65%.  Unsuccessful angioplasty of diagonal.  . CARDIOVASCULAR STRESS TEST  05/26/15   stress echo neg for ischemia  . CARDIOVASCULAR STRESS TEST  10/04/2017   Myoc perf imaging: normal  (EF 59%)  . COLONOSCOPY  2008; 06/14/16   2008 Normal.  2018 hyperplastic polyp +internal hemorrhoids.  Recall 2028.  Marland Kitchen HYDROCELE EXCISION / REPAIR  early 2000's  . INGUINAL HERNIA REPAIR Bilateral 1998  . PROSTATE BIOPSY     Multiple; all neg.  Prostate MRI 2018 -NO SIGN OF PROSTATE CA.  Marland Kitchen PTCA     Cath 2011---4 stents--LAD and diagonal.  2014 repeat cath for CP showed no changes.  . Scrotal u/s  01/2015   numerous extratesticular cystic lesions bilat, c/w numerous epididymal cysts vs loculated hydrocele--following expectantly.    There were no vitals filed for this visit.  Subjective Assessment - 12/05/17 1302    Subjective  I'm doing better. RT side SIJ    Pertinent History  H/o low backpain.    Patient Stated Goals  Get out of pain.    Currently in Pain?  Yes    Pain Score  4     Pain Location  Back    Pain Orientation  Right;Lower    Pain Descriptors / Indicators  Aching    Pain Type  Chronic pain    Pain Onset  Chavez than a month ago    Pain Frequency  Constant  [facility] Adult PT Treatment/Exercise - 12/05/17 0001      Modalities   Modalities  Electrical Stimulation;Moist Heat;Traction;Ultrasound      Moist Heat Therapy   Number Minutes Moist Heat  15 Minutes    Moist Heat Location  Lumbar Spine      Electrical Stimulation   Electrical Stimulation Location  Right low back/SIJ premod 80-150hz  x 15 mins    Electrical Stimulation Goals  Pain      Ultrasound   Ultrasound Location  RT SIJ    Ultrasound Parameters  Combo/Estim/US x 8 mins 1.5 w/cm2     Ultrasound Goals  Pain      Traction   Type of Traction  Lumbar    Min (lbs)  5    Max (lbs)  90    Hold Time  99    Rest Time  5    Time  15      Manual Therapy   Manual Therapy  Joint mobilization    Joint Mobilization  RT side SIJ mob performed by [place] Applegate MPT                  PT Long Term Goals - 11/27/17 1348      PT LONG TERM GOAL #1   Title   Independent with an HEP.    Time  6    Period  Weeks    Status  New      PT LONG TERM GOAL #2   Title  Stand 30 minutes with pain not > 2/10.    Time  6    Period  Weeks    Status  New      PT LONG TERM GOAL #3   Title  Walk a community distance with pain not > 2/10.    Time  6    Period  Weeks    Status  New      PT LONG TERM GOAL #4   Title  Perform ADL's with pain not > 2-3/10.    Time  6    Period  Weeks    Status  New            Plan - 12/05/17 1401    Clinical Impression Statement  Pt arrived today doing fairly well. He reports that pain is intermittent and position dependent at times. Rt SIJ mobs performed by MPT and tolerated well. Pelvic traction was performed at 95#s today with no complaints. Normal response to modalities    Clinical Presentation  Stable    Rehab Potential  Excellent    PT Frequency  2x / week    PT Duration  6 weeks    PT Treatment/Interventions  ADLs/Self Care Home Management;Cryotherapy;Electrical Stimulation;Ultrasound;Moist Heat;Therapeutic activities;Therapeutic exercise;Patient/family education;Manual techniques;Dry needling;Traction    PT Next Visit Plan  STW/M to right SIJ; adductor squeeze and resisted right hip flexion ro correct leg length discrepancy; Combo e'stim/U/S; can try int traction at 80 BW (up to 50-55% per patient tolerance); advanced core exercises.       Patient will benefit from skilled therapeutic intervention in order to improve the following deficits and impairments:  Pain, Decreased range of motion, Decreased activity tolerance  Visit Diagnosis: Chronic right-sided low back pain without sciatica  Acute right-sided low back pain with right-sided sciatica     Problem List Patient Active Problem List   Diagnosis Date Noted  . Chest tightness 09/25/2017  . Sciatica of right side 04/13/2017  . Coronary artery disease involving  native coronary artery of native heart without angina pectoris 10/03/2016  .  Dyslipidemia 10/03/2016  . Coronary artery disease involving coronary bypass graft of native heart without angina pectoris 04/01/2016  . Essential hypertension 04/01/2016    Ricky Chavez,Ricky Chavez, Ricky Chavez 12/05/2017, 2:20 PM  [facility] [address] [place], [place], [address] Phone: [phone]   Fax:  [phone]  Name: Ricky Chavez MRN: [id number] Date of Birth: 08/09/1955

## 2017-12-11 ENCOUNTER — Encounter: Payer: 59 | Admitting: Physical Therapy

## 2017-12-12 ENCOUNTER — Encounter: Payer: 59 | Admitting: Physical Therapy

## 2017-12-14 ENCOUNTER — Ambulatory Visit: Payer: 59 | Admitting: Physical Therapy

## 2017-12-14 ENCOUNTER — Encounter: Payer: Self-pay | Admitting: Physical Therapy

## 2017-12-14 DIAGNOSIS — M5441 Lumbago with sciatica, right side: Secondary | ICD-10-CM

## 2017-12-14 DIAGNOSIS — M545 Low back pain, unspecified: Secondary | ICD-10-CM

## 2017-12-14 DIAGNOSIS — G8929 Other chronic pain: Secondary | ICD-10-CM

## 2017-12-14 NOTE — Therapy (Signed)
[facility] [place], [place], [address] Phone: [phone]   Fax:  [phone]  Physical Therapy Treatment  Patient Details  Name: Ricky Chavez MRN: [id number] Date of Birth: Jul 28, 1955 Referring Provider: Shawnie Dapper MD   Encounter Date: 12/14/2017  PT End of Session - 12/14/17 1611    Visit Number  5    Number of Visits  12    Date for PT Re-Evaluation  01/08/18    PT Start Time  0317    PT Stop Time  0416    PT Time Calculation (min)  59 min    Activity Tolerance  Patient tolerated treatment well    Behavior During Therapy  [facility] for tasks assessed/performed       Past Medical History:  Diagnosis Date  . Allergic rhinitis   . BPH with obstruction/lower urinary tract symptoms   . Coronary artery disease    With preserved EF.  Needs DAPT lifetime due to high risk status.  . Elevated PSA onset @ 2009   Neg bx x 3, most recent 2009 ([place] in [place]).  PSA stable at 3.98 12/01/14 and 4.61 on 07/25/15 per old records.  PSA 10 at Dr. [facility]and [facility]ce ([facility]) 03/2016: flomax started and plan is to recheck PSA and f/u with urol in 6 mo.  Recheck PSA here 08/2016 was 10.1, then 9.2 on 08/30/16, 10.8 on 04/03/17 at urol f/u: MR prostate 04/2017->BPH. 11/2017 PSA 10.9.  Marland Kitchen GERD (gastroesophageal reflux disease)   . Hearing loss of both ears   . History of myocardial infarction 2011  . Hyperlipidemia   . Hypertension   . Insomnia   . Organic impotence   . 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  May need MRI L spine and/or emg/NCS  . Squamous cell carcinoma     Past Surgical History:  Procedure Laterality Date  . CARDIAC CATHETERIZATION  01/31/13   Stents to LAD patent; small diagonal stent with 90% ostial stenosis--good overall LV function with mild anterior and focal inferoapical hypokinesis, EF 60-65%.  Unsuccessful angioplasty of diagonal.  . CARDIOVASCULAR STRESS  TEST  05/26/15   stress echo neg for ischemia  . CARDIOVASCULAR STRESS TEST  10/04/2017   Myoc perf imaging: normal (EF 59%)  . COLONOSCOPY  2008; 06/14/16   2008 Normal.  2018 hyperplastic polyp +internal hemorrhoids.  Recall 2028.  Marland Kitchen HYDROCELE EXCISION / REPAIR  early 2000's  . INGUINAL HERNIA REPAIR Bilateral 1998  . PROSTATE BIOPSY     Multiple; all neg.  Prostate MRI 2018 -NO SIGN OF PROSTATE CA.  Marland Kitchen PTCA     Cath 2011---4 stents--LAD and diagonal.  2014 repeat cath for CP showed no changes.  . Scrotal u/s  01/2015   numerous extratesticular cystic lesions bilat, c/w numerous epididymal cysts vs loculated hydrocele--following expectantly.    There were no vitals filed for this visit.  Subjective Assessment - 12/14/17 1614    Subjective  I am better and can do what I need to do around the house but I do it in pain.    Currently in Pain?  Yes    Pain Score  4     Pain Location  Back    Pain Orientation  Right    Pain Type  Chronic pain    Pain Onset  More than a month ago  [facility] Adult PT Treatment/Exercise - 12/14/17 0001      Modalities   Modalities  Electrical Stimulation;Traction;Ultrasound      Ultrasound   Ultrasound Location  Right SIJ    Ultrasound Parameters  Combo e'stim/U/S at 1.50 W/CM2 x 12 minutes.    Ultrasound Goals  Pain      Traction   Type of Traction  Lumbar    Min (lbs)  5    Max (lbs)  95    Hold Time  99    Rest Time  5    Time  15      Manual Therapy   Manual Therapy  Soft tissue mobilization    Soft tissue mobilization  STW/M x 11 minutes to right SIJ and QL region.                  PT Long Term Goals - 11/27/17 1348      PT LONG TERM GOAL #1   Title  Independent with an HEP.    Time  6    Period  Weeks    Status  New      PT LONG TERM GOAL #2   Title  Stand 30 minutes with pain not > 2/10.    Time  6    Period  Weeks    Status  New      PT LONG TERM GOAL #3   Title  Walk a  community distance with pain not > 2/10.    Time  6    Period  Weeks    Status  New      PT LONG TERM GOAL #4   Title  Perform ADL's with pain not > 2-3/10.    Time  6    Period  Weeks    Status  New            Plan - 12/14/17 1623    Clinical Impression Statement  Patient pleased with progress but continues to have pain in the right SIJ region.  Patient did well with a 5# increase in lumbar traction today.    PT Treatment/Interventions  ADLs/Self Care Home Management;Cryotherapy;Electrical Stimulation;Ultrasound;Moist Heat;Therapeutic activities;Therapeutic exercise;Patient/family education;Manual techniques;Dry needling;Traction    Consulted and Agree with Plan of Care  Patient       Patient will benefit from skilled therapeutic intervention in order to improve the following deficits and impairments:     Visit Diagnosis: Chronic right-sided low back pain without sciatica  Acute right-sided low back pain with right-sided sciatica     Problem List Patient Active Problem List   Diagnosis Date Noted  . Chest tightness 09/25/2017  . Sciatica of right side 04/13/2017  . Coronary artery disease involving native coronary artery of native heart without angina pectoris 10/03/2016  . Dyslipidemia 10/03/2016  . Coronary artery disease involving coronary bypass graft of native heart without angina pectoris 04/01/2016  . Essential hypertension 04/01/2016    [place] Rollyson, [place] MPT 12/14/2017, 4:27 PM  [facility] [address] [place], [place], [address] Phone: [phone]   Fax:  [phone]  Name: GRIFFEN FRAYNE MRN: [id number] Date of Birth: 1956-03-01

## 2017-12-18 ENCOUNTER — Encounter: Payer: Self-pay | Admitting: Physical Therapy

## 2017-12-18 ENCOUNTER — Ambulatory Visit: Payer: 59 | Admitting: Physical Therapy

## 2017-12-18 DIAGNOSIS — M545 Low back pain: Principal | ICD-10-CM

## 2017-12-18 DIAGNOSIS — G8929 Other chronic pain: Secondary | ICD-10-CM

## 2017-12-18 NOTE — Therapy (Signed)
[facility] [employer], [place], [address] Phone: [phone]   Fax:  [phone]  Physical Therapy Treatment  Patient Details  Name: Ricky Chavez MRN: [id number] Date of Birth: 15-Feb-1956 Referring Provider: Shawnie Dapper MD   Encounter Date: 12/18/2017  PT End of Session - 12/18/17 1514    Visit Number  6    Number of Visits  12    Date for PT Re-Evaluation  01/08/18    PT Start Time  5397    PT Stop Time  1552 2 units due to limitation of pain    PT Time Calculation (min)  38 min    Activity Tolerance  Patient tolerated treatment well    Behavior During Therapy  [employer] for tasks assessed/performed       Past Medical History:  Diagnosis Date  . Allergic rhinitis   . BPH with obstruction/lower urinary tract symptoms   . Coronary artery disease    With preserved EF.  Needs DAPT lifetime due to high risk status.  . Elevated PSA onset @ 2009   Neg bx x 3, most recent 2009 ([place] in [place]).  PSA stable at 3.98 12/01/14 and 4.61 on 07/25/15 per old records.  PSA 10 at Dr. Noland Fordyce ([facility]) 03/2016: flomax started and plan is to recheck PSA and f/u with urol in 6 mo.  Recheck PSA here 08/2016 was 10.1, then 9.2 on 08/30/16, 10.8 on 04/03/17 at urol f/u: MR prostate 04/2017->BPH. 11/2017 PSA 10.9.  Marland Kitchen GERD (gastroesophageal reflux disease)   . Hearing loss of both ears   . History of myocardial infarction 2011  . Hyperlipidemia   . Hypertension   . Insomnia   . Organic impotence   . 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  May need MRI L spine and/or emg/NCS  . Squamous cell carcinoma     Past Surgical History:  Procedure Laterality Date  . CARDIAC CATHETERIZATION  01/31/13   Stents to LAD patent; small diagonal stent with 90% ostial stenosis--good overall LV function with mild anterior and focal inferoapical hypokinesis, EF 60-65%.  Unsuccessful angioplasty of  diagonal.  . CARDIOVASCULAR STRESS TEST  05/26/15   stress echo neg for ischemia  . CARDIOVASCULAR STRESS TEST  10/04/2017   Myoc perf imaging: normal (EF 59%)  . COLONOSCOPY  2008; 06/14/16   2008 Normal.  2018 hyperplastic polyp +internal hemorrhoids.  Recall 2028.  Marland Kitchen HYDROCELE EXCISION / REPAIR  early 2000's  . INGUINAL HERNIA REPAIR Bilateral 1998  . PROSTATE BIOPSY     Multiple; all neg.  Prostate MRI 2018 -NO SIGN OF PROSTATE CA.  Marland Kitchen PTCA     Cath 2011---4 stents--LAD and diagonal.  2014 repeat cath for CP showed no changes.  . Scrotal u/s  01/2015   numerous extratesticular cystic lesions bilat, c/w numerous epididymal cysts vs loculated hydrocele--following expectantly.    There were no vitals filed for this visit.  Subjective Assessment - 12/18/17 1512    Subjective  Reports that his back is better and reports that he went until after 12 pm without thinking of his pain.    Pertinent History  H/o low backpain.    Patient Stated Goals  Get out of pain.    Currently in Pain?  Yes    Pain Score  2     Pain Location  Back    Pain Orientation  Right;Lower    Pain Descriptors / Indicators  Aching  Pain Type  Chronic pain    Pain Radiating Towards  down lateral RLE to R ankle    Pain Onset  More than a month ago    Pain Frequency  Intermittent         [facility] PT Assessment - 12/18/17 0001      Assessment   Medical Diagnosis  Chronic right SIJ pain.      Precautions   Precautions  None      Restrictions   Weight Bearing Restrictions  No                   [facility] Adult PT Treatment/Exercise - 12/18/17 0001      Modalities   Modalities  Traction;Ultrasound      Ultrasound   Ultrasound Location  R SI joint    Ultrasound Parameters  1.5 w/cm2, 100%, 1 mhz x10 min    Ultrasound Goals  Pain      Traction   Type of Traction  Lumbar    Min (lbs)  5    Max (lbs)  95    Hold Time  99    Rest Time  5    Time  15      Manual Therapy   Manual Therapy  Soft  tissue mobilization    Soft tissue mobilization  STW to R SI joint, superiolateral glute, inferior lumbar paraspinals in prone to reduce pain                  PT Long Term Goals - 11/27/17 1348      PT LONG TERM GOAL #1   Title  Independent with an HEP.    Time  6    Period  Weeks    Status  New      PT LONG TERM GOAL #2   Title  Stand 30 minutes with pain not > 2/10.    Time  6    Period  Weeks    Status  New      PT LONG TERM GOAL #3   Title  Walk a community distance with pain not > 2/10.    Time  6    Period  Weeks    Status  New      PT LONG TERM GOAL #4   Title  Perform ADL's with pain not > 2-3/10.    Time  6    Period  Weeks    Status  New            Plan - 12/18/17 1655    Clinical Impression Statement  Patient arrived with reports of less R LBP and radiating pain to R ankle. Patient limited with manual therapy session secondary to pain especially just superior to R SI joint and in superiolateral R glute. Minimal tenderness reported by patient with manual therapy to R SI joint. Traction maintained at 95# as patient feels 95# is comfortable. Patient did not report any pain or discomfort following end of session.    Rehab Potential  Excellent    PT Frequency  2x / week    PT Duration  6 weeks    PT Treatment/Interventions  ADLs/Self Care Home Management;Cryotherapy;Electrical Stimulation;Ultrasound;Moist Heat;Therapeutic activities;Therapeutic exercise;Patient/family education;Manual techniques;Dry needling;Traction    PT Next Visit Plan  STW/M to right SIJ; adductor squeeze and resisted right hip flexion ro correct leg length discrepancy; Combo e'stim/U/S; can try int traction at 80 BW (up to 50-55% per patient tolerance); advanced core exercises.  Consulted and Agree with Plan of Care  Patient       Patient will benefit from skilled therapeutic intervention in order to improve the following deficits and impairments:  Pain, Decreased range of  motion, Decreased activity tolerance  Visit Diagnosis: Chronic right-sided low back pain without sciatica     Problem List Patient Active Problem List   Diagnosis Date Noted  . Chest tightness 09/25/2017  . Sciatica of right side 04/13/2017  . Coronary artery disease involving native coronary artery of native heart without angina pectoris 10/03/2016  . Dyslipidemia 10/03/2016  . Coronary artery disease involving coronary bypass graft of native heart without angina pectoris 04/01/2016  . Essential hypertension 04/01/2016    Standley Brooking, PTA 12/18/2017, 4:58 PM  [facility] [address] [place], [place], [address] Phone: [phone]   Fax:  [phone]  Name: Ricky Chavez MRN: [id number] Date of Birth: [birth year]

## 2017-12-21 ENCOUNTER — Encounter: Payer: 59 | Admitting: *Deleted

## 2017-12-26 ENCOUNTER — Encounter: Payer: Self-pay | Admitting: Physical Therapy

## 2017-12-26 ENCOUNTER — Ambulatory Visit: Payer: 59 | Admitting: Physical Therapy

## 2017-12-26 DIAGNOSIS — G8929 Other chronic pain: Secondary | ICD-10-CM

## 2017-12-26 DIAGNOSIS — M545 Low back pain, unspecified: Secondary | ICD-10-CM

## 2017-12-26 NOTE — Therapy (Signed)
[facility] [employer], [place], [address] Phone: [phone]   Fax:  [phone]  Physical Therapy Treatment  Patient Details  Name: Ricky Chavez MRN: [id number] Date of Birth: 05-15-1956 Referring Provider: Shawnie Dapper MD   Encounter Date: 12/26/2017  PT End of Session - 12/26/17 1303    Visit Number  7    Number of Visits  12    Date for PT Re-Evaluation  01/08/18    PT Start Time  1301    PT Stop Time  1351    PT Time Calculation (min)  50 min    Activity Tolerance  Patient tolerated treatment well    Behavior During Therapy  [facility] for tasks assessed/performed       Past Medical History:  Diagnosis Date  . Allergic rhinitis   . BPH with obstruction/lower urinary tract symptoms   . Coronary artery disease    With preserved EF.  Needs DAPT lifetime due to high risk status.  . Elevated PSA onset @ 2009   Neg bx x 3, most recent 2009 ([place] in [place]).  PSA stable at 3.98 12/01/14 and 4.61 on 07/25/15 per old records.  PSA 10 at Dr. Noland Fordyce ([facility]) 03/2016: flomax started and plan is to recheck PSA and f/u with urol in 6 mo.  Recheck PSA here 08/2016 was 10.1, then 9.2 on 08/30/16, 10.8 on 04/03/17 at urol f/u: MR prostate 04/2017->BPH. 11/2017 PSA 10.9.  Marland Kitchen GERD (gastroesophageal reflux disease)   . Hearing loss of both ears   . History of myocardial infarction 2011  . Hyperlipidemia   . Hypertension   . Insomnia   . Organic impotence   . 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  May need MRI L spine and/or emg/NCS  . Squamous cell carcinoma     Past Surgical History:  Procedure Laterality Date  . CARDIAC CATHETERIZATION  01/31/13   Stents to LAD patent; small diagonal stent with 90% ostial stenosis--good overall LV function with mild anterior and focal inferoapical hypokinesis, EF 60-65%.  Unsuccessful angioplasty of diagonal.  . CARDIOVASCULAR STRESS  TEST  05/26/15   stress echo neg for ischemia  . CARDIOVASCULAR STRESS TEST  10/04/2017   Myoc perf imaging: normal (EF 59%)  . COLONOSCOPY  2008; 06/14/16   2008 Normal.  2018 hyperplastic polyp +internal hemorrhoids.  Recall 2028.  Marland Kitchen HYDROCELE EXCISION / REPAIR  early 2000's  . INGUINAL HERNIA REPAIR Bilateral 1998  . PROSTATE BIOPSY     Multiple; all neg.  Prostate MRI 2018 -NO SIGN OF PROSTATE CA.  Marland Kitchen PTCA     Cath 2011---4 stents--LAD and diagonal.  2014 repeat cath for CP showed no changes.  . Scrotal u/s  01/2015   numerous extratesticular cystic lesions bilat, c/w numerous epididymal cysts vs loculated hydrocele--following expectantly.    There were no vitals filed for this visit.  Subjective Assessment - 12/26/17 1300    Subjective  Reports that his back is better today. Reports that he does not currently have pain or tightness in low back. Reports that 10 minutes is his max for standing comfortably.    Pertinent History  H/o low backpain.    Patient Stated Goals  Get out of pain.    Currently in Pain?  No/denies         [facility] PT Assessment - 12/26/17 0001      Assessment   Medical Diagnosis  Chronic right SIJ pain.  Precautions   Precautions  None      Restrictions   Weight Bearing Restrictions  No                   [facility] Adult PT Treatment/Exercise - 12/26/17 0001      Exercises   Exercises  Lumbar      Lumbar Exercises: Stretches   Figure 4 Stretch  2 reps;30 seconds;Supine;Without overpressure      Lumbar Exercises: Aerobic   Nustep  L5 x11 min with core activation      Lumbar Exercises: Supine   Clam  15 reps;3 seconds;Limitations    Clam Limitations  green theraband with core activation    Bridge  15 reps;5 seconds    Straight Leg Raise  15 reps;Limitations RLE with core activation    Straight Leg Raises Limitations  + pain at R greater trochanter with elevation      Modalities   Modalities  Traction      Traction   Type of  Traction  Lumbar    Min (lbs)  5    Max (lbs)  95    Hold Time  99    Rest Time  5    Time  15                  PT Long Term Goals - 12/26/17 1307      PT LONG TERM GOAL #1   Title  Independent with an HEP.    Time  6    Period  Weeks    Status  On-going      PT LONG TERM GOAL #2   Title  Stand 30 minutes with pain not > 2/10.    Time  6    Period  Weeks    Status  On-going 10 min max 12/26/2017      PT LONG TERM GOAL #3   Title  Walk a community distance with pain not > 2/10.    Time  6    Period  Weeks    Status  On-going      PT LONG TERM GOAL #4   Title  Perform ADL's with pain not > 2-3/10.    Time  6    Period  Weeks    Status  On-going            Plan - 12/26/17 1339    Clinical Impression Statement  Patient tolerated today's treatment well with no pain reported upon arrival. Patient able to tolerate low level R hip stretching along with strengthening exercises with lumbar and core focus. Only complaint reported by patient during therex session was of discomfort at R greater trochanter region with RLE elevation during SLR. Traction maintained at 25# max again today.    Rehab Potential  Excellent    PT Frequency  2x / week    PT Duration  6 weeks    PT Treatment/Interventions  ADLs/Self Care Home Management;Cryotherapy;Electrical Stimulation;Ultrasound;Moist Heat;Therapeutic activities;Therapeutic exercise;Patient/family education;Manual techniques;Dry needling;Traction    PT Next Visit Plan  STW/M to right SIJ; adductor squeeze and resisted right hip flexion ro correct leg length discrepancy; Combo e'stim/U/S; can try int traction at 80 BW (up to 50-55% per patient tolerance); advanced core exercises.    Consulted and Agree with Plan of Care  Patient       Patient will benefit from skilled therapeutic intervention in order to improve the following deficits and impairments:  Pain, Decreased range of motion, Decreased  activity tolerance  Visit  Diagnosis: Chronic right-sided low back pain without sciatica     Problem List Patient Active Problem List   Diagnosis Date Noted  . Chest tightness 09/25/2017  . Sciatica of right side 04/13/2017  . Coronary artery disease involving native coronary artery of native heart without angina pectoris 10/03/2016  . Dyslipidemia 10/03/2016  . Coronary artery disease involving coronary bypass graft of native heart without angina pectoris 04/01/2016  . Essential hypertension 04/01/2016    Standley Brooking, PTA 12/26/2017, 1:53 PM  [facility] [address] [place], [place], [address] Phone: [phone]   Fax:  [phone]  Name: Ricky Chavez MRN: [id number] Date of Birth: [birth year]

## 2017-12-28 ENCOUNTER — Ambulatory Visit: Payer: 59 | Admitting: *Deleted

## 2017-12-28 DIAGNOSIS — G8929 Other chronic pain: Secondary | ICD-10-CM

## 2017-12-28 DIAGNOSIS — M545 Low back pain, unspecified: Secondary | ICD-10-CM

## 2017-12-28 DIAGNOSIS — M5441 Lumbago with sciatica, right side: Secondary | ICD-10-CM

## 2017-12-28 NOTE — Therapy (Signed)
[facility] [place], [place], [address] Phone: [phone]   Fax:  [phone]  Physical Therapy Treatment  Patient Details  Name: Ricky Chavez MRN: [id number] Date of Birth: Mar 23, 1956 Referring Provider: Shawnie Dapper MD   Encounter Date: 12/28/2017  PT End of Session - 12/28/17 1559    Visit Number  8    Number of Visits  12    Date for PT Re-Evaluation  01/08/18    PT Start Time  6962    PT Stop Time  1605    PT Time Calculation (min)  50 min       Past Medical History:  Diagnosis Date  . Allergic rhinitis   . BPH with obstruction/lower urinary tract symptoms   . Coronary artery disease    With preserved EF.  Needs DAPT lifetime due to high risk status.  . Elevated PSA onset @ 2009   Neg bx x 3, most recent 2009 ([place] in [place]).  PSA stable at 3.98 12/01/14 and 4.61 on 07/25/15 per old records.  PSA 10 at Dr. Noland Fordyce ([facility]) 03/2016: flomax started and plan is to recheck PSA and f/u with urol in 6 mo.  Recheck PSA here 08/2016 was 10.1, then 9.2 on 08/30/16, 10.8 on 04/03/17 at urol f/u: MR prostate 04/2017->BPH. 11/2017 PSA 10.9.  Marland Kitchen GERD (gastroesophageal reflux disease)   . Hearing loss of both ears   . History of myocardial infarction 2011  . Hyperlipidemia   . Hypertension   . Insomnia   . Organic impotence   . 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  May need MRI L spine and/or emg/NCS  . Squamous cell carcinoma     Past Surgical History:  Procedure Laterality Date  . CARDIAC CATHETERIZATION  01/31/13   Stents to LAD patent; small diagonal stent with 90% ostial stenosis--good overall LV function with mild anterior and focal inferoapical hypokinesis, EF 60-65%.  Unsuccessful angioplasty of diagonal.  . CARDIOVASCULAR STRESS TEST  05/26/15   stress echo neg for ischemia  . CARDIOVASCULAR STRESS TEST  10/04/2017   Myoc perf imaging: normal  (EF 59%)  . COLONOSCOPY  2008; 06/14/16   2008 Normal.  2018 hyperplastic polyp +internal hemorrhoids.  Recall 2028.  Marland Kitchen HYDROCELE EXCISION / REPAIR  early 2000's  . INGUINAL HERNIA REPAIR Bilateral 1998  . PROSTATE BIOPSY     Multiple; all neg.  Prostate MRI 2018 -NO SIGN OF PROSTATE CA.  Marland Kitchen PTCA     Cath 2011---4 stents--LAD and diagonal.  2014 repeat cath for CP showed no changes.  . Scrotal u/s  01/2015   numerous extratesticular cystic lesions bilat, c/w numerous epididymal cysts vs loculated hydrocele--following expectantly.    There were no vitals filed for this visit.                    [facility] Adult PT Treatment/Exercise - 12/28/17 0001      Exercises   Exercises  Lumbar      Lumbar Exercises: Aerobic   Nustep  L5 x 15  min with core activation      Lumbar Exercises: Supine   Bridge  15 reps;5 seconds      Lumbar Exercises: Sidelying   Hip Abduction  20 reps;2 seconds;Right      Modalities   Modalities  Traction      Traction   Type of Traction  Lumbar    Min (lbs)  5  Max (lbs)  95    Hold Time  99    Rest Time  5    Time  15                  PT Long Term Goals - 12/26/17 1307      PT LONG TERM GOAL #1   Title  Independent with an HEP.    Time  6    Period  Weeks    Status  On-going      PT LONG TERM GOAL #2   Title  Stand 30 minutes with pain not > 2/10.    Time  6    Period  Weeks    Status  On-going 10 min max 12/26/2017      PT LONG TERM GOAL #3   Title  Walk a community distance with pain not > 2/10.    Time  6    Period  Weeks    Status  On-going      PT LONG TERM GOAL #4   Title  Perform ADL's with pain not > 2-3/10.    Time  6    Period  Weeks    Status  On-going            Plan - 12/28/17 1529    Clinical Impression Statement  Pt arrived today doing fairly well and reports that he feels about 75% better now since starting PT. He was able to tolerate therex today without complaints of any increased  pain.  Traction was performed at 95#s again and tolerated well.. RT hip abduction strengthening was added to HEP.    Clinical Presentation  Stable    Clinical Decision Making  Low    Rehab Potential  Excellent    PT Frequency  2x / week    PT Duration  6 weeks    PT Treatment/Interventions  ADLs/Self Care Home Management;Cryotherapy;Electrical Stimulation;Ultrasound;Moist Heat;Therapeutic activities;Therapeutic exercise;Patient/family education;Manual techniques;Dry needling;Traction    PT Next Visit Plan  STW/M to right SIJ; adductor squeeze and resisted right hip flexion ro correct leg length discrepancy; Combo e'stim/U/S; can try int traction at 80 BW (up to 50-55% per patient tolerance); advanced core exercises.    Consulted and Agree with Plan of Care  Patient       Patient will benefit from skilled therapeutic intervention in order to improve the following deficits and impairments:  Pain, Decreased range of motion, Decreased activity tolerance  Visit Diagnosis: Chronic right-sided low back pain without sciatica  Acute right-sided low back pain with right-sided sciatica     Problem List Patient Active Problem List   Diagnosis Date Noted  . Chest tightness 09/25/2017  . Sciatica of right side 04/13/2017  . Coronary artery disease involving native coronary artery of native heart without angina pectoris 10/03/2016  . Dyslipidemia 10/03/2016  . Coronary artery disease involving coronary bypass graft of native heart without angina pectoris 04/01/2016  . Essential hypertension 04/01/2016    RAMSEUR,CHRIS, PTA 12/28/2017, 5:12 PM  [facility] [address] [place], [place], [address] Phone: [phone]   Fax:  [phone]  Name: SARATH PRIVOTT MRN: [id number] Date of Birth: [birth year]

## 2017-12-31 ENCOUNTER — Encounter: Payer: Self-pay | Admitting: Family Medicine

## 2018-01-01 ENCOUNTER — Ambulatory Visit: Payer: 59 | Admitting: Physical Therapy

## 2018-01-01 DIAGNOSIS — G8929 Other chronic pain: Secondary | ICD-10-CM

## 2018-01-01 DIAGNOSIS — M545 Low back pain: Secondary | ICD-10-CM | POA: Diagnosis not present

## 2018-01-01 NOTE — Therapy (Signed)
[facility] [place], [place], [address] Phone: [phone]   Fax:  [phone]  Physical Therapy Treatment  Patient Details  Name: Ricky Chavez MRN: [id number] Date of Birth: Jul 23, 1955 Referring Provider: Shawnie Dapper MD   Encounter Date: 01/01/2018  PT End of Session - 01/01/18 1441    Visit Number  9    Number of Visits  12    Date for PT Re-Evaluation  01/08/18    PT Start Time  7616    PT Stop Time  1523    PT Time Calculation (min)  51 min    Activity Tolerance  Patient tolerated treatment well    Behavior During Therapy  [facility] for tasks assessed/performed       Past Medical History:  Diagnosis Date  . Allergic rhinitis   . BPH with obstruction/lower urinary tract symptoms   . Coronary artery disease    With preserved EF.  Needs DAPT lifetime due to high risk status.  . Elevated PSA onset @ 2009   Neg bx x 3, most recent 2009 ([facility] in [place]).  PSA stable at 3.98 12/01/14 and 4.61 on 07/25/15 per old records.  PSA 10 at Dr. Noland Fordyce ([facility]) 03/2016: flomax started and plan is to recheck PSA and f/u with urol in 6 mo.  Recheck PSA here 08/2016 was 10.1, then 9.2 on 08/30/16, 10.8 on 04/03/17 at urol f/u: MR prostate 04/2017->BPH. 11/2017 PSA 10.9.  Marland Kitchen GERD (gastroesophageal reflux disease)   . Hearing loss of both ears   . History of myocardial infarction 2011  . Hyperlipidemia   . Hypertension   . Insomnia   . Organic impotence   . 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  May need MRI L spine and/or emg/NCS  . Squamous cell carcinoma     Past Surgical History:  Procedure Laterality Date  . CARDIAC CATHETERIZATION  01/31/13   Stents to LAD patent; small diagonal stent with 90% ostial stenosis--good overall LV function with mild anterior and focal inferoapical hypokinesis, EF 60-65%.  Unsuccessful angioplasty of diagonal.  . CARDIOVASCULAR STRESS  TEST  05/26/15   stress echo neg for ischemia  . CARDIOVASCULAR STRESS TEST  10/04/2017   Myoc perf imaging: normal (EF 59%)  . COLONOSCOPY  2008; 06/14/16   2008 Normal.  2018 hyperplastic polyp +internal hemorrhoids.  Recall 2028.  Marland Kitchen HYDROCELE EXCISION / REPAIR  early 2000's  . INGUINAL HERNIA REPAIR Bilateral 1998  . PROSTATE BIOPSY     Multiple; all neg.  Prostate MRI 2018 -NO SIGN OF PROSTATE CA.  Marland Kitchen PTCA     Cath 2011---4 stents--LAD and diagonal.  2014 repeat cath for CP showed no changes.  . Scrotal u/s  01/2015   numerous extratesticular cystic lesions bilat, c/w numerous epididymal cysts vs loculated hydrocele--following expectantly.    There were no vitals filed for this visit.  Subjective Assessment - 01/01/18 1530    Subjective  Patient reported feeling much better. Traction has significantly improved pain and reports pain currently is 1/10.    Pertinent History  H/o low backpain.    Patient Stated Goals  Get out of pain.    Currently in Pain?  Yes    Pain Score  1     Pain Location  Back    Pain Orientation  Right;Lower    Pain Descriptors / Indicators  Aching    Pain Type  Chronic pain    Pain  Onset  More than a month ago                       [facility] Adult PT Treatment/Exercise - 01/01/18 0001      Exercises   Exercises  Lumbar      Lumbar Exercises: Aerobic   Nustep  L5 x 15  min with core activation      Lumbar Exercises: Standing   Other Standing Lumbar Exercises  lateral band walking x3 minutes      Modalities   Modalities  Traction      Traction   Type of Traction  Lumbar    Min (lbs)  5    Max (lbs)  95    Hold Time  99    Rest Time  5    Time  15                  PT Long Term Goals - 12/26/17 1307      PT LONG TERM GOAL #1   Title  Independent with an HEP.    Time  6    Period  Weeks    Status  On-going      PT LONG TERM GOAL #2   Title  Stand 30 minutes with pain not > 2/10.    Time  6    Period  Weeks     Status  On-going 10 min max 12/26/2017      PT LONG TERM GOAL #3   Title  Walk a community distance with pain not > 2/10.    Time  6    Period  Weeks    Status  On-going      PT LONG TERM GOAL #4   Title  Perform ADL's with pain not > 2-3/10.    Time  6    Period  Weeks    Status  On-going            Plan - 01/01/18 1524    Clinical Impression Statement  Patient was able to tolerate treatment well. Patient attempted mini squats at countertop but reported increased pain in R low back; exercise terminated. Patient was able to demonstrate good form with lateral stepping. Normal response to traction at end of session with reports of no pain.  Patient and PT reviewed HEP and added lateral band walking to HEP to improve hip strength. Patient reported understanding.    Clinical Presentation  Stable    Clinical Decision Making  Low    Rehab Potential  Excellent    PT Treatment/Interventions  ADLs/Self Care Home Management;Cryotherapy;Electrical Stimulation;Ultrasound;Moist Heat;Therapeutic activities;Therapeutic exercise;Patient/family education;Manual techniques;Dry needling;Traction    PT Next Visit Plan  STW/M to right SIJ; adductor squeeze and resisted right hip flexion ro correct leg length discrepancy; Combo e'stim/U/S; can try int traction at 80 BW (up to 50-55% per patient tolerance); advanced core exercises.    Consulted and Agree with Plan of Care  Patient       Patient will benefit from skilled therapeutic intervention in order to improve the following deficits and impairments:  Pain, Decreased range of motion, Decreased activity tolerance  Visit Diagnosis: Chronic right-sided low back pain without sciatica     Problem List Patient Active Problem List   Diagnosis Date Noted  . Chest tightness 09/25/2017  . Sciatica of right side 04/13/2017  . Coronary artery disease involving native coronary artery of native heart without angina pectoris 10/03/2016  . Dyslipidemia  10/03/2016  .  Coronary artery disease involving coronary bypass graft of native heart without angina pectoris 04/01/2016  . Essential hypertension 04/01/2016    Gabriela Eves, PT, DPT 01/01/2018, 3:32 PM  [facility] [address] [place], [place], [address] Phone: [phone]   Fax:  [phone]  Name: CORDARRO SPINNATO MRN: [id number] Date of Birth: Feb 07, 1956

## 2018-01-17 ENCOUNTER — Ambulatory Visit: Payer: 59 | Admitting: Physical Therapy

## 2018-03-12 ENCOUNTER — Ambulatory Visit: Payer: 59 | Admitting: Family Medicine

## 2018-03-12 ENCOUNTER — Encounter: Payer: Self-pay | Admitting: Family Medicine

## 2018-03-12 VITALS — BP 100/65 | HR 57 | Temp 98.3°F | Resp 16 | Ht 68.0 in | Wt 182.0 lb

## 2018-03-12 DIAGNOSIS — A0472 Enterocolitis due to Clostridium difficile, not specified as recurrent: Secondary | ICD-10-CM | POA: Diagnosis not present

## 2018-03-12 DIAGNOSIS — Z23 Encounter for immunization: Secondary | ICD-10-CM | POA: Diagnosis not present

## 2018-03-12 MED ORDER — METRONIDAZOLE 500 MG PO TABS: 500.0000 mg | ORAL_TABLET | Freq: Three times a day (TID) | ORAL | 0 refills | Status: AC

## 2018-03-12 NOTE — Progress Notes (Signed)
OFFICE VISIT  03/12/2018   CC:  Chief Complaint  Patient presents with  . Diarrhea    C. Diff?   HPI:    Patient is a 62 y.o. Caucasian male who presents for diarrhea.  He got 5d of abx (9/10-9/15, 2019)--cefdinir--for a derm procedure (cyst on nose I & D). About 02/20/18 he started getting lots of watery urgent BMs, with crampy abd pain.  At one point he was having 5 watery  BMs per night. Since then the sx's have waxed and waned, has been on probiotic the last week and the BMs have been less frequent and more formed (although still loose).  Some nights he still has BM in middle of night.  His abd cramping has been MUCH less lately.  No blood in stool.  No fevers.  Occ nausea but no vomiting.  BMs were the same no matter what food he ate. BMs not necessarily postprandial.  No sick contacts with same sx's. He does have chickens. No recent travel.  He has been working hard on hydrating with water and gatorade.   Past Medical History:  Diagnosis Date  . Allergic rhinitis   . BPH with obstruction/lower urinary tract symptoms   . Coronary artery disease    With preserved EF.  Needs DAPT lifetime due to high risk status.  . Elevated PSA onset @ 2009   Neg bx x 3, most recent 2009 ([facility] in [place]).  PSA stable at 3.98 12/01/14 and 4.61 on 07/25/15 per old records.  PSA 10 at Dr. Noland Fordyce ([facility]) 03/2016: flomax started and plan is to recheck PSA and f/u with urol in 6 mo.  Recheck PSA here 08/2016 was 10.1, then 9.2 on 08/30/16, 10.8 on 04/03/17 at urol f/u: MR prostate 04/2017->BPH. 11/2017 PSA 10.9.  Marland Kitchen GERD (gastroesophageal reflux disease)   . Hearing loss of both ears   . History of myocardial infarction 2011  . Hyperlipidemia   . Hypertension   . Insomnia   . Organic impotence   . 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  May need MRI L spine and/or emg/NCS  . Squamous cell carcinoma     Past Surgical History:   Procedure Laterality Date  . CARDIAC CATHETERIZATION  01/31/13   Stents to LAD patent; small diagonal stent with 90% ostial stenosis--good overall LV function with mild anterior and focal inferoapical hypokinesis, EF 60-65%.  Unsuccessful angioplasty of diagonal.  . CARDIOVASCULAR STRESS TEST  05/26/15   stress echo neg for ischemia  . CARDIOVASCULAR STRESS TEST  10/04/2017   Myoc perf imaging: normal (EF 59%)  . COLONOSCOPY  2008; 06/14/16   2008 Normal.  2018 hyperplastic polyp +internal hemorrhoids.  Recall 2028.  Marland Kitchen HYDROCELE EXCISION / REPAIR  early 2000's  . INGUINAL HERNIA REPAIR Bilateral 1998  . PROSTATE BIOPSY     Multiple; all neg.  Prostate MRI 2018 -NO SIGN OF PROSTATE CA.  Marland Kitchen PTCA     Cath 2011---4 stents--LAD and diagonal.  2014 repeat cath for CP showed no changes.  . Scrotal u/s  01/2015   numerous extratesticular cystic lesions bilat, c/w numerous epididymal cysts vs loculated hydrocele--following expectantly.    Outpatient Medications Prior to Visit  Medication Sig Dispense Refill  . aspirin EC 81 MG tablet Take 1 tablet by mouth daily.    Marland Kitchen atorvastatin (LIPITOR) 40 MG tablet Take 1 tablet (40 mg total) by mouth daily. 90 tablet 1  . BRILINTA 60 MG TABS  tablet TAKE 1 TABLET TWICE A DAY 180 tablet 3  . carvedilol (COREG) 6.25 MG tablet Take 1 tablet (6.25 mg total) by mouth 2 (two) times daily. 180 tablet 3  . finasteride (PROSCAR) 5 MG tablet Take 1 tablet by mouth daily.    Marland Kitchen lisinopril (PRINIVIL,ZESTRIL) 5 MG tablet TAKE 1 TABLET DAILY 90 tablet 1  . Melatonin (MELATONIN MAXIMUM STRENGTH) 5 MG TABS Take 1 tablet by mouth at bedtime.    . Multiple Vitamins-Minerals (MULTIVITAMIN ADULT PO) Take 1 capsule by mouth daily.    . nitroGLYCERIN (NITROLINGUAL) 0.4 MG/SPRAY spray Place 1 spray under the tongue every 5 (five) minutes x 3 doses as needed for chest pain. 12 g 1  . omeprazole (PRILOSEC) 20 MG capsule Take 1 capsule by mouth daily.    . tamsulosin (FLOMAX) 0.4 MG  CAPS capsule Take 0.4 mg by mouth daily.    . vardenafil (LEVITRA) 20 MG tablet Take 1 tablet by mouth daily as needed.     No facility-administered medications prior to visit.     No Known Allergies  ROS As per HPI  PE: Blood pressure 100/65, pulse (!) 57, temperature 98.3 F (36.8 C), temperature source Oral, resp. rate 16, height 5\' 8"  (1.727 m), weight 182 lb (82.6 kg), SpO2 97 %. Body mass index is 27.67 kg/m.  Gen: Alert, well appearing.  Patient is oriented to person, place, time, and situation. AFFECT: pleasant, lucid thought and speech. VQQ:VZDG: no injection, icteris, swelling, or exudate.  EOMI, PERRLA. Mouth: lips without lesion/swelling.  Oral mucosa pink and moist. Oropharynx without erythema, exudate, or swelling.  CV: RRR, occ ectopy, no m/r/g.   LUNGS: CTA bilat, nonlabored resps, good aeration in all lung fields. ABD: soft, Non distended, BS normal.  Mild TTP in LLQ w/out guarding or rebound.  No HSM, mass, or bruit.   LABS:    Chemistry      Component Value Date/Time   NA 141 10/06/2017 0833   K 4.6 10/06/2017 0833   CL 105 10/06/2017 0833   CO2 27 10/06/2017 0833   BUN 13 10/06/2017 0833   CREATININE 1.09 10/06/2017 0833      Component Value Date/Time   CALCIUM 9.6 10/06/2017 0833   ALKPHOS 72 09/01/2016 0925   AST 30 10/06/2017 0833   ALT 31 10/06/2017 0833   BILITOT 1.1 10/06/2017 0833     Lab Results  Component Value Date   WBC 6.2 10/06/2017   HGB 15.7 10/06/2017   HCT 44.2 10/06/2017   MCV 88.6 10/06/2017   PLT 185 10/06/2017    IMPRESSION AND PLAN:  Diarrhea: strong suspicion of C diff diarrhea/colitis. No sign of dehydration: he says his current bp is not that unusual for him, plus he has no orthostatic dizziness. He has pushed fluids well. Discussed options today: get stool sample first and then treat if +, OR empiric tx with flagyl. We agreed that empiric tx with flagyl was best.  Flagyl 500 mg tid x 14d rx'd.  Therapeutic  expectations and side effect profile of medication discussed today.  Patient's questions answered.  An After Visit Summary was printed and given to the patient.  FOLLOW UP: Return if symptoms worsen or fail to improve.  Signed:  Crissie Sickles, MD           03/12/2018

## 2018-04-07 ENCOUNTER — Other Ambulatory Visit: Payer: Self-pay | Admitting: Family Medicine

## 2018-04-07 ENCOUNTER — Other Ambulatory Visit: Payer: Self-pay | Admitting: Cardiology

## 2018-04-09 NOTE — Telephone Encounter (Signed)
Rx(s) sent to pharmacy electronically.  

## 2018-05-06 DIAGNOSIS — R002 Palpitations: Secondary | ICD-10-CM

## 2018-05-06 HISTORY — DX: Palpitations: R00.2

## 2018-05-11 LAB — PSA: PSA: 6.03

## 2018-05-17 ENCOUNTER — Encounter: Payer: Self-pay | Admitting: Family Medicine

## 2018-05-24 ENCOUNTER — Telehealth: Payer: Self-pay | Admitting: Cardiology

## 2018-05-24 NOTE — Telephone Encounter (Signed)
° °  Patient calling to report "heart skipping a beat",  Stating his BP and HR have been normal (unable to provide numbers) Patient requesting  Advice and asap appt. Please call

## 2018-05-24 NOTE — Telephone Encounter (Signed)
Returned call to patient he stated his heart has been skipping for the past 2 weeks off and on.No skipping at present.No chest pain.Appointment scheduled with Almyra Deforest PA 05/28/18 at 10:00 am.Advised to go to ED if needed.

## 2018-05-28 ENCOUNTER — Ambulatory Visit: Payer: 59 | Admitting: Physician Assistant

## 2018-05-28 ENCOUNTER — Encounter: Payer: Self-pay | Admitting: Physician Assistant

## 2018-05-28 VITALS — BP 108/62 | HR 54 | Ht 68.0 in | Wt 185.8 lb

## 2018-05-28 DIAGNOSIS — I2510 Atherosclerotic heart disease of native coronary artery without angina pectoris: Secondary | ICD-10-CM

## 2018-05-28 DIAGNOSIS — E785 Hyperlipidemia, unspecified: Secondary | ICD-10-CM | POA: Diagnosis not present

## 2018-05-28 DIAGNOSIS — R002 Palpitations: Secondary | ICD-10-CM | POA: Diagnosis not present

## 2018-05-28 DIAGNOSIS — I10 Essential (primary) hypertension: Secondary | ICD-10-CM

## 2018-05-28 NOTE — Patient Instructions (Addendum)
Medication Instructions:  The current medical regimen is effective;  continue present plan and medications.  If you need a refill on your cardiac medications before your next appointment, please call your pharmacy.   Follow-Up: At [facility], you and your health needs are our priority.  As part of our continuing mission to provide you with exceptional heart care, we have created designated Provider Care Teams.  These Care Teams include your primary Cardiologist (physician) and Advanced Practice Providers (APPs -  Physician Assistants and Nurse Practitioners) who all work together to provide you with the care you need, when you need it. You will need a follow up appointment in 6 weeks. You may see Dr.Hochrein or one of the following Advanced Practice Providers on your designated Care Team:   . Almyra Deforest PA  Any Other Special Instructions Will Be Listed Below (If Applicable). Hold monitor order for now, stop caffeine and alcohol recheck in 6 weeks.

## 2018-05-28 NOTE — Progress Notes (Signed)
Cardiology Office Note    Date:  05/30/2018   ID:  Ricky Chavez, DOB 12-10-1955, MRN [id number]  PCP:  Ricky Sou, MD  Cardiologist: Dr. Percival Chavez  Chief Complaint  Patient presents with  . Follow-up    seen for Dr. Percival Chavez.     History of Present Illness:  Ricky Chavez is a 62 y.o. male with PMH of HTN, HLD, sciatica, squamous cell carcinoma, and CAD.  He had stent to LAD in 2016 at [facility].  EF was 60 to 65% of the time.  He had unsuccessful angioplasty of the diagonal.  Stress echo in 05/2015 was negative for evidence of ischemia.  Patient was last seen by Dr. Percival Chavez in April 2019, he had some atypical chest discomfort at that time.  Myoview performed on 10/04/2017 showed EF 59%, overall low risk study.  Patient presents today for evaluation of occasional skipped heartbeat.  He started noticing the symptoms roughly 3 to 4 weeks ago.  He says he would notice a skipped heartbeat every 3-4 beats and this would last several hours at a time.  His wife is cardiac unit nurse advised him to hold Flomax.  He has tried this for the past 4 days.  He usually drinks about 2 to 3 cup of coffee in the morning and has an additional bottle of mountain dew during lunch.  He also drinks several glasses of wine per night.  I recommended for him to cut back on caffeine intake and also alcohol intake.  His last TSH was in May 2019 which was normal.  He is symptom is more consistent with symptomatic PVCs or PAC, I recommended conservative management with diet control for now.  His heart rate is in the low 50s on the current dose of carvedilol, I will continue on the current therapy.  I will bring him back in 6 weeks for reassessment, if his symptoms become more frequent, we can consider either a 24 or 48-hour Holter monitor.   Past Medical History:  Diagnosis Date  . Allergic rhinitis   . BPH with obstruction/lower urinary tract symptoms   . Coronary artery disease    With preserved EF.   Needs DAPT lifetime due to high risk status.  . Elevated PSA onset @ 2009   Neg bx x 3, most recent 2009 ([facility] in [place]).  PSA stable at 3.98 12/01/14 and 4.61 on 07/25/15 per old records.  PSA 10 at Dr. Noland Chavez ([facility]) 03/2016: flomax started and plan is to recheck PSA and f/u with urol in 6 mo.  Recheck PSA here 08/2016 was 10.1, then 9.2 on 08/30/16, 10.8 on 04/03/17 at urol f/u: MR prostate 04/2017->BPH. 11/2017 PSA 10.9.  Marland Kitchen GERD (gastroesophageal reflux disease)   . Hearing loss of both ears   . History of myocardial infarction 2011  . Hyperlipidemia   . Hypertension   . Insomnia   . Organic impotence   . 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  May need MRI L spine and/or emg/NCS  . Squamous cell carcinoma     Past Surgical History:  Procedure Laterality Date  . CARDIAC CATHETERIZATION  01/31/13   Stents to LAD patent; small diagonal stent with 90% ostial stenosis--good overall LV function with mild anterior and focal inferoapical hypokinesis, EF 60-65%.  Unsuccessful angioplasty of diagonal.  . CARDIOVASCULAR STRESS TEST  05/26/15   stress echo neg for ischemia  . CARDIOVASCULAR STRESS TEST  10/04/2017  Myoc perf imaging: normal (EF 59%)  . COLONOSCOPY  2008; 06/14/16   2008 Normal.  2018 hyperplastic polyp +internal hemorrhoids.  Recall 2028.  Marland Kitchen HYDROCELE EXCISION / REPAIR  early 2000's  . INGUINAL HERNIA REPAIR Bilateral 1998  . PROSTATE BIOPSY     Multiple; all neg.  Prostate MRI 2018 -NO SIGN OF PROSTATE CA.  Marland Kitchen PTCA     Cath 2011---4 stents--LAD and diagonal.  2014 repeat cath for CP showed no changes.  . Scrotal u/s  01/2015   numerous extratesticular cystic lesions bilat, c/w numerous epididymal cysts vs loculated hydrocele--following expectantly.    Current Medications: Outpatient Medications Prior to Visit  Medication Sig Dispense Refill  . aspirin EC 81 MG tablet Take 1 tablet by mouth daily.    Marland Kitchen  atorvastatin (LIPITOR) 40 MG tablet TAKE 1 TABLET DAILY 90 tablet 1  . BRILINTA 60 MG TABS tablet TAKE 1 TABLET TWICE A DAY 180 tablet 3  . carvedilol (COREG) 6.25 MG tablet Take 1 tablet (6.25 mg total) by mouth 2 (two) times daily. 180 tablet 3  . DUTASTERIDE PO Take by mouth.    Marland Kitchen lisinopril (PRINIVIL,ZESTRIL) 5 MG tablet TAKE 1 TABLET DAILY 90 tablet 2  . Melatonin (MELATONIN MAXIMUM STRENGTH) 5 MG TABS Take 1 tablet by mouth at bedtime.    . Multiple Vitamins-Minerals (MULTIVITAMIN ADULT PO) Take 1 capsule by mouth daily.    . nitroGLYCERIN (NITROLINGUAL) 0.4 MG/SPRAY spray Place 1 spray under the tongue every 5 (five) minutes x 3 doses as needed for chest pain. 12 g 1  . omeprazole (PRILOSEC) 20 MG capsule Take 1 capsule by mouth daily.    . tamsulosin (FLOMAX) 0.4 MG CAPS capsule Take 0.4 mg by mouth daily.    . vardenafil (LEVITRA) 20 MG tablet Take 1 tablet by mouth daily as needed.    . finasteride (PROSCAR) 5 MG tablet Take 1 tablet by mouth daily.     No facility-administered medications prior to visit.      Allergies:   Patient has no known allergies.   Social History   Socioeconomic History  . Marital status: Married    Spouse name: Not on file  . Number of children: 2  . Years of education: Not on file  . Highest education level: Not on file  Occupational History  . Occupation: retired  Scientific laboratory technician  . Financial resource strain: Not on file  . Food insecurity:    Worry: Not on file    Inability: Not on file  . Transportation needs:    Medical: Not on file    Non-medical: Not on file  Tobacco Use  . Smoking status: Former Smoker    Packs/day: 0.25    Years: 2.00    Pack years: 0.50    Types: Cigarettes    Last attempt to quit: 06/06/1978    Years since quitting: 40.0  . Smokeless tobacco: Never Used  Substance and Sexual Activity  . Alcohol use: Yes    Alcohol/week: 14.0 standard drinks    Types: 14 Glasses of wine per week  . Drug use: No  . Sexual  activity: Not on file  Lifestyle  . Physical activity:    Days per week: Not on file    Minutes per session: Not on file  . Stress: Not on file  Relationships  . Social connections:    Talks on phone: Not on file    Gets together: Not on file    Attends religious  service: Not on file    Active member of club or organization: Not on file    Attends meetings of clubs or organizations: Not on file    Relationship status: Not on file  Other Topics Concern  . Not on file  Social History Narrative   Married, 2 grown children.   Relocated to [place] from [facility] 2016.   Educ: Masters degree   Occup: Retired Government social research officer with [employer]   No tob.   Alc: 1 glass wine/night.     Family History:  The patient's family history includes Colon polyps in his father; Heart attack (age of onset: 79) in his brother; Heart disease (age of onset: 49) in his father and mother; Hyperlipidemia in his father and mother; Hypertension in his father and mother; Hypothyroidism in his sister; Peripheral vascular disease in his brother; Prostate cancer in his paternal uncle.   ROS:   Please see the history of present illness.    ROS All other systems reviewed and are negative.   PHYSICAL EXAM:   VS:  BP 108/62   Pulse (!) 54   Ht 5\' 8"  (1.727 m)   Wt 185 lb 12.8 oz (84.3 kg)   BMI 28.25 kg/m    GEN: Well nourished, well developed, in no acute distress  HEENT: normal  Neck: no JVD, carotid bruits, or masses Cardiac: RRR; no murmurs, rubs, or gallops,no edema  Respiratory:  clear to auscultation bilaterally, normal work of breathing GI: soft, nontender, nondistended, + BS MS: no deformity or atrophy  Skin: warm and dry, no rash Neuro:  Alert and Oriented x 3, Strength and sensation are intact Psych: euthymic mood, full affect  Wt Readings from Last 3 Encounters:  05/28/18 185 lb 12.8 oz (84.3 kg)  03/12/18 182 lb (82.6 kg)  11/21/17 185 lb (83.9 kg)      Studies/Labs Reviewed:   EKG:   EKG is ordered today.  The ekg ordered today demonstrates sinus bradycardia, HR 54  Recent Labs: 10/06/2017: ALT 31; BUN 13; Creat 1.09; Hemoglobin 15.7; Platelets 185; Potassium 4.6; Sodium 141; TSH 2.07   Lipid Panel    Component Value Date/Time   CHOL 119 10/06/2017 0833   TRIG 114 10/06/2017 0833   HDL 42 10/06/2017 0833   CHOLHDL 2.8 10/06/2017 0833   VLDL 24 09/01/2016 0925   LDLCALC 57 10/06/2017 0833    Additional studies/ records that were reviewed today include:   Myoview 10/04/2017 Study Highlights    The left ventricular ejection fraction is normal (55-65%).  Nuclear stress EF: 59%.  There was no ST segment deviation noted during stress.  The study is normal.  This is a low risk study.       ASSESSMENT:    1. Palpitations   2. Essential hypertension   3. Hyperlipidemia LDL goal <70   4. Coronary artery disease involving native coronary artery of native heart without angina pectoris      PLAN:  In order of problems listed above:  1. Palpitation: Symptom more consistent with symptomatic PVC versus PACs.  I recommended for him to cut back on caffeine and alcohol.  If symptoms persist on follow-up, then may consider 24 to 48-hour Holter monitor.  I am unable to uptitrate his beta-blocker as he is already bradycardic.  Last TSH was earlier this year which was normal  2. CAD: Denies any recent anginal symptom.  Continue aspirin and statin  3. Hypertension: Blood pressure stable on current therapy  4. Hyperlipidemia: Continue  Lipitor 40 mg daily.    Medication Adjustments/Labs and Tests Ordered: Current medicines are reviewed at length with the patient today.  Concerns regarding medicines are outlined above.  Medication changes, Labs and Tests ordered today are listed in the Patient Instructions below. Patient Instructions  Medication Instructions:  The current medical regimen is effective;  continue present plan and medications.  If you need a refill  on your cardiac medications before your next appointment, please call your pharmacy.   Follow-Up: At [facility], you and your health needs are our priority.  As part of our continuing mission to provide you with exceptional heart care, we have created designated Provider Care Teams.  These Care Teams include your primary Cardiologist (physician) and Advanced Practice Providers (APPs -  Physician Assistants and Nurse Practitioners) who all work together to provide you with the care you need, when you need it. You will need a follow up appointment in 6 weeks. You may see Dr.Hochrein or one of the following Advanced Practice Providers on your designated Care Team:   . Almyra Deforest PA  Any Other Special Instructions Will Be Listed Below (If Applicable). Hold monitor order for now, stop caffeine and alcohol recheck in 6 weeks.       Hilbert Corrigan, [place]  05/30/2018 11:45 PM    [address], [place], [place]  [address] Phone: [phone]; Fax: [phone]

## 2018-05-30 ENCOUNTER — Encounter: Payer: Self-pay | Admitting: Physician Assistant

## 2018-06-03 ENCOUNTER — Encounter: Payer: Self-pay | Admitting: Family Medicine

## 2018-06-22 ENCOUNTER — Encounter: Payer: Self-pay | Admitting: Family Medicine

## 2018-07-08 NOTE — Progress Notes (Signed)
Cardiology Office Note   Date:  07/10/2018   ID:  Ricky Chavez, DOB Jul 12, 1955, MRN [id number]  PCP:  [name], MD  Cardiologist:   Minus Breeding, MD  Referring:  [name], MD  Chief Complaint  Patient presents with  . Coronary Artery Disease      History of Present Illness: Ricky Chavez is a 63 y.o. male who presents for follow up of CAD.  The patient was treated at [facility] for CAD in late 2016.  He had stents to LAD patent; small diagonal stent with 90% ostial stenosis--good overall LV function with mild anterior and focal inferoapical hypokinesis, EF 60-65%. He had unsuccessful angioplasty of the diagonal.  He had a stress echo in 2016 December that was negative for any evidence of ischemia. The patient had a complicated course with his initial angioplasty.  At the last visit he had chest pain.  He had a low risk perfusion study.  He presented in Dec with palpitations.  He discovered that this was probably related to his Flomax which he has started taking every other day and his palpitations have improved.  He is feeling well.  He recently got back from [place] where he did a lot of hiking on a mission trip.  He denies any cardiovascular symptoms.  The patient denies any new symptoms such as chest discomfort, neck or arm discomfort. There has been no new shortness of breath, PND or orthopnea. There has been no  presyncope or syncope.    Past Medical History:  Diagnosis Date  . Allergic rhinitis   . BPH with obstruction/lower urinary tract symptoms   . Coronary artery disease    With preserved EF.  Needs DAPT lifetime due to high risk status.  . Elevated PSA onset @ 2009   Neg bx x 3, most recent 2009 ([facility] in [place]).  PSA stable at 3.98 12/01/14 and 4.61 on 07/25/15 per old records.  PSA 10 at Dr. Noland Fordyce ([facility]) 03/2016: flomax started and plan is to recheck PSA and f/u with urol in 6 mo.  Recheck PSA here 08/2016 was 10.1, then 9.2  on 08/30/16, 10.8 on 04/03/17 at urol f/u: MR prostate 04/2017->BPH. 11/2017 PSA 10.9.  05/2018->6.03  . GERD (gastroesophageal reflux disease)   . Hearing loss of both ears   . History of myocardial infarction 2011  . Hyperlipidemia   . Hypertension   . Insomnia   . Organic impotence   . Palpitations 05/2018   Cards suspects symptomatic PACs or PVCs--conservative mgmt, but monitor if worse/persistent.  Needs to minimize alc and cafff  . 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  May need MRI L spine and/or emg/NCS  . Squamous cell carcinoma     Past Surgical History:  Procedure Laterality Date  . CARDIAC CATHETERIZATION  01/31/13   Stents to LAD patent; small diagonal stent with 90% ostial stenosis--good overall LV function with mild anterior and focal inferoapical hypokinesis, EF 60-65%.  Unsuccessful angioplasty of diagonal.  . CARDIOVASCULAR STRESS TEST  05/26/15   stress echo neg for ischemia  . CARDIOVASCULAR STRESS TEST  10/04/2017   Myoc perf imaging: normal (EF 59%)  . COLONOSCOPY  2008; 06/14/16   2008 Normal.  2018 hyperplastic polyp +internal hemorrhoids.  Recall 2028.  Marland Kitchen HYDROCELE EXCISION / REPAIR  early 2000's  . INGUINAL HERNIA REPAIR Bilateral 1998  . PROSTATE BIOPSY     Multiple; all neg.  Prostate  MRI 2018 -NO SIGN OF PROSTATE CA.  Marland Kitchen PTCA     Cath 2011---4 stents--LAD and diagonal.  2014 repeat cath for CP showed no changes.  . Scrotal u/s  01/2015   numerous extratesticular cystic lesions bilat, c/w numerous epididymal cysts vs loculated hydrocele--following expectantly.     Current Outpatient Medications  Medication Sig Dispense Refill  . aspirin EC 81 MG tablet Take 1 tablet by mouth daily.    Marland Kitchen atorvastatin (LIPITOR) 40 MG tablet TAKE 1 TABLET DAILY 90 tablet 1  . BRILINTA 60 MG TABS tablet TAKE 1 TABLET TWICE A DAY 180 tablet 3  . carvedilol (COREG) 6.25 MG tablet Take 1 tablet (6.25 mg total) by mouth 2 (two) times  daily. 180 tablet 3  . DUTASTERIDE PO Take by mouth.    Marland Kitchen lisinopril (PRINIVIL,ZESTRIL) 5 MG tablet TAKE 1 TABLET DAILY 90 tablet 2  . Melatonin (MELATONIN MAXIMUM STRENGTH) 5 MG TABS Take 1 tablet by mouth at bedtime.    . Multiple Vitamins-Minerals (MULTIVITAMIN ADULT PO) Take 1 capsule by mouth daily.    . nitroGLYCERIN (NITROLINGUAL) 0.4 MG/SPRAY spray Place 1 spray under the tongue every 5 (five) minutes x 3 doses as needed for chest pain. 12 g 1  . omeprazole (PRILOSEC) 20 MG capsule Take 1 capsule by mouth daily.    . tamsulosin (FLOMAX) 0.4 MG CAPS capsule Take 0.4 mg by mouth daily.    . vardenafil (LEVITRA) 20 MG tablet Take 1 tablet by mouth daily as needed.     No current facility-administered medications for this visit.     Allergies:   Patient has no known allergies.    ROS:  Please see the history of present illness.   Otherwise, review of systems are positive for none.   All other systems are reviewed and negative.    PHYSICAL EXAM: VS:  BP 118/66 (BP Location: Left Arm, Patient Position: Sitting, Cuff Size: Normal)   Pulse 60   Ht 5\' 8"  (1.727 m)   Wt 185 lb (83.9 kg)   BMI 28.13 kg/m  , BMI Body mass index is 28.13 kg/m.  GENERAL:  Well appearing NECK:  No jugular venous distention, waveform within normal limits, carotid upstroke brisk and symmetric, no bruits, no thyromegaly LUNGS:  Clear to auscultation bilaterally CHEST:  Unremarkable HEART:  PMI not displaced or sustained,S1 and S2 within normal limits, no S3, no S4, no clicks, no rubs, no murmurs ABD:  Flat, positive bowel sounds normal in frequency in pitch, no bruits, no rebound, no guarding, no midline pulsatile mass, no hepatomegaly, no splenomegaly EXT:  2 plus pulses throughout, no edema, no cyanosis no clubbing   EKG:  EKG is ordered today. Sinus bradycardia, rate 52, axis within normal limits, intervals within normal limits, nonspecific inferior T wave flattening.   Recent Labs: 10/06/2017: ALT  31; BUN 13; Creat 1.09; Hemoglobin 15.7; Platelets 185; Potassium 4.6; Sodium 141; TSH 2.07    Lipid Panel    Component Value Date/Time   CHOL 119 10/06/2017 0833   TRIG 114 10/06/2017 0833   HDL 42 10/06/2017 0833   CHOLHDL 2.8 10/06/2017 0833   VLDL 24 09/01/2016 0925   LDLCALC 57 10/06/2017 0833      Wt Readings from Last 3 Encounters:  07/10/18 185 lb (83.9 kg)  05/28/18 185 lb 12.8 oz (84.3 kg)  03/12/18 182 lb (82.6 kg)      Other studies Reviewed: Additional studies/ records that were reviewed today include: Labs Review of  the above records demonstrates:   See elsewhere   ASSESSMENT AND PLAN:   CAD:     The patient has no new sypmtoms.  No further cardiovascular testing is indicated.  We will continue with aggressive risk reduction and meds as listed.   Of note his previous intervention note suggested that he have lifelong DAPT.   DYSLIPIDEMIA:   LDL was 57 and HDL 42 most recently and he is due to have this followed by his primary provider.  No change in therapy.  HTN:  The blood pressure is at target.  No change in therapy.   PALPITATIONS: These are not particularly problematic.  No change in therapy.  Current medicines are reviewed at length with the patient today.  The patient does not have concerns regarding medicines.  The following changes have been made:  None  Labs/ tests ordered today include: None  No orders of the defined types were placed in this encounter.    Disposition:   FU with me in 12 months.     Signed, Minus Breeding, MD  07/10/2018 3:17 PM    [facility]

## 2018-07-10 ENCOUNTER — Ambulatory Visit: Payer: 59 | Admitting: Cardiology

## 2018-07-10 ENCOUNTER — Encounter: Payer: Self-pay | Admitting: Cardiology

## 2018-07-10 VITALS — BP 118/66 | HR 60 | Ht 68.0 in | Wt 185.0 lb

## 2018-07-10 DIAGNOSIS — E785 Hyperlipidemia, unspecified: Secondary | ICD-10-CM

## 2018-07-10 DIAGNOSIS — I10 Essential (primary) hypertension: Secondary | ICD-10-CM

## 2018-07-10 DIAGNOSIS — R002 Palpitations: Secondary | ICD-10-CM | POA: Diagnosis not present

## 2018-07-10 DIAGNOSIS — I2510 Atherosclerotic heart disease of native coronary artery without angina pectoris: Secondary | ICD-10-CM | POA: Diagnosis not present

## 2018-07-10 NOTE — Patient Instructions (Signed)
Medication Instructions:  Continue current medications  If you need a refill on your cardiac medications before your next appointment, please call your pharmacy.  Labwork: None Ordered   Take the provided lab slips with you to the lab for your blood draw.   When you have your labs (blood work) drawn today and your tests are completely normal, you will receive your results only by MyChart Message (if you have MyChart) -OR-  A paper copy in the mail.  If you have any lab test that is abnormal or we need to change your treatment, we will call you to review these results.  Testing/Procedures: None Ordered   Follow-Up: You will need a follow up appointment in 1 Year.  Please call our office 2 months in advance to schedule this appointment.  You may see Dr Hochrein or one of the following Advanced Practice Providers on your designated Care Team:   Rhonda Barrett, PA-C . Kathryn Lawrence, DNP, ANP    At [facility], you and your health needs are our priority.  As part of our continuing mission to provide you with exceptional heart care, we have created designated Provider Care Teams.  These Care Teams include your primary Cardiologist (physician) and Advanced Practice Providers (APPs -  Physician Assistants and Nurse Practitioners) who all work together to provide you with the care you need, when you need it.   Thank you for choosing [facility] at [facility]!!     

## 2018-08-13 ENCOUNTER — Other Ambulatory Visit: Payer: Self-pay | Admitting: Cardiology

## 2018-08-13 ENCOUNTER — Other Ambulatory Visit: Payer: Self-pay | Admitting: *Deleted

## 2018-08-13 MED ORDER — CARVEDILOL 6.25 MG PO TABS: 6.2500 mg | ORAL_TABLET | Freq: Two times a day (BID) | ORAL | 2 refills | Status: DC

## 2018-08-27 ENCOUNTER — Other Ambulatory Visit: Payer: Self-pay | Admitting: Cardiology

## 2018-08-27 ENCOUNTER — Encounter: Payer: Self-pay | Admitting: Family Medicine

## 2018-09-24 ENCOUNTER — Telehealth: Payer: Self-pay

## 2018-09-24 NOTE — Telephone Encounter (Signed)
Virtual Visit Pre-Appointment Phone Call  Steps For Call:  1. Confirm consent - "In the setting of the current Covid19 crisis, you are scheduled for a (phone or video) visit with your provider on (09/28/18) at (9:30AM).  Just as we do with many in-office visits, in order for you to participate in this visit, we must obtain consent.  If you'd like, I can send this to your mychart (if signed up) or email for you to review.  Otherwise, I can obtain your verbal consent now.  All virtual visits are billed to your insurance company just like a normal visit would be.  By agreeing to a virtual visit, we'd like you to understand that the technology does not allow for your provider to perform an examination, and thus may limit your provider's ability to fully assess your condition. If your provider identifies any concerns that need to be evaluated in person, we will make arrangements to do so.  Finally, though the technology is pretty good, we cannot assure that it will always work on either your or our end, and in the setting of a video visit, we may have to convert it to a phone-only visit.  In either situation, we cannot ensure that we have a secure connection.  Are you willing to proceed?" STAFF: Did the patient verbally acknowledge consent to telehealth visit? Document YES/NO here: YES  2. Confirm the BEST phone number to call the day of the visit by including in appointment notes  3. Give patient instructions for WebEx/MyChart download to smartphone as below or Doximity/Doxy.me if video visit (depending on what platform provider is using)  4. Advise patient to be prepared with their blood pressure, heart rate, weight, any heart rhythm information, their current medicines, and a piece of paper and pen handy for any instructions they may receive the day of their visit  5. Inform patient they will receive a phone call 15 minutes prior to their appointment time (may be from unknown caller ID) so they should  be prepared to answer  6. Confirm that appointment type is correct in Epic appointment notes (VIDEO vs PHONE)     TELEPHONE CALL NOTE  Ricky Chavez has been deemed a candidate for a follow-up tele-health visit to limit community exposure during the Covid-19 pandemic. I spoke with the patient via phone to ensure availability of phone/video source, confirm preferred email & phone number, and discuss instructions and expectations.  I reminded Ricky Chavez to be prepared with any vital sign and/or heart rhythm information that could potentially be obtained via home monitoring, at the time of his visit. I reminded Ricky Chavez to expect a phone call at the time of his visit if his visit.  [name], CMA 09/24/2018 2:05 PM   INSTRUCTIONS FOR DOWNLOADING THE [place] APP TO SMARTPHONE  - If Apple, ask patient to go to [employer] and type in WebEx in the search bar. [employer], the blue/green circle. If Android, go to [employer] and type in [employer] in the search bar. The app is free but as with any other app downloads, their phone may require them to verify saved payment information or Apple/Android password.  - The patient does NOT have to create an account. - On the day of the visit, the assist will walk the patient through joining the meeting with the meeting number/password.  INSTRUCTIONS FOR DOWNLOADING THE MYCHART APP TO SMARTPHONE  - The patient must first  make sure to have activated MyChart and know their login information - If Apple, go to [employer] and type in MyChart in the search bar and download the app. If Android, ask patient to go to [employer] and type in [place] in the search bar and download the app. The app is free but as with any other app downloads, their phone may require them to verify saved payment information or Apple/Android password.  - The patient will need to then log into the app with their MyChart username and password, and  select [facility] as their healthcare provider to link the account. When it is time for your visit, go to the MyChart app, find appointments, and click Begin Video Visit. Be sure to Select Allow for your device to access the Microphone and Camera for your visit. You will then be connected, and your provider will be with you shortly.  **If they have any issues connecting, or need assistance please contact MyChart service desk [phone])**  **If using a computer, in order to ensure the best quality for their visit they will need to use either of the following Internet Browsers: [employer], or Google Chrome**  IF USING DOXIMITY or DOXY.ME - The patient will receive a link just prior to their visit, either by text or email (to be determined day of appointment depending on if it's doxy.me or Doximity).     FULL LENGTH CONSENT FOR TELE-HEALTH VISIT   I hereby voluntarily request, consent and authorize [facility] and its employed or contracted physicians, physician assistants, nurse practitioners or other licensed health care professionals (the Practitioner), to provide me with telemedicine health care services (the "Services") as deemed necessary by the treating Practitioner. I acknowledge and consent to receive the Services by the Practitioner via telemedicine. I understand that the telemedicine visit will involve communicating with the Practitioner through live audiovisual communication technology and the disclosure of certain medical information by electronic transmission. I acknowledge that I have been given the opportunity to request an in-person assessment or other available alternative prior to the telemedicine visit and am voluntarily participating in the telemedicine visit.  I understand that I have the right to withhold or withdraw my consent to the use of telemedicine in the course of my care at any time, without affecting my right to future care or treatment, and that  the Practitioner or I may terminate the telemedicine visit at any time. I understand that I have the right to inspect all information obtained and/or recorded in the course of the telemedicine visit and may receive copies of available information for a reasonable fee.  I understand that some of the potential risks of receiving the Services via telemedicine include:  Marland Kitchen Delay or interruption in medical evaluation due to technological equipment failure or disruption; . Information transmitted may not be sufficient (e.g. poor resolution of images) to allow for appropriate medical decision making by the Practitioner; and/or  . In rare instances, security protocols could fail, causing a breach of personal health information.  Furthermore, I acknowledge that it is my responsibility to provide information about my medical history, conditions and care that is complete and accurate to the best of my ability. I acknowledge that Practitioner's advice, recommendations, and/or decision may be based on factors not within their control, such as incomplete or inaccurate data provided by me or distortions of diagnostic images or specimens that may result from electronic transmissions. I understand that the practice of medicine is not an  exact science and that Practitioner makes no warranties or guarantees regarding treatment outcomes. I acknowledge that I will receive a copy of this consent concurrently upon execution via email to the email address I last provided but may also request a printed copy by calling the office of [facility].    I understand that my insurance will be billed for this visit.   I have read or had this consent read to me. . I understand the contents of this consent, which adequately explains the benefits and risks of the Services being provided via telemedicine.  . I have been provided ample opportunity to ask questions regarding this consent and the Services and have had my questions answered to  my satisfaction. . I give my informed consent for the services to be provided through the use of telemedicine in my medical care  By participating in this telemedicine visit I agree to the above.

## 2018-09-27 ENCOUNTER — Telehealth: Payer: Self-pay | Admitting: Cardiology

## 2018-09-27 NOTE — Progress Notes (Signed)
Virtual Visit via Video Note   This visit type was conducted due to national recommendations for restrictions regarding the COVID-19 Pandemic (e.g. social distancing) in an effort to limit this patient's exposure and mitigate transmission in our community.  Due to his co-morbid illnesses, this patient is at least at moderate risk for complications without adequate follow up.  This format is felt to be most appropriate for this patient at this time.  All issues noted in this document were discussed and addressed.  A limited physical exam was performed with this format.  Please refer to the patient's chart for his consent to telehealth for [facility].   Evaluation Performed:  Follow-up visit  Date:  09/28/2018   ID:  Ricky Chavez, Ricky Chavez 1956/01/30, MRN [id number]  Patient Location: Home Provider Location: Home  PCP:  Ricky Sou, MD  Cardiologist:  Minus Breeding, MD  Electrophysiologist:  None   Chief Complaint:  Palpitations  History of Present Illness:    Ricky Chavez is a 63 y.o. male who presents for follow up of CAD.  The patient was treated at [facility] for CAD in late 2016.  He had stents to LAD patent; small diagonal stent with 90% ostial stenosis--good overall LV function with mild anterior and focal inferoapical hypokinesis, EF 60-65%. He had unsuccessful angioplasty of the diagonal.  He had a stress echo in 2016 December that was negative for any evidence of ischemia. The patient had a complicated course with his initial angioplasty.  Last year he had chest pain.  He had a low risk perfusion study.  He presented in Dec of last year with palpitations.  He discovered that this was probably related to his Flomax which he has started taking every other day and his palpitations have improved.    Since I last saw him he has done well.  The patient denies any new symptoms such as chest discomfort, neck or arm discomfort. There has been no new shortness of breath, PND or  orthopnea. There have been no reported palpitations, presyncope or syncope.  He has been active in the yard and has no complaints.   The patient does not have symptoms concerning for COVID-19 infection (fever, chills, cough, or new shortness of breath).    Past Medical History:  Diagnosis Date  . Allergic rhinitis   . BPH with obstruction/lower urinary tract symptoms   . Coronary artery disease    With preserved EF.  Needs DAPT lifetime due to high risk status.  . Elevated PSA onset @ 2009   Neg bx x 3, most recent 2009 ([place] in [place]).  PSA stable at 3.98 12/01/14 and 4.61 on 07/25/15 per old records.  PSA 10 at Dr. Noland Fordyce ([facility]) 03/2016: flomax started and plan is to recheck PSA and f/u with urol in 6 mo.  Recheck PSA here 08/2016 was 10.1, then 9.2 on 08/30/16, 10.8 on 04/03/17 at urol f/u: MR prostate 04/2017->BPH. 11/2017 PSA 10.9.  05/2018->6.03  . GERD (gastroesophageal reflux disease)   . Hearing loss of both ears   . History of myocardial infarction 2011  . Hyperlipidemia   . Hypertension   . Insomnia   . Organic impotence   . Palpitations 05/2018   Cards suspects symptomatic PACs or PVCs--conservative mgmt, but monitor if worse/persistent.  Needs to minimize alc and cafff  . 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  May need MRI L spine and/or emg/NCS  . Squamous cell  carcinoma    Past Surgical History:  Procedure Laterality Date  . CARDIAC CATHETERIZATION  01/31/13   Stents to LAD patent; small diagonal stent with 90% ostial stenosis--good overall LV function with mild anterior and focal inferoapical hypokinesis, EF 60-65%.  Unsuccessful angioplasty of diagonal.  . CARDIOVASCULAR STRESS TEST  05/26/15   stress echo neg for ischemia  . CARDIOVASCULAR STRESS TEST  10/04/2017   Myoc perf imaging: normal (EF 59%)  . COLONOSCOPY  2008; 06/14/16   2008 Normal.  2018 hyperplastic polyp +internal hemorrhoids.  Recall  2028.  Marland Kitchen HYDROCELE EXCISION / REPAIR  early 2000's  . INGUINAL HERNIA REPAIR Bilateral 1998  . PROSTATE BIOPSY     Multiple; all neg.  Prostate MRI 2018 -NO SIGN OF PROSTATE CA.  Marland Kitchen PTCA     Cath 2011---4 stents--LAD and diagonal.  2014 repeat cath for CP showed no changes.  . Scrotal u/s  01/2015   numerous extratesticular cystic lesions bilat, c/w numerous epididymal cysts vs loculated hydrocele--following expectantly.     Current Meds  Medication Sig  . aspirin EC 81 MG tablet Take 1 tablet by mouth daily.  Marland Kitchen atorvastatin (LIPITOR) 40 MG tablet TAKE 1 TABLET DAILY  . BRILINTA 60 MG TABS tablet TAKE 1 TABLET TWICE A DAY  . carvedilol (COREG) 6.25 MG tablet Take 1 tablet (6.25 mg total) by mouth 2 (two) times daily.  . DUTASTERIDE PO Take by mouth.  Marland Kitchen lisinopril (PRINIVIL,ZESTRIL) 5 MG tablet TAKE 1 TABLET DAILY  . Melatonin (MELATONIN MAXIMUM STRENGTH) 5 MG TABS Take 1 tablet by mouth at bedtime.  . Multiple Vitamins-Minerals (MULTIVITAMIN ADULT PO) Take 1 capsule by mouth daily.  . nitroGLYCERIN (NITROLINGUAL) 0.4 MG/SPRAY spray Place 1 spray under the tongue every 5 (five) minutes x 3 doses as needed for chest pain.  Marland Kitchen omeprazole (PRILOSEC) 20 MG capsule Take 1 capsule by mouth daily.  . tamsulosin (FLOMAX) 0.4 MG CAPS capsule Take 0.4 mg by mouth daily.     Allergies:   Patient has no known allergies.   Social History   Tobacco Use  . Smoking status: Former Smoker    Packs/day: 0.25    Years: 2.00    Pack years: 0.50    Types: Cigarettes    Last attempt to quit: 06/06/1978    Years since quitting: 40.3  . Smokeless tobacco: Never Used  Substance Use Topics  . Alcohol use: Yes    Alcohol/week: 14.0 standard drinks    Types: 14 Glasses of wine per week  . Drug use: No     Family Hx: The patient's family history includes Colon polyps in his father; Heart attack (age of onset: 79) in his brother; Heart disease (age of onset: 31) in his father and mother; Hyperlipidemia in  his father and mother; Hypertension in his father and mother; Hypothyroidism in his sister; Peripheral vascular disease in his brother; Prostate cancer in his paternal uncle. There is no history of Stroke or Diabetes.  ROS:   Please see the history of present illness.    As stated in the HPI and negative for all other systems.   Prior CV studies:   The following studies were reviewed today:  Labs/Other Tests and Data Reviewed:    EKG:  No ECG reviewed.  Recent Labs: 10/06/2017: ALT 31; BUN 13; Creat 1.09; Hemoglobin 15.7; Platelets 185; Potassium 4.6; Sodium 141; TSH 2.07   Recent Lipid Panel Lab Results  Component Value Date/Time   CHOL 119 10/06/2017 08:33 AM  TRIG 114 10/06/2017 08:33 AM   HDL 42 10/06/2017 08:33 AM   CHOLHDL 2.8 10/06/2017 08:33 AM   LDLCALC 57 10/06/2017 08:33 AM    Wt Readings from Last 3 Encounters:  09/28/18 187 lb (84.8 kg)  07/10/18 185 lb (83.9 kg)  05/28/18 185 lb 12.8 oz (84.3 kg)     Objective:    Vital Signs:  BP 118/72   Pulse (!) 57   Ht 5\' 8"  (1.727 m)   Wt 187 lb (84.8 kg)   SpO2 97%   BMI 28.43 kg/m    VITAL SIGNS:  reviewed GEN:  no acute distress NEURO:  alert and oriented x 3, no obvious focal deficit PSYCH:  normal affect  ASSESSMENT & PLAN:    CAD:    The patient has no new sypmtoms.  No further cardiovascular testing is indicated.  We will continue with aggressive risk reduction and meds as listed.   Of note he is on DAPT for life.   DYSLIPIDEMIA:    An excellent lipid profile previously.  He should get this again this summer per his primary doctor.  HTN:   His BP is well controlled.  Continue current therapy.    PALPITATIONS:    He is no longer bothered by these.  No change in therapy.  COVID-19 Education: The signs and symptoms of COVID-19 were discussed with the patient and how to seek care for testing (follow up with PCP or arrange E-visit).  The importance of social distancing was discussed today.  Time:    Today, I have spent 16  minutes with the patient with telehealth technology discussing the above problems.     Medication Adjustments/Labs and Tests Ordered: Current medicines are reviewed at length with the patient today.  Concerns regarding medicines are outlined above.   Tests Ordered: No orders of the defined types were placed in this encounter.   Medication Changes: No orders of the defined types were placed in this encounter.   Disposition:  Follow up one year  Signed, Minus Breeding, MD  09/28/2018 9:58 AM    [place]

## 2018-09-27 NOTE — Telephone Encounter (Signed)
Prefers to use laptop for virtual/ smartphone/ my chart/virtual consent/ pre reg completed

## 2018-09-28 ENCOUNTER — Encounter: Payer: Self-pay | Admitting: Cardiology

## 2018-09-28 ENCOUNTER — Telehealth (INDEPENDENT_AMBULATORY_CARE_PROVIDER_SITE_OTHER): Payer: 59 | Admitting: Cardiology

## 2018-09-28 VITALS — BP 118/72 | HR 57 | Ht 68.0 in | Wt 187.0 lb

## 2018-09-28 DIAGNOSIS — R002 Palpitations: Secondary | ICD-10-CM

## 2018-09-28 DIAGNOSIS — I2510 Atherosclerotic heart disease of native coronary artery without angina pectoris: Secondary | ICD-10-CM

## 2018-09-28 DIAGNOSIS — Z7189 Other specified counseling: Secondary | ICD-10-CM

## 2018-09-28 MED ORDER — LISINOPRIL 5 MG PO TABS: 5.0000 mg | ORAL_TABLET | Freq: Every day | ORAL | 3 refills | Status: DC

## 2018-09-28 MED ORDER — NITROGLYCERIN 0.4 MG/SPRAY TL SOLN: 1.0000 | 1 refills | Status: DC | PRN

## 2018-09-28 MED ORDER — ATORVASTATIN CALCIUM 40 MG PO TABS: 40.0000 mg | ORAL_TABLET | Freq: Every day | ORAL | 3 refills | Status: DC

## 2018-09-28 NOTE — Patient Instructions (Signed)

## 2018-10-09 ENCOUNTER — Encounter: Payer: 59 | Admitting: Family Medicine

## 2018-11-13 ENCOUNTER — Encounter: Payer: Self-pay | Admitting: Family Medicine

## 2018-11-13 ENCOUNTER — Ambulatory Visit (INDEPENDENT_AMBULATORY_CARE_PROVIDER_SITE_OTHER): Payer: 59 | Admitting: Family Medicine

## 2018-11-13 ENCOUNTER — Other Ambulatory Visit: Payer: Self-pay

## 2018-11-13 VITALS — BP 114/75 | HR 59 | Temp 98.1°F | Resp 18 | Ht 68.0 in | Wt 187.5 lb

## 2018-11-13 DIAGNOSIS — M5431 Sciatica, right side: Secondary | ICD-10-CM

## 2018-11-13 NOTE — Patient Instructions (Signed)
It was nice to meet you today.   I have referred you to physical therapy. They will call to schedule.  Try to avoid heavy lifting. Rest. Tylenol for discomfort.  Sciatica  Sciatica is pain, numbness, weakness, or tingling along your sciatic nerve. The sciatic nerve starts in the lower back and goes down the back of each leg. Sciatica happens when this nerve is pinched or has pressure put on it. Sciatica usually goes away on its own or with treatment. Sometimes, sciatica may keep coming back (recur). Follow these instructions at home: Medicines  Take over-the-counter and prescription medicines only as told by your doctor.  Do not drive or use heavy machinery while taking prescription pain medicine. Managing pain  If directed, put ice on the affected area. ? Put ice in a plastic bag. ? Place a towel between your skin and the bag. ? Leave the ice on for 20 minutes, 2-3 times a day.  After icing, apply heat to the affected area before you exercise or as often as told by your doctor. Use the heat source that your doctor tells you to use, such as a moist heat pack or a heating pad. ? Place a towel between your skin and the heat source. ? Leave the heat on for 20-30 minutes. ? Remove the heat if your skin turns bright red. This is especially important if you are unable to feel pain, heat, or cold. You may have a greater risk of getting burned. Activity  Return to your normal activities as told by your doctor. Ask your doctor what activities are safe for you. ? Avoid activities that make your sciatica worse.  Take short rests during the day. Rest in a lying or standing position. This is usually better than sitting to rest. ? When you rest for a long time, do some physical activity or stretching between periods of rest. ? Avoid sitting for a long time without moving. Get up and move around at least one time each hour.  Exercise and stretch regularly, as told by your doctor.  Do not lift  anything that is heavier than 10 lb (4.5 kg) while you have symptoms of sciatica. ? Avoid lifting heavy things even when you do not have symptoms. ? Avoid lifting heavy things over and over.  When you lift objects, always lift in a way that is safe for your body. To do this, you should: ? Bend your knees. ? Keep the object close to your body. ? Avoid twisting. General instructions  Use good posture. ? Avoid leaning forward when you are sitting. ? Avoid hunching over when you are standing.  Stay at a healthy weight.  Wear comfortable shoes that support your feet. Avoid wearing high heels.  Avoid sleeping on a mattress that is too soft or too hard. You might have less pain if you sleep on a mattress that is firm enough to support your back.  Keep all follow-up visits as told by your doctor. This is important. Contact a doctor if:  You have pain that: ? Wakes you up when you are sleeping. ? Gets worse when you lie down. ? Is worse than the pain you have had in the past. ? Lasts longer than 4 weeks.  You lose weight for without trying. Get help right away if:  You cannot control when you pee (urinate) or poop (have a bowel movement).  You have weakness in any of these areas and it gets worse. ? Lower back. ?  Lower belly (pelvis). ? Butt (buttocks). ? Legs.  You have redness or swelling of your back.  You have a burning feeling when you pee. This information is not intended to replace advice given to you by your health care provider. Make sure you discuss any questions you have with your health care provider. Document Released: 03/01/2008 Document Revised: 10/29/2015 Document Reviewed: 01/30/2015 Elsevier Interactive Patient Education  2019 [employer].

## 2018-11-13 NOTE — Progress Notes (Signed)
Ricky Chavez , 12-25-1955, 63 y.o., male MRN: [id number] Patient Care Team    Relationship Specialty Notifications Start End  McGowen, Adrian Blackwater, MD PCP - General Family Medicine  12/17/15   Minus Breeding, MD PCP - Cardiology Cardiology Admissions 09/28/18   Druscilla Brownie, MD Consulting Physician Dermatology  01/17/16   Alyson Ingles Candee Furbish, MD Consulting Physician Urology  03/23/16   Franchot Gallo, MD Consulting Physician Urology  03/23/16   Milus Banister, MD Consulting Physician Gastroenterology  04/07/16   Minus Breeding, MD Consulting Physician Cardiology  10/01/18     Chief Complaint  Patient presents with  . Back Pain    Sciatic pain going from Right hip to right ankle. Pt gets some relief from Tylenol. Would like referral to PT [facility] rehab in [place].      Subjective: Pt presents for an OV with complaints of pain of his right sciatic nerve radiating to his ankle since 10/24/2018 when he was lifting something heavy.  He reports he has had 3-4 episodes like this in the past.  He has not responded well to steroid use and NSAIDs are contraindicated secondary to chronic anticoagulation.  He reports in the past he would go to physical therapy and be placed in traction and the symptoms would resolve.  He would like to try to go back to the same physical therapist if possible.   Hip x-ray 04/05/2017. IMPRESSION: Degenerative changes lumbar spine and both hips. No acute bony or joint abnormality identified. Diffuse osteopenia.  Depression screen PHQ 2/9 04/13/2017  Decreased Interest 0  Down, Depressed, Hopeless 0  PHQ - 2 Score 0    No Known Allergies Social History   Social History Narrative   Married, 2 grown children.   Relocated to [place] from [facility] 2016.   Educ: Masters degree   Occup: Retired Government social research officer with [employer]   No tob.   Alc: 1 glass wine/night.   Past Medical History:  Diagnosis Date  . Allergic rhinitis   . BPH with  obstruction/lower urinary tract symptoms   . Coronary artery disease    With preserved EF.  Needs DAPT lifetime due to high risk status.  . Elevated PSA onset @ 2009   Neg bx x 3, most recent 2009 ([facility] in [place]).  PSA stable at 3.98 12/01/14 and 4.61 on 07/25/15 per old records.  PSA 10 at Dr. Noland Fordyce ([facility]) 03/2016: flomax started and plan is to recheck PSA and f/u with urol in 6 mo.  Recheck PSA here 08/2016 was 10.1, then 9.2 on 08/30/16, 10.8 on 04/03/17 at urol f/u: MR prostate 04/2017->BPH. 11/2017 PSA 10.9.  05/2018->6.03  . GERD (gastroesophageal reflux disease)   . Hearing loss of both ears   . History of myocardial infarction 2011  . Hyperlipidemia   . Hypertension   . Insomnia   . Organic impotence   . Palpitations 05/2018   Cards suspects symptomatic PACs or PVCs--conservative mgmt, but monitor if worse/persistent.  Needs to minimize alc and cafff  . 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  May need MRI L spine and/or emg/NCS  . Squamous cell carcinoma    Past Surgical History:  Procedure Laterality Date  . CARDIAC CATHETERIZATION  01/31/13   Stents to LAD patent; small diagonal stent with 90% ostial stenosis--good overall LV function with mild anterior and focal inferoapical hypokinesis, EF 60-65%.  Unsuccessful angioplasty of diagonal.  . CARDIOVASCULAR STRESS TEST  05/26/15  stress echo neg for ischemia  . CARDIOVASCULAR STRESS TEST  10/04/2017   Myoc perf imaging: normal (EF 59%)  . COLONOSCOPY  2008; 06/14/16   2008 Normal.  2018 hyperplastic polyp +internal hemorrhoids.  Recall 2028.  Marland Kitchen HYDROCELE EXCISION / REPAIR  early 2000's  . INGUINAL HERNIA REPAIR Bilateral 1998  . PROSTATE BIOPSY     Multiple; all neg.  Prostate MRI 2018 -NO SIGN OF PROSTATE CA.  Marland Kitchen PTCA     Cath 2011---4 stents--LAD and diagonal.  2014 repeat cath for CP showed no changes.  . Scrotal u/s  01/2015   numerous extratesticular cystic  lesions bilat, c/w numerous epididymal cysts vs loculated hydrocele--following expectantly.   Family History  Problem Relation Age of Onset  . Hyperlipidemia Mother   . Hypertension Mother   . Heart disease Mother 10       CABG  . Heart disease Father 64       CABG  . Hyperlipidemia Father   . Hypertension Father   . Colon polyps Father   . Hypothyroidism Sister   . Heart attack Brother 74       MI  . Prostate cancer Paternal Uncle   . Peripheral vascular disease Brother        Carotid   . Stroke Neg Hx   . Diabetes Neg Hx    Allergies as of 11/13/2018   No Known Allergies     Medication List       Accurate as of November 13, 2018  3:13 PM. If you have any questions, ask your nurse or doctor.        aspirin EC 81 MG tablet Take 1 tablet by mouth daily.   atorvastatin 40 MG tablet Commonly known as:  LIPITOR Take 1 tablet (40 mg total) by mouth daily.   Brilinta 60 MG Tabs tablet Generic drug:  ticagrelor TAKE 1 TABLET TWICE A DAY   carvedilol 6.25 MG tablet Commonly known as:  COREG Take 1 tablet (6.25 mg total) by mouth 2 (two) times daily.   DUTASTERIDE PO Take by mouth.   Levitra 20 MG tablet Generic drug:  vardenafil Take 1 tablet by mouth daily as needed.   lisinopril 5 MG tablet Commonly known as:  ZESTRIL Take 1 tablet (5 mg total) by mouth daily.   Melatonin Maximum Strength 5 MG Tabs Generic drug:  Melatonin Take 1 tablet by mouth at bedtime.   MULTIVITAMIN ADULT PO Take 1 capsule by mouth daily.   nitroGLYCERIN 0.4 MG/SPRAY spray Commonly known as:  NITROLINGUAL Place 1 spray under the tongue every 5 (five) minutes x 3 doses as needed for chest pain.   omeprazole 20 MG capsule Commonly known as:  PRILOSEC Take 1 capsule by mouth daily.   tamsulosin 0.4 MG Caps capsule Commonly known as:  FLOMAX Take 0.4 mg by mouth daily.       All past medical history, surgical history, allergies, family history, immunizations andmedications were  updated in the EMR today and reviewed under the history and medication portions of their EMR.     ROS: Negative, with the exception of above mentioned in HPI   Objective:  BP 114/75 (BP Location: Left Arm, Patient Position: Sitting, Cuff Size: Normal)   Pulse (!) 59   Temp 98.1 F (36.7 C) (Temporal)   Resp 18   Ht 5\' 8"  (1.727 m)   Wt 187 lb 8 oz (85 kg)   SpO2 96%   BMI 28.51 kg/m  Body  mass index is 28.51 kg/m. Gen: Afebrile. No acute distress. Nontoxic in appearance, well developed, well nourished.  HENT: AT. . Eyes:Pupils Equal Round Reactive to light, Extraocular movements intact,  Conjunctiva without redness, discharge or icterus. MSK: No erythema, no soft tissue swelling.  Full range of motion bilateral extremities.  Muscle strength 5/5 bilateral.  Neurovascular intact distally. Neuro: Normal gait. PERLA. EOMi. Alert. Oriented x3   No exam data present No results found. No results found for this or any previous visit (from the past 24 hour(s)).  Assessment/Plan: AVROHOM MCKELVIN is a 63 y.o. male present for OV for  Sciatica of right side Rest, heat therapy.  Tylenol for discomfort. NSAIDs contraindicated. Steroids were not helpful in the past. - Ambulatory referral to Physical Therapy (Mali Applewhite at [facility] rehab in [place]) Follow-up PRN   Reviewed expectations re: course of current medical issues.  Discussed self-management of symptoms.  Outlined signs and symptoms indicating need for more acute intervention.  Patient verbalized understanding and all questions were answered.  Patient received an After-Visit Summary.    No orders of the defined types were placed in this encounter.    Note is dictated utilizing voice recognition software. Although note has been proof read prior to signing, occasional typographical errors still can be missed. If any questions arise, please do not hesitate to call for verification.   electronically signed by:   Howard Pouch, DO  [place]

## 2018-11-14 ENCOUNTER — Telehealth (HOSPITAL_COMMUNITY): Payer: Self-pay | Admitting: Specialist

## 2018-11-14 NOTE — Telephone Encounter (Signed)
Pt called back to schedule -I told him we were in [place] -ask pt did he want to come here or go to [place]. He stated he has always gone to [place] and would like to go back. I will call [facility] and ask them to call Mr. Nuzum to schedule his PT. Transferred Referral to [facility] office in epic. NF

## 2018-11-15 ENCOUNTER — Encounter: Payer: Self-pay | Admitting: Family Medicine

## 2018-11-15 LAB — PSA: PSA: 5.98

## 2018-11-20 ENCOUNTER — Other Ambulatory Visit: Payer: Self-pay

## 2018-11-20 ENCOUNTER — Ambulatory Visit: Payer: 59 | Attending: Family Medicine | Admitting: Physical Therapy

## 2018-11-20 DIAGNOSIS — M545 Low back pain: Secondary | ICD-10-CM | POA: Diagnosis present

## 2018-11-20 DIAGNOSIS — M5441 Lumbago with sciatica, right side: Secondary | ICD-10-CM | POA: Diagnosis present

## 2018-11-20 DIAGNOSIS — G8929 Other chronic pain: Secondary | ICD-10-CM | POA: Insufficient documentation

## 2018-11-20 NOTE — Therapy (Addendum)
[facility] [place], [place], [address] Phone: [phone]   Fax:  [phone]  Physical Therapy Evaluation  Patient Details  Name: Ricky Chavez MRN: [id number] Date of Birth: 1955/10/10 Referring Provider (PT): Howard Pouch   Encounter Date: 11/20/2018  PT End of Session - 11/20/18 1323    Visit Number  1    Number of Visits  12    Date for PT Re-Evaluation  01/01/19    PT Start Time  1025    PT Stop Time  1358    PT Time Calculation (min)  59 min    Activity Tolerance  Patient tolerated treatment well    Behavior During Therapy  [facility] for tasks assessed/performed       Past Medical History:  Diagnosis Date  . Allergic rhinitis   . BPH with obstruction/lower urinary tract symptoms   . Coronary artery disease    With preserved EF.  Needs DAPT lifetime due to high risk status.  . Elevated PSA onset @ 2009   Neg bx x 3, most recent 2009 ([facility] in [place]).  PSA stable at 3.98 12/01/14 and 4.61 on 07/25/15 per old records.  PSA 10 at Dr. Noland Fordyce ([facility]) 03/2016: flomax started and plan is to recheck PSA and f/u with urol in 6 mo.  Recheck PSA here 08/2016 was 10.1, then 9.2 on 08/30/16, 10.8 on 04/03/17 at urol f/u: MR prostate 04/2017->BPH. 11/2017 PSA 10.9.  05/2018->6.03  . GERD (gastroesophageal reflux disease)   . Hearing loss of both ears   . History of myocardial infarction 2011  . Hyperlipidemia   . Hypertension   . Insomnia   . Organic impotence   . Palpitations 05/2018   Cards suspects symptomatic PACs or PVCs--conservative mgmt, but monitor if worse/persistent.  Needs to minimize alc and cafff  . 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  May need MRI L spine and/or emg/NCS  . Squamous cell carcinoma     Past Surgical History:  Procedure Laterality Date  . CARDIAC CATHETERIZATION  01/31/13   Stents to LAD patent; small diagonal stent with 90%  ostial stenosis--good overall LV function with mild anterior and focal inferoapical hypokinesis, EF 60-65%.  Unsuccessful angioplasty of diagonal.  . CARDIOVASCULAR STRESS TEST  05/26/15   stress echo neg for ischemia  . CARDIOVASCULAR STRESS TEST  10/04/2017   Myoc perf imaging: normal (EF 59%)  . COLONOSCOPY  2008; 06/14/16   2008 Normal.  2018 hyperplastic polyp +internal hemorrhoids.  Recall 2028.  Marland Kitchen HYDROCELE EXCISION / REPAIR  early 2000's  . INGUINAL HERNIA REPAIR Bilateral 1998  . PROSTATE BIOPSY     Multiple; all neg.  Prostate MRI 2018 -NO SIGN OF PROSTATE CA.  Marland Kitchen PTCA     Cath 2011---4 stents--LAD and diagonal.  2014 repeat cath for CP showed no changes.  . Scrotal u/s  01/2015   numerous extratesticular cystic lesions bilat, c/w numerous epididymal cysts vs loculated hydrocele--following expectantly.    There were no vitals filed for this visit.   Subjective Assessment - 11/20/18 1337    Subjective  COVID-19 screen performed prior to patient entering clinic.  The patient states he had been doing great since last seen in PT until 10/24/18 when he was lifting very heavy object and twisting at the same time.  He is experiencing pain in his right low back with symptoms down into his right buttock, posterior thigh to ankle.  His  pain at rest is a 5/10 but can rise to a 7+/10 with prolonged standing.    Pertinent History  H/o low backpain.    How long can you stand comfortably?  1-2 minutes.    Patient Stated Goals  Get out of pain.    Currently in Pain?  Yes    Pain Score  5     Pain Location  Back    Pain Orientation  Right;Lower    Pain Descriptors / Indicators  Aching;Shooting    Pain Type  Acute pain    Pain Onset  1 to 4 weeks ago    Pain Frequency  Constant    Aggravating Factors   See above.    Pain Relieving Factors  See above.         [facility] PT Assessment - 11/20/18 0001      Assessment   Medical Diagnosis  Sciatica of right side.    Referring Provider (PT)  Howard Pouch      Precautions   Precautions  None      Restrictions   Weight Bearing Restrictions  No      Balance Screen   Has the patient fallen in the past 6 months  No    Has the patient had a decrease in activity level because of a fear of falling?   No    Is the patient reluctant to leave their home because of a fear of falling?   No      Home Environment   Living Environment  Private residence      Prior Function   Level of Independence  Independent      Observation/Other Assessments   Focus on Therapeutic Outcomes (FOTO)   58% limitation.      Posture/Postural Control   Posture/Postural Control  No significant limitations      AROM   Overall AROM Comments  Normal active lumbar flexion and extension to 20 degrees.      Strength   Overall Strength Comments  Normal bilateral LE strength.      Palpation   Palpation comment  Tender to palption over right SIJ and right lower lumbar erector spinae musculature.      Special Tests   Other special tests  (-) SLR on right.  Right LE slightly shorter on right.  Absent LE DTR's.      Ambulation/Gait   Gait Comments  WNL.                Objective measurements completed on examination: See above findings.      [facility] Adult PT Treatment/Exercise - 11/20/18 0001      Modalities   Modalities  Traction      Traction   Type of Traction  Lumbar    Min (lbs)  5    Max (lbs)  85    Hold Time  99    Rest Time  5    Time  15                  PT Long Term Goals - 11/20/18 1402      PT LONG TERM GOAL #1   Title  Independent with an HEP.    Time  6    Period  Weeks    Status  New      PT LONG TERM GOAL #2   Title  Stand 30 minutes with pain not > 2/10.    Time  6  Period  Weeks    Status  New      PT LONG TERM GOAL #3   Title  Walk a community distance with pain not > 2/10.    Time  6    Period  Weeks    Status  New      PT LONG TERM GOAL #4   Title  Perform ADL's with pain not > 2-3/10.     Time  6    Period  Weeks    Status  New      PT LONG TERM GOAL #5   Title  Eliminate right LE pain.    Time  6    Period  Weeks    Status  New             Plan - 11/20/18 1346    Clinical Impression Statement  The patient presents to OPPT with c/o right sided low back pain due to a heavy lifting and twisting injury on 10/23/08.  He has pain radiating from his right low back down the length of his leg to his right ankle.  He can stand for only a short period of time.  He is tender to palption over his right SIJ and lower lumbar erector spinae musculature.  His right LE is slightly shorter than his left due to a posterior pelvic rotation.Patient will benefit from skilled physical therapy intervention to address deficits and pain.    Personal Factors and Comorbidities  Comorbidity 1    Stability/Clinical Decision Making  Stable/Uncomplicated    Clinical Decision Making  Low    Rehab Potential  Excellent    PT Frequency  2x / week    PT Duration  6 weeks    PT Treatment/Interventions  ADLs/Self Care Home Management;Cryotherapy;Electrical Stimulation;Ultrasound;Moist Heat;Therapeutic activities;Therapeutic exercise;Patient/family education;Manual techniques;Dry needling;Traction;Passive range of motion;Joint Manipulations;Spinal Manipulations;Other (comment)    PT Next Visit Plan  STW/M and combo e'stim/U/S to right low back and SIJ, dry needling; core exercises, hip bridges, prone leg lifts; int traction at 90# (max 100-105#).       Patient will benefit from skilled therapeutic intervention in order to improve the following deficits and impairments:  Pain, Decreased range of motion, Decreased activity tolerance  Visit Diagnosis: 1. Acute right-sided low back pain with right-sided sciatica        Problem List Patient Active Problem List   Diagnosis Date Noted  . Palpitations 07/10/2018  . Chest tightness 09/25/2017  . Sciatica of right side 04/13/2017  . Coronary artery  disease involving native coronary artery of native heart without angina pectoris 10/03/2016  . Dyslipidemia 10/03/2016  . Coronary artery disease involving coronary bypass graft of native heart without angina pectoris 04/01/2016  . Essential hypertension 04/01/2016    Destyn Parfitt, [place] MPT 11/20/2018, 2:13 PM  [facility] [address] [place], [place], [address] Phone: [phone]   Fax:  [phone]  Name: Ricky Chavez MRN: [id number] Date of Birth: May 23, 1956

## 2018-11-20 NOTE — Patient Instructions (Signed)
[facility]  DRY NEEDLING CONSENT FORM   Trigger point dry needling is a physical therapy approach to treat Myofascial Pain and Dysfunction.  Dry Needling (DN) is a valuable and effective way to deactivate myofascial trigger points (muscle knots/pain). It is skilled intervention that uses a thin filiform needle to penetrate the skin and stimulate underlying myofascial trigger points, muscular, and connective tissues for the management of neuromusculoskeletal pain and movement impairments.  A local twitch response (LTR) will be elicited.  This can sometimes feel like a deep ache in the muscle during the procedure. Multiple trigger points in multiple muscles can be treated during each treatment.  No medication of any kind is injected.   As with any medical treatment and procedure, there are possible adverse events.  While significant adverse events are uncommon, they do sometimes occur and must be considered prior to giving consent.  1. Dry needling often causes a "post needling soreness".  There can be an increase in pain from a couple of hours to 2-3 days, followed by an improvement in the overall pain state. 2. Any time a needle is used there is a risk of infection.  However, we are using new, sterile, and disposable needles; infections are extremely rare. 3. There is a possibility that you may bleed or bruise.  You may feel tired and some nausea following treatment. 4. There is a rare possibility of a pneumothorax (air in the chest cavity). 5. Allergic reaction to nickel in the stainless steel needle. 6. If a nerve is touched, it may cause paresthesia (a prickling/shock sensation) which is usually brief, but may continue for a couple of days.  Following treatment stay hydrated.  Continue regular activities but not too vigorous initially after treatment for 24-48 hours.  Dry Needling is best when combined with other physical therapy interventions such as  strengthening, stretching and other therapeutic modalities.   PLEASE ANSWER THE FOLLOWING QUESTIONS:  Do you have a lack of sensation?   Y/N  Do you have a phobia or fear of needles  Y/N  Are you pregnant?    Y/N If yes:  How many weeks? __________ Do you have any implanted devices?  Y/N If yes:  Pacemaker/Spinal Cord Stimulator/Deep Brain Stimulator/Insulin Pump/Other: ________________ Do you have any implants?  Y/N If yes: Breast/Facial/Pecs/Buttocks/Calves/Hip  Replacement/ Knee Replacement/Other: _________ Do you take any blood thinners?   Y/N If yes: Coumadin (Warfarin)/Other: ___________________ Do you have a bleeding disorder?   Y/N If yes: What kind: _________________________________ Do you take any immunosuppressants?  Y/N If yes:   What kind: _________________________________ Do you take anti-inflammatories?   Y/N If yes: What kind: Advil/Aspirin/Other: ________________ Have you ever been diagnosed with Scoliosis? Y/N Have you had back surgery?   Y/N If yes:  Laminectomy/Fusion/Other: ___________________   I have read, or had read to me, the above.  I have had the opportunity to ask any questions.  All of my questions have been answered to my satisfaction and I understand the risks involved with dry needling.  I consent to examination and treatment at [facility], including dry needling, of any and all of my involved and affected muscles.  

## 2018-11-21 ENCOUNTER — Other Ambulatory Visit: Payer: Self-pay

## 2018-11-21 ENCOUNTER — Encounter: Payer: Self-pay | Admitting: Family Medicine

## 2018-11-21 ENCOUNTER — Ambulatory Visit (INDEPENDENT_AMBULATORY_CARE_PROVIDER_SITE_OTHER): Payer: 59 | Admitting: Family Medicine

## 2018-11-21 VITALS — BP 124/85 | HR 60 | Temp 97.2°F | Resp 18 | Ht 68.0 in | Wt 187.2 lb

## 2018-11-21 DIAGNOSIS — E7800 Pure hypercholesterolemia, unspecified: Secondary | ICD-10-CM | POA: Diagnosis not present

## 2018-11-21 DIAGNOSIS — I10 Essential (primary) hypertension: Secondary | ICD-10-CM | POA: Diagnosis not present

## 2018-11-21 DIAGNOSIS — Z0000 Encounter for general adult medical examination without abnormal findings: Secondary | ICD-10-CM

## 2018-11-21 DIAGNOSIS — I2510 Atherosclerotic heart disease of native coronary artery without angina pectoris: Secondary | ICD-10-CM

## 2018-11-21 DIAGNOSIS — Z23 Encounter for immunization: Secondary | ICD-10-CM

## 2018-11-21 DIAGNOSIS — I2583 Coronary atherosclerosis due to lipid rich plaque: Secondary | ICD-10-CM

## 2018-11-21 NOTE — Progress Notes (Signed)
Office Note 11/21/2018  CC:  Chief Complaint  Patient presents with  . Annual Exam    pt is fasting    HPI:  Ricky Chavez is a 63 y.o. White male who is here for annual health maintenance exam.  Feeling good. Exercising irregularly, mainly b/c of left sided sciatica.  He has already arranged PT.  Past Medical History:  Diagnosis Date  . Allergic rhinitis   . BPH with obstruction/lower urinary tract symptoms   . Coronary artery disease    With preserved EF.  Needs DAPT lifetime due to high risk status.  . Elevated PSA onset @ 2009   Neg bx x 3, most recent 2009 ([facility] in [place]).  PSA stable at 3.98 12/01/14 and 4.61 on 07/25/15 per old records.  PSA 10 at Dr. Noland Fordyce ([facility]) 03/2016: flomax started and plan is to recheck PSA and f/u with urol in 6 mo.  Recheck PSA here 08/2016 was 10.1, then 9.2 on 08/30/16, 10.8 on 04/03/17 at urol f/u: MR prostate 04/2017->BPH. 11/2017 PSA 10.9.  05/2018->6.03  . GERD (gastroesophageal reflux disease)   . Hearing loss of both ears   . History of myocardial infarction 2011  . Hyperlipidemia   . Hypertension   . Insomnia   . Organic impotence   . Palpitations 05/2018   Cards suspects symptomatic PACs or PVCs--conservative mgmt, but monitor if worse/persistent.  Needs to minimize alc and cafff  . 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  May need MRI L spine and/or emg/NCS  . Squamous cell carcinoma     Past Surgical History:  Procedure Laterality Date  . CARDIAC CATHETERIZATION  01/31/13   Stents to LAD patent; small diagonal stent with 90% ostial stenosis--good overall LV function with mild anterior and focal inferoapical hypokinesis, EF 60-65%.  Unsuccessful angioplasty of diagonal.  . CARDIOVASCULAR STRESS TEST  05/26/15   stress echo neg for ischemia  . CARDIOVASCULAR STRESS TEST  10/04/2017   Myoc perf imaging: normal (EF 59%)  . COLONOSCOPY  2008; 06/14/16   2008  Normal.  2018 hyperplastic polyp +internal hemorrhoids.  Recall 2028.  Marland Kitchen HYDROCELE EXCISION / REPAIR  early 2000's  . INGUINAL HERNIA REPAIR Bilateral 1998  . PROSTATE BIOPSY     Multiple; all neg.  Prostate MRI 2018 -NO SIGN OF PROSTATE CA.  Marland Kitchen PTCA     Cath 2011---4 stents--LAD and diagonal.  2014 repeat cath for CP showed no changes.  . Scrotal u/s  01/2015   numerous extratesticular cystic lesions bilat, c/w numerous epididymal cysts vs loculated hydrocele--following expectantly.    Family History  Problem Relation Age of Onset  . Hyperlipidemia Mother   . Hypertension Mother   . Heart disease Mother 20       CABG  . Heart disease Father 32       CABG  . Hyperlipidemia Father   . Hypertension Father   . Colon polyps Father   . Hypothyroidism Sister   . Heart attack Brother 106       MI  . Prostate cancer Paternal Uncle   . Peripheral vascular disease Brother        Carotid   . Stroke Neg Hx   . Diabetes Neg Hx     Social History   Socioeconomic History  . Marital status: Married    Spouse name: Not on file  . Number of children: 2  . Years of education: Not on file  .  Highest education level: Not on file  Occupational History  . Occupation: retired  Scientific laboratory technician  . Financial resource strain: Not on file  . Food insecurity    Worry: Not on file    Inability: Not on file  . Transportation needs    Medical: Not on file    Non-medical: Not on file  Tobacco Use  . Smoking status: Former Smoker    Packs/day: 0.25    Years: 2.00    Pack years: 0.50    Types: Cigarettes    Quit date: 06/06/1978    Years since quitting: 40.4  . Smokeless tobacco: Never Used  Substance and Sexual Activity  . Alcohol use: Yes    Alcohol/week: 14.0 standard drinks    Types: 14 Glasses of wine per week  . Drug use: No  . Sexual activity: Not on file  Lifestyle  . Physical activity    Days per week: Not on file    Minutes per session: Not on file  . Stress: Not on file   Relationships  . Social Herbalist on phone: Not on file    Gets together: Not on file    Attends religious service: Not on file    Active member of club or organization: Not on file    Attends meetings of clubs or organizations: Not on file    Relationship status: Not on file  . Intimate partner violence    Fear of current or ex partner: Not on file    Emotionally abused: Not on file    Physically abused: Not on file    Forced sexual activity: Not on file  Other Topics Concern  . Not on file  Social History Narrative   Married, 2 grown children.   Relocated to [place] from [place] 2016.   Educ: Masters degree   Occup: Retired Government social research officer with [employer]   No tob.   Alc: 1 glass wine/night.    Outpatient Medications Prior to Visit  Medication Sig Dispense Refill  . aspirin EC 81 MG tablet Take 1 tablet by mouth daily.    Marland Kitchen atorvastatin (LIPITOR) 40 MG tablet Take 1 tablet (40 mg total) by mouth daily. 90 tablet 3  . BRILINTA 60 MG TABS tablet TAKE 1 TABLET TWICE A DAY 180 tablet 3  . carvedilol (COREG) 6.25 MG tablet Take 1 tablet (6.25 mg total) by mouth 2 (two) times daily. 180 tablet 2  . DUTASTERIDE PO Take by mouth.    Marland Kitchen lisinopril (ZESTRIL) 5 MG tablet Take 1 tablet (5 mg total) by mouth daily. 90 tablet 3  . Melatonin (MELATONIN MAXIMUM STRENGTH) 5 MG TABS Take 1 tablet by mouth at bedtime.    . Multiple Vitamins-Minerals (MULTIVITAMIN ADULT PO) Take 1 capsule by mouth daily.    Marland Kitchen omeprazole (PRILOSEC) 20 MG capsule Take 1 capsule by mouth daily.    . tamsulosin (FLOMAX) 0.4 MG CAPS capsule Take 0.4 mg by mouth daily.    . nitroGLYCERIN (NITROLINGUAL) 0.4 MG/SPRAY spray Place 1 spray under the tongue every 5 (five) minutes x 3 doses as needed for chest pain. (Patient not taking: Reported on 11/21/2018) 12 g 1  . vardenafil (LEVITRA) 20 MG tablet Take 1 tablet by mouth daily as needed.     No facility-administered medications prior to visit.     No  Known Allergies  ROS Review of Systems  Constitutional: Negative for appetite change, chills, fatigue and fever.  HENT: Negative for congestion, dental  problem, ear pain and sore throat.   Eyes: Negative for discharge, redness and visual disturbance.  Respiratory: Negative for cough, chest tightness, shortness of breath and wheezing.   Cardiovascular: Negative for chest pain, palpitations and leg swelling.  Gastrointestinal: Negative for abdominal pain, blood in stool, diarrhea, nausea and vomiting.  Genitourinary: Negative for difficulty urinating, dysuria, flank pain, frequency, hematuria and urgency.  Musculoskeletal: Negative for arthralgias, back pain, joint swelling, myalgias and neck stiffness.       L leg radiating pain and paresthesias down to ankle   Skin: Negative for pallor and rash.  Neurological: Negative for dizziness, speech difficulty, weakness and headaches.  Hematological: Negative for adenopathy. Does not bruise/bleed easily.  Psychiatric/Behavioral: Negative for confusion and sleep disturbance. The patient is not nervous/anxious.     PE; Blood pressure 124/85, pulse 60, temperature (!) 97.2 F (36.2 C), temperature source Temporal, resp. rate 18, height 5\' 8"  (1.727 m), weight 187 lb 3.2 oz (84.9 kg), SpO2 98 %. Gen: Alert, well appearing.  Patient is oriented to person, place, time, and situation. AFFECT: pleasant, lucid thought and speech. ENT: Ears: EACs clear, normal epithelium.  TMs with good light reflex and landmarks bilaterally.  Eyes: no injection, icteris, swelling, or exudate.  EOMI, PERRLA. Nose: no drainage or turbinate edema/swelling.  No injection or focal lesion.  Mouth: lips without lesion/swelling.  Oral mucosa pink and moist.  Dentition intact and without obvious caries or gingival swelling.  Oropharynx without erythema, exudate, or swelling.  Neck: supple/nontender.  No LAD, mass, or TM.  Carotid pulses 2+ bilaterally, without bruits. CV: RRR, no  m/r/g.   LUNGS: CTA bilat, nonlabored resps, good aeration in all lung fields. ABD: soft, NT, ND, BS normal.  No hepatospenomegaly or mass.  No bruits. EXT: no clubbing, cyanosis, or edema.  Musculoskeletal: no joint swelling, erythema, warmth, or tenderness.  ROM of all joints intact. Skin - no sores or suspicious lesions or rashes or color changes Rectal: deferred b/c this is done by his urologist  Pertinent labs:  Lab Results  Component Value Date   TSH 2.07 10/06/2017   Lab Results  Component Value Date   WBC 6.2 10/06/2017   HGB 15.7 10/06/2017   HCT 44.2 10/06/2017   MCV 88.6 10/06/2017   PLT 185 10/06/2017   Lab Results  Component Value Date   CREATININE 1.09 10/06/2017   BUN 13 10/06/2017   NA 141 10/06/2017   K 4.6 10/06/2017   CL 105 10/06/2017   CO2 27 10/06/2017   Lab Results  Component Value Date   ALT 31 10/06/2017   AST 30 10/06/2017   ALKPHOS 72 09/01/2016   BILITOT 1.1 10/06/2017   Lab Results  Component Value Date   CHOL 119 10/06/2017   Lab Results  Component Value Date   HDL 42 10/06/2017   Lab Results  Component Value Date   LDLCALC 57 10/06/2017   Lab Results  Component Value Date   TRIG 114 10/06/2017   Lab Results  Component Value Date   CHOLHDL 2.8 10/06/2017   Lab Results  Component Value Date   PSA 6.03 05/11/2018   PSA 9.2 09/26/2016   PSA 10.1 (H) 09/01/2016   Lab Results  Component Value Date   HGBA1C 5.6 10/06/2017    ASSESSMENT AND PLAN:   Health maintenance exam: Reviewed age and gender appropriate health maintenance issues (prudent diet, regular exercise, health risks of tobacco and excessive alcohol, use of seatbelts, fire alarms in home, use  of sunscreen).  Also reviewed age and gender appropriate health screening as well as vaccine recommendations. Vaccines: UTD.  Shingrix discussed-->#1 given today. Labs: Health panel labs ordered. Prostate ca screening: hx of elevated PSA, followed by urologist.   Following PSA's has been reassuring. Colon ca screening: recall 2028.  An After Visit Summary was printed and given to the patient.  FOLLOW UP:  No follow-ups on file.  Signed:  Crissie Sickles, MD           11/21/2018

## 2018-11-21 NOTE — Patient Instructions (Signed)

## 2018-11-21 NOTE — Addendum Note (Signed)
Addended by: Deveron Furlong D on: 11/21/2018 02:28 PM   Modules accepted: Orders

## 2018-11-22 ENCOUNTER — Encounter: Payer: Self-pay | Admitting: Family Medicine

## 2018-11-22 LAB — CBC WITH DIFFERENTIAL/PLATELET
Absolute Monocytes: 592 cells/uL (ref 200–950)
Basophils Absolute: 52 cells/uL (ref 0–200)
Basophils Relative: 0.6 %
Eosinophils Absolute: 183 cells/uL (ref 15–500)
Eosinophils Relative: 2.1 %
HCT: 45.3 % (ref 38.5–50.0)
Hemoglobin: 15.6 g/dL (ref 13.2–17.1)
Lymphs Abs: 2384 cells/uL (ref 850–3900)
MCH: 31 pg (ref 27.0–33.0)
MCHC: 34.4 g/dL (ref 32.0–36.0)
MCV: 89.9 fL (ref 80.0–100.0)
MPV: 10.2 fL (ref 7.5–12.5)
Monocytes Relative: 6.8 %
Neutro Abs: 5490 cells/uL (ref 1500–7800)
Neutrophils Relative %: 63.1 %
Platelets: 184 10*3/uL (ref 140–400)
RBC: 5.04 10*6/uL (ref 4.20–5.80)
RDW: 12.7 % (ref 11.0–15.0)
Total Lymphocyte: 27.4 %
WBC: 8.7 10*3/uL (ref 3.8–10.8)

## 2018-11-22 LAB — LIPID PANEL
Cholesterol: 143 mg/dL
HDL: 44 mg/dL
LDL Cholesterol (Calc): 76 mg/dL
Non-HDL Cholesterol (Calc): 99 mg/dL
Total CHOL/HDL Ratio: 3.3 (calc)
Triglycerides: 152 mg/dL — ABNORMAL HIGH

## 2018-11-22 LAB — COMPREHENSIVE METABOLIC PANEL WITH GFR
AG Ratio: 1.8 (calc) (ref 1.0–2.5)
ALT: 29 U/L (ref 9–46)
AST: 28 U/L (ref 10–35)
Albumin: 4.4 g/dL (ref 3.6–5.1)
Alkaline phosphatase (APISO): 57 U/L (ref 35–144)
BUN: 17 mg/dL (ref 7–25)
CO2: 25 mmol/L (ref 20–32)
Calcium: 9.6 mg/dL (ref 8.6–10.3)
Chloride: 105 mmol/L (ref 98–110)
Creat: 0.91 mg/dL (ref 0.70–1.25)
Globulin: 2.4 g/dL (ref 1.9–3.7)
Glucose, Bld: 96 mg/dL (ref 65–99)
Potassium: 4.3 mmol/L (ref 3.5–5.3)
Sodium: 140 mmol/L (ref 135–146)
Total Bilirubin: 1.2 mg/dL (ref 0.2–1.2)
Total Protein: 6.8 g/dL (ref 6.1–8.1)

## 2018-11-23 ENCOUNTER — Other Ambulatory Visit: Payer: Self-pay

## 2018-11-23 ENCOUNTER — Ambulatory Visit: Payer: 59 | Admitting: Physical Therapy

## 2018-11-23 DIAGNOSIS — G8929 Other chronic pain: Secondary | ICD-10-CM

## 2018-11-23 DIAGNOSIS — M5441 Lumbago with sciatica, right side: Secondary | ICD-10-CM | POA: Diagnosis not present

## 2018-11-23 DIAGNOSIS — M545 Low back pain, unspecified: Secondary | ICD-10-CM

## 2018-11-23 NOTE — Therapy (Signed)
[facility] [place], [place], [address] Phone: [phone]   Fax:  [phone]  Physical Therapy Treatment  Patient Details  Name: Ricky Chavez MRN: [id number] Date of Birth: [birth year] Referring Provider (PT): Howard Pouch   Encounter Date: 11/23/2018  PT End of Session - 11/23/18 1104    Visit Number  2    Number of Visits  12    Date for PT Re-Evaluation  01/01/19    PT Start Time  1025    PT Stop Time  1124    PT Time Calculation (min)  59 min    Activity Tolerance  Patient tolerated treatment well    Behavior During Therapy  [facility] for tasks assessed/performed       Past Medical History:  Diagnosis Date  . Allergic rhinitis   . BPH with obstruction/lower urinary tract symptoms   . Coronary artery disease    With preserved EF.  Needs DAPT lifetime due to high risk status.  . Elevated PSA onset @ 2009   Neg bx x 3, most recent 2009 ([facility] in [place]).  PSA stable at 3.98 12/01/14 and 4.61 on 07/25/15 per old records.  PSA 10 at Dr. Noland Fordyce ([facility]) 03/2016: flomax started and plan is to recheck PSA and f/u with urol in 6 mo.  Recheck PSA here 08/2016 was 10.1, then 9.2 on 08/30/16, 10.8 on 04/03/17 at urol f/u: MR prostate 04/2017->BPH. 11/2017 PSA 10.9.  05/2018->6.03  . GERD (gastroesophageal reflux disease)   . Hearing loss of both ears   . History of myocardial infarction 2011  . Hyperlipidemia   . Hypertension   . Insomnia   . Organic impotence   . Palpitations 05/2018   Cards suspects symptomatic PACs or PVCs--conservative mgmt, but monitor if worse/persistent.  Needs to minimize alc and cafff  . 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  May need MRI L spine and/or emg/NCS  . Squamous cell carcinoma     Past Surgical History:  Procedure Laterality Date  . CARDIAC CATHETERIZATION  01/31/13   Stents to LAD patent; small diagonal stent with 90%  ostial stenosis--good overall LV function with mild anterior and focal inferoapical hypokinesis, EF 60-65%.  Unsuccessful angioplasty of diagonal.  . CARDIOVASCULAR STRESS TEST  05/26/15   stress echo neg for ischemia  . CARDIOVASCULAR STRESS TEST  10/04/2017   Myoc perf imaging: normal (EF 59%)  . COLONOSCOPY  2008; 06/14/16   2008 Normal.  2018 hyperplastic polyp +internal hemorrhoids.  Recall 2028.  Marland Kitchen HYDROCELE EXCISION / REPAIR  early 2000's  . INGUINAL HERNIA REPAIR Bilateral 1998  . PROSTATE BIOPSY     Multiple; all neg.  Prostate MRI 2018 -NO SIGN OF PROSTATE CA.  Marland Kitchen PTCA     Cath 2011---4 stents--LAD and diagonal.  2014 repeat cath for CP showed no changes.  . Scrotal u/s  01/2015   numerous extratesticular cystic lesions bilat, c/w numerous epididymal cysts vs loculated hydrocele--following expectantly.    There were no vitals filed for this visit.  Subjective Assessment - 11/23/18 1104    Subjective  COVID-19 screen performed prior to patient entering clinic.  That last treatment helped a lot then I went to get in my vehicle and flared it up again.    Pertinent History  H/o low backpain.    How long can you stand comfortably?  1-2 minutes.    Patient Stated Goals  Get out of pain.  Currently in Pain?  Yes    Pain Score  5     Pain Location  Back    Pain Orientation  Right;Lower    Pain Descriptors / Indicators  Aching;Shooting    Pain Type  Acute pain    Pain Onset  1 to 4 weeks ago                       [facility] Adult PT Treatment/Exercise - 11/23/18 0001      Ultrasound   Ultrasound Location  Right SIJ region.    Ultrasound Parameters  Combo e'stim/U/S at 1.50 W/CM2 x 12 minutes.      Traction   Type of Traction  Lumbar    Min (lbs)  5    Max (lbs)  90    Hold Time  99    Rest Time  5    Time  15      Manual Therapy   Manual Therapy  Soft tissue mobilization    Soft tissue mobilization  Prone over pillow for comfort:  STW/M x 12 minutes to right  SIJ and low back musculature to decrease tone.                  PT Long Term Goals - 11/20/18 1402      PT LONG TERM GOAL #1   Title  Independent with an HEP.    Time  6    Period  Weeks    Status  New      PT LONG TERM GOAL #2   Title  Stand 30 minutes with pain not > 2/10.    Time  6    Period  Weeks    Status  New      PT LONG TERM GOAL #3   Title  Walk a community distance with pain not > 2/10.    Time  6    Period  Weeks    Status  New      PT LONG TERM GOAL #4   Title  Perform ADL's with pain not > 2-3/10.    Time  6    Period  Weeks    Status  New      PT LONG TERM GOAL #5   Title  Eliminate right LE pain.    Time  6    Period  Weeks    Status  New            Plan - 11/23/18 1107    Clinical Impression Statement  Patient already responding well to treatment and reported a reduction in pain after his first treatment.    PT Treatment/Interventions  ADLs/Self Care Home Management;Cryotherapy;Electrical Stimulation;Ultrasound;Moist Heat;Therapeutic activities;Therapeutic exercise;Patient/family education;Manual techniques;Dry needling;Traction;Passive range of motion;Joint Manipulations;Spinal Manipulations;Other (comment)    PT Next Visit Plan  STW/M and combo e'stim/U/S to right low back and SIJ, dry needling; core exercises, hip bridges, prone leg lifts; int traction at 90# (max 100-105#).       Patient will benefit from skilled therapeutic intervention in order to improve the following deficits and impairments:  Pain, Decreased range of motion, Decreased activity tolerance  Visit Diagnosis: 1. Acute right-sided low back pain with right-sided sciatica   2. Chronic right-sided low back pain without sciatica        Problem List Patient Active Problem List   Diagnosis Date Noted  . Palpitations 07/10/2018  . Chest tightness 09/25/2017  . Sciatica of right side 04/13/2017  .  Coronary artery disease involving native coronary artery of  native heart without angina pectoris 10/03/2016  . Dyslipidemia 10/03/2016  . Coronary artery disease involving coronary bypass graft of native heart without angina pectoris 04/01/2016  . Essential hypertension 04/01/2016    Nyah Shepherd, [place] MPT 11/23/2018, 11:27 AM  [facility] [address] [place], [place], [address] Phone: [phone]   Fax:  [phone]  Name: Ricky Chavez MRN: [id number] Date of Birth: 04/23/1956

## 2018-11-26 ENCOUNTER — Ambulatory Visit: Payer: 59 | Admitting: Physical Therapy

## 2018-11-26 ENCOUNTER — Other Ambulatory Visit: Payer: Self-pay

## 2018-11-26 ENCOUNTER — Telehealth: Payer: Self-pay

## 2018-11-26 DIAGNOSIS — M5441 Lumbago with sciatica, right side: Secondary | ICD-10-CM | POA: Diagnosis not present

## 2018-11-26 DIAGNOSIS — G8929 Other chronic pain: Secondary | ICD-10-CM

## 2018-11-26 NOTE — Therapy (Addendum)
[facility] [place], [place], [address] Phone: [phone]   Fax:  [phone]  Physical Therapy Treatment  Patient Details  Name: Ricky Chavez MRN: [id number] Date of Birth: 1956/03/09 Referring Provider (PT): Howard Pouch   Encounter Date: 11/26/2018  PT End of Session - 11/26/18 1059    Visit Number  3    Number of Visits  12    Date for PT Re-Evaluation  01/01/19    PT Start Time  0917    PT Stop Time  1020    PT Time Calculation (min)  63 min    Activity Tolerance  Patient tolerated treatment well    Behavior During Therapy  [facility] for tasks assessed/performed       Past Medical History:  Diagnosis Date  . Allergic rhinitis   . BPH with obstruction/lower urinary tract symptoms   . Coronary artery disease    With preserved EF.  Needs DAPT lifetime due to high risk status.  . Elevated PSA onset @ 2009   Neg bx x 3, most recent 2009 ([facility] in [place]).  PSA stable at 3.98 12/01/14 and 4.61 on 07/25/15 per old records.  PSA 10 at Dr. Noland Fordyce ([facility]) 03/2016: flomax started and plan is to recheck PSA and f/u with urol in 6 mo.  Recheck PSA here 08/2016 was 10.1, then 9.2 on 08/30/16, 10.8 on 04/03/17 at urol f/u: MR prostate 04/2017->BPH. 11/2017 PSA 10.9.  05/2018->6.03  . GERD (gastroesophageal reflux disease)   . Hearing loss of both ears   . History of myocardial infarction 2011  . Hyperlipidemia   . Hypertension   . Insomnia   . Organic impotence   . Palpitations 05/2018   Cards suspects symptomatic PACs or PVCs--conservative mgmt, but monitor if worse/persistent.  Needs to minimize alc and cafff  . 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  May need MRI L spine and/or emg/NCS  . Squamous cell carcinoma     Past Surgical History:  Procedure Laterality Date  . CARDIAC CATHETERIZATION  01/31/13   Stents to LAD patent; small diagonal stent with 90%  ostial stenosis--good overall LV function with mild anterior and focal inferoapical hypokinesis, EF 60-65%.  Unsuccessful angioplasty of diagonal.  . CARDIOVASCULAR STRESS TEST  05/26/15   stress echo neg for ischemia  . CARDIOVASCULAR STRESS TEST  10/04/2017   Myoc perf imaging: normal (EF 59%)  . COLONOSCOPY  2008; 06/14/16   2008 Normal.  2018 hyperplastic polyp +internal hemorrhoids.  Recall 2028.  Marland Kitchen HYDROCELE EXCISION / REPAIR  early 2000's  . INGUINAL HERNIA REPAIR Bilateral 1998  . PROSTATE BIOPSY     Multiple; all neg.  Prostate MRI 2018 -NO SIGN OF PROSTATE CA.  Marland Kitchen PTCA     Cath 2011---4 stents--LAD and diagonal.  2014 repeat cath for CP showed no changes.  . Scrotal u/s  01/2015   numerous extratesticular cystic lesions bilat, c/w numerous epididymal cysts vs loculated hydrocele--following expectantly.    There were no vitals filed for this visit.  Subjective Assessment - 11/26/18 1009    Subjective  COVID-19 screen performed prior to patient entering clinic.  No new complaints.    Pertinent History  H/o low backpain.    Currently in Pain?  Yes    Pain Score  5     Pain Orientation  Right;Lower    Pain Descriptors / Indicators  Aching;Shooting    Pain Type  Acute pain  Pain Onset  1 to 4 weeks ago                       [facility] Adult PT Treatment/Exercise - 11/26/18 0001      Modalities   Modalities  Electrical Stimulation;Moist Heat      Moist Heat Therapy   Number Minutes Moist Heat  20 Minutes    Moist Heat Location  Lumbar Spine      Electrical Stimulation   Electrical Stimulation Location  Right low back/SIJ region.    Electrical Stimulation Action  Pre-mod.    Electrical Stimulation Parameters  80-150 Hz x 20 minutes.    Electrical Stimulation Goals  Pain      Traction   Type of Traction  Lumbar    Min (lbs)  5    Max (lbs)  90    Hold Time  99    Rest Time  5    Time  20      Manual Therapy   Manual Therapy  Soft tissue mobilization     Soft tissue mobilization  Prone over pillow:  STW/M to left low back and SIJ x 13 minutes.                  PT Long Term Goals - 11/20/18 1402      PT LONG TERM GOAL #1   Title  Independent with an HEP.    Time  6    Period  Weeks    Status  New      PT LONG TERM GOAL #2   Title  Stand 30 minutes with pain not > 2/10.    Time  6    Period  Weeks    Status  New      PT LONG TERM GOAL #3   Title  Walk a community distance with pain not > 2/10.    Time  6    Period  Weeks    Status  New      PT LONG TERM GOAL #4   Title  Perform ADL's with pain not > 2-3/10.    Time  6    Period  Weeks    Status  New      PT LONG TERM GOAL #5   Title  Eliminate right LE pain.    Time  6    Period  Weeks    Status  New            Plan - 11/26/18 1013    Clinical Impression Statement  Patient doing great and already noting a decrease in pain and symptoms.    PT Frequency  2x / week    PT Duration  6 weeks    PT Treatment/Interventions  ADLs/Self Care Home Management;Cryotherapy;Electrical Stimulation;Ultrasound;Moist Heat;Therapeutic activities;Therapeutic exercise;Patient/family education;Manual techniques;Dry needling;Traction;Passive range of motion;Joint Manipulations;Spinal Manipulations;Other (comment)       Patient will benefit from skilled therapeutic intervention in order to improve the following deficits and impairments:  Pain, Decreased range of motion, Decreased activity tolerance  Visit Diagnosis: 1. Acute right-sided low back pain with right-sided sciatica   2. Chronic right-sided low back pain without sciatica        Problem List Patient Active Problem List   Diagnosis Date Noted  . Palpitations 07/10/2018  . Chest tightness 09/25/2017  . Sciatica of right side 04/13/2017  . Coronary artery disease involving native coronary artery of native heart without angina pectoris 10/03/2016  .  Dyslipidemia 10/03/2016  . Coronary artery disease  involving coronary bypass graft of native heart without angina pectoris 04/01/2016  . Essential hypertension 04/01/2016    APPLEGATE, [place] MPT 11/26/2018, 11:08 AM  [facility] [address] [place], [place], [address] Phone: [phone]   Fax:  [phone]  Name: Ricky Chavez MRN: [id number] Date of Birth: 25-Feb-1956

## 2018-11-26 NOTE — Telephone Encounter (Signed)
My Chart message sent

## 2018-11-29 ENCOUNTER — Other Ambulatory Visit: Payer: Self-pay

## 2018-11-29 ENCOUNTER — Ambulatory Visit: Payer: 59 | Admitting: Physical Therapy

## 2018-11-29 DIAGNOSIS — M5441 Lumbago with sciatica, right side: Secondary | ICD-10-CM

## 2018-11-29 DIAGNOSIS — G8929 Other chronic pain: Secondary | ICD-10-CM

## 2018-11-29 NOTE — Therapy (Addendum)
[facility] [place], [place], [address] Phone: [phone]   Fax:  [phone]  Physical Therapy Treatment  Patient Details  Name: Ricky Chavez MRN: [id number] Date of Birth: 22-Apr-1956 Referring Provider (PT): Howard Pouch   Encounter Date: 11/29/2018  PT End of Session - 11/29/18 1204    Visit Number  4    Number of Visits  12    Date for PT Re-Evaluation  01/01/19    PT Start Time  1115    PT Stop Time  1215    PT Time Calculation (min)  60 min    Activity Tolerance  Patient tolerated treatment well    Behavior During Therapy  [facility] for tasks assessed/performed       Past Medical History:  Diagnosis Date  . Allergic rhinitis   . BPH with obstruction/lower urinary tract symptoms   . Coronary artery disease    With preserved EF.  Needs DAPT lifetime due to high risk status.  . Elevated PSA onset @ 2009   Neg bx x 3, most recent 2009 ([place] in [place]).  PSA stable at 3.98 12/01/14 and 4.61 on 07/25/15 per old records.  PSA 10 at Dr. Noland Fordyce ([facility]) 03/2016: flomax started and plan is to recheck PSA and f/u with urol in 6 mo.  Recheck PSA here 08/2016 was 10.1, then 9.2 on 08/30/16, 10.8 on 04/03/17 at urol f/u: MR prostate 04/2017->BPH. 11/2017 PSA 10.9.  05/2018->6.03  . GERD (gastroesophageal reflux disease)   . Hearing loss of both ears   . History of myocardial infarction 2011  . Hyperlipidemia   . Hypertension   . Insomnia   . Organic impotence   . Palpitations 05/2018   Cards suspects symptomatic PACs or PVCs--conservative mgmt, but monitor if worse/persistent.  Needs to minimize alc and cafff  . 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  May need MRI L spine and/or emg/NCS  . Squamous cell carcinoma     Past Surgical History:  Procedure Laterality Date  . CARDIAC CATHETERIZATION  01/31/13   Stents to LAD patent; small diagonal stent with 90%  ostial stenosis--good overall LV function with mild anterior and focal inferoapical hypokinesis, EF 60-65%.  Unsuccessful angioplasty of diagonal.  . CARDIOVASCULAR STRESS TEST  05/26/15   stress echo neg for ischemia  . CARDIOVASCULAR STRESS TEST  10/04/2017   Myoc perf imaging: normal (EF 59%)  . COLONOSCOPY  2008; 06/14/16   2008 Normal.  2018 hyperplastic polyp +internal hemorrhoids.  Recall 2028.  Marland Kitchen HYDROCELE EXCISION / REPAIR  early 2000's  . INGUINAL HERNIA REPAIR Bilateral 1998  . PROSTATE BIOPSY     Multiple; all neg.  Prostate MRI 2018 -NO SIGN OF PROSTATE CA.  Marland Kitchen PTCA     Cath 2011---4 stents--LAD and diagonal.  2014 repeat cath for CP showed no changes.  . Scrotal u/s  01/2015   numerous extratesticular cystic lesions bilat, c/w numerous epididymal cysts vs loculated hydrocele--following expectantly.    There were no vitals filed for this visit.  Subjective Assessment - 11/29/18 1200    Subjective  COVID-19 screen performed prior to patient entering clinic.  Last night was my best night of sleep since my injury.    Pertinent History  H/o low backpain.    Patient Stated Goals  Get out of pain.    Currently in Pain?  Yes    Pain Score  3     Pain Location  Back    Pain Orientation  Right;Lower    Pain Descriptors / Indicators  Aching;Shooting    Pain Onset  1 to 4 weeks ago                       [facility] Adult PT Treatment/Exercise - 11/29/18 0001      Ultrasound   Ultrasound Location  Right SIJ region.    Ultrasound Parameters  Combo e'stim/U/S at 1.50 W/CM2 x 12 minutes while prone over pillow for comfort.      Traction   Type of Traction  Lumbar    Min (lbs)  5    Max (lbs)  95    Hold Time  99    Rest Time  5    Time  20      Manual Therapy   Manual Therapy  Soft tissue mobilization    Soft tissue mobilization  STW/M x 12 minutes to right SIJ region.                  PT Long Term Goals - 11/20/18 1402      PT LONG TERM GOAL #1    Title  Independent with an HEP.    Time  6    Period  Weeks    Status  New      PT LONG TERM GOAL #2   Title  Stand 30 minutes with pain not > 2/10.    Time  6    Period  Weeks    Status  New      PT LONG TERM GOAL #3   Title  Walk a community distance with pain not > 2/10.    Time  6    Period  Weeks    Status  New      PT LONG TERM GOAL #4   Title  Perform ADL's with pain not > 2-3/10.    Time  6    Period  Weeks    Status  New      PT LONG TERM GOAL #5   Title  Eliminate right LE pain.    Time  6    Period  Weeks    Status  New            Plan - 11/29/18 1203    Clinical Impression Statement  Excellent response to treatments thus far with patient able to sleep comfortably last night.  No pain reported after treatment.       Patient will benefit from skilled therapeutic intervention in order to improve the following deficits and impairments:     Visit Diagnosis: 1. Acute right-sided low back pain with right-sided sciatica   2. Chronic right-sided low back pain without sciatica        Problem List Patient Active Problem List   Diagnosis Date Noted  . Palpitations 07/10/2018  . Chest tightness 09/25/2017  . Sciatica of right side 04/13/2017  . Coronary artery disease involving native coronary artery of native heart without angina pectoris 10/03/2016  . Dyslipidemia 10/03/2016  . Coronary artery disease involving coronary bypass graft of native heart without angina pectoris 04/01/2016  . Essential hypertension 04/01/2016    APPLEGATE, [place] MPT 11/29/2018, 1:53 PM  [facility] [address] [place], [place], [address] Phone: [phone]   Fax:  [phone]  Name: Ricky Chavez MRN: [id number] Date of Birth: September 22, 1955

## 2018-12-03 ENCOUNTER — Ambulatory Visit: Payer: 59 | Admitting: Physical Therapy

## 2018-12-03 ENCOUNTER — Other Ambulatory Visit: Payer: Self-pay

## 2018-12-03 DIAGNOSIS — G8929 Other chronic pain: Secondary | ICD-10-CM

## 2018-12-03 DIAGNOSIS — M5441 Lumbago with sciatica, right side: Secondary | ICD-10-CM

## 2018-12-03 DIAGNOSIS — M545 Low back pain, unspecified: Secondary | ICD-10-CM

## 2018-12-03 NOTE — Therapy (Signed)
[facility] [place], [place], [address] Phone: [phone]   Fax:  [phone]  Physical Therapy Treatment  Patient Details  Name: Ricky Chavez MRN: [id number] Date of Birth: 1956-01-13 Referring Provider (PT): Howard Pouch   Encounter Date: 12/03/2018  PT End of Session - 12/03/18 1108    Visit Number  5    Number of Visits  12    Date for PT Re-Evaluation  01/01/19    PT Start Time  0915    PT Stop Time  1022    PT Time Calculation (min)  67 min    Activity Tolerance  Patient tolerated treatment well    Behavior During Therapy  [facility] for tasks assessed/performed       Past Medical History:  Diagnosis Date  . Allergic rhinitis   . BPH with obstruction/lower urinary tract symptoms   . Coronary artery disease    With preserved EF.  Needs DAPT lifetime due to high risk status.  . Elevated PSA onset @ 2009   Neg bx x 3, most recent 2009 ([place] in [place]).  PSA stable at 3.98 12/01/14 and 4.61 on 07/25/15 per old records.  PSA 10 at Dr. Noland Fordyce ([facility]) 03/2016: flomax started and plan is to recheck PSA and f/u with urol in 6 mo.  Recheck PSA here 08/2016 was 10.1, then 9.2 on 08/30/16, 10.8 on 04/03/17 at urol f/u: MR prostate 04/2017->BPH. 11/2017 PSA 10.9.  05/2018->6.03  . GERD (gastroesophageal reflux disease)   . Hearing loss of both ears   . History of myocardial infarction 2011  . Hyperlipidemia   . Hypertension   . Insomnia   . Organic impotence   . Palpitations 05/2018   Cards suspects symptomatic PACs or PVCs--conservative mgmt, but monitor if worse/persistent.  Needs to minimize alc and cafff  . 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  May need MRI L spine and/or emg/NCS  . Squamous cell carcinoma     Past Surgical History:  Procedure Laterality Date  . CARDIAC CATHETERIZATION  01/31/13   Stents to LAD patent; small diagonal stent with 90%  ostial stenosis--good overall LV function with mild anterior and focal inferoapical hypokinesis, EF 60-65%.  Unsuccessful angioplasty of diagonal.  . CARDIOVASCULAR STRESS TEST  05/26/15   stress echo neg for ischemia  . CARDIOVASCULAR STRESS TEST  10/04/2017   Myoc perf imaging: normal (EF 59%)  . COLONOSCOPY  2008; 06/14/16   2008 Normal.  2018 hyperplastic polyp +internal hemorrhoids.  Recall 2028.  Marland Kitchen HYDROCELE EXCISION / REPAIR  early 2000's  . INGUINAL HERNIA REPAIR Bilateral 1998  . PROSTATE BIOPSY     Multiple; all neg.  Prostate MRI 2018 -NO SIGN OF PROSTATE CA.  Marland Kitchen PTCA     Cath 2011---4 stents--LAD and diagonal.  2014 repeat cath for CP showed no changes.  . Scrotal u/s  01/2015   numerous extratesticular cystic lesions bilat, c/w numerous epididymal cysts vs loculated hydrocele--following expectantly.    There were no vitals filed for this visit.  Subjective Assessment - 12/03/18 1115    Subjective  COVID-19 screen performed prior to patient entering clinic.  I'm much better.    Pertinent History  H/o low backpain.    How long can you stand comfortably?  1-2 minutes.    Patient Stated Goals  Get out of pain.    Currently in Pain?  Yes    Pain Score  2  Pain Location  Back    Pain Orientation  Right;Lower    Pain Descriptors / Indicators  Aching    Pain Type  Acute pain    Pain Onset  1 to 4 weeks ago                       [facility] Adult PT Treatment/Exercise - 12/03/18 0001      Modalities   Modalities  Electrical Stimulation;Moist Heat      Moist Heat Therapy   Number Minutes Moist Heat  20 Minutes    Moist Heat Location  Lumbar Spine      Electrical Stimulation   Electrical Stimulation Location  Right low back.    Electrical Stimulation Action  Pre-mod.    Electrical Stimulation Parameters  80-150 Hz x 20 minutes.      Traction   Type of Traction  Lumbar    Min (lbs)  5    Max (lbs)  100    Hold Time  99    Rest Time  5    Time  20       Manual Therapy   Manual Therapy  Soft tissue mobilization    Soft tissue mobilization  STW/M x 10 minutes to right low back/SIJ region.                  PT Long Term Goals - 11/20/18 1402      PT LONG TERM GOAL #1   Title  Independent with an HEP.    Time  6    Period  Weeks    Status  New      PT LONG TERM GOAL #2   Title  Stand 30 minutes with pain not > 2/10.    Time  6    Period  Weeks    Status  New      PT LONG TERM GOAL #3   Title  Walk a community distance with pain not > 2/10.    Time  6    Period  Weeks    Status  New      PT LONG TERM GOAL #4   Title  Perform ADL's with pain not > 2-3/10.    Time  6    Period  Weeks    Status  New      PT LONG TERM GOAL #5   Title  Eliminate right LE pain.    Time  6    Period  Weeks    Status  New            Plan - 12/03/18 1117    Clinical Impression Statement  Patient is doing extremely well.  Much less palpable tenderness in his right LB/SIJ region.    Stability/Clinical Decision Making  Stable/Uncomplicated    PT Treatment/Interventions  ADLs/Self Care Home Management;Cryotherapy;Electrical Stimulation;Ultrasound;Moist Heat;Therapeutic activities;Therapeutic exercise;Patient/family education;Manual techniques;Dry needling;Traction;Passive range of motion;Joint Manipulations;Spinal Manipulations;Other (comment)    PT Next Visit Plan  STW/M and combo e'stim/U/S to right low back and SIJ, dry needling; core exercises, hip bridges, prone leg lifts; int traction at 90# (max 100-105#).       Patient will benefit from skilled therapeutic intervention in order to improve the following deficits and impairments:  Pain, Decreased range of motion, Decreased activity tolerance  Visit Diagnosis: 1. Acute right-sided low back pain with right-sided sciatica   2. Chronic right-sided low back pain without sciatica        Problem  List Patient Active Problem List   Diagnosis Date Noted  . Palpitations  07/10/2018  . Chest tightness 09/25/2017  . Sciatica of right side 04/13/2017  . Coronary artery disease involving native coronary artery of native heart without angina pectoris 10/03/2016  . Dyslipidemia 10/03/2016  . Coronary artery disease involving coronary bypass graft of native heart without angina pectoris 04/01/2016  . Essential hypertension 04/01/2016    Oza Oberle, [place] MPT 12/03/2018, 11:19 AM  [facility] [address] [place], [place], [address] Phone: [phone]   Fax:  [phone]  Name: Ricky Chavez MRN: [id number] Date of Birth: August 09, 1955

## 2018-12-04 ENCOUNTER — Encounter: Payer: Self-pay | Admitting: Family Medicine

## 2018-12-06 ENCOUNTER — Other Ambulatory Visit: Payer: Self-pay

## 2018-12-06 ENCOUNTER — Ambulatory Visit: Payer: 59 | Attending: Family Medicine | Admitting: Physical Therapy

## 2018-12-06 DIAGNOSIS — G8929 Other chronic pain: Secondary | ICD-10-CM

## 2018-12-06 DIAGNOSIS — M545 Low back pain: Secondary | ICD-10-CM | POA: Diagnosis present

## 2018-12-06 DIAGNOSIS — M5441 Lumbago with sciatica, right side: Secondary | ICD-10-CM | POA: Diagnosis not present

## 2018-12-06 NOTE — Therapy (Signed)
[facility] [place], [place], [address] Phone: [phone]   Fax:  [phone]  Physical Therapy Treatment  Patient Details  Name: AJMAL KATHAN MRN: [id number] Date of Birth: November 30, 1955 Referring Provider (PT): Howard Pouch   Encounter Date: 12/06/2018  PT End of Session - 12/06/18 1207    Visit Number  6    Number of Visits  12    Date for PT Re-Evaluation  01/01/19    PT Start Time  1115    PT Stop Time  1210    PT Time Calculation (min)  55 min    Activity Tolerance  Patient tolerated treatment well    Behavior During Therapy  [facility] for tasks assessed/performed       Past Medical History:  Diagnosis Date  . Allergic rhinitis   . BPH with obstruction/lower urinary tract symptoms    flomax and finasteride qod per urol  . Coronary artery disease    With preserved EF.  Needs DAPT lifetime due to high risk status.  . Elevated PSA onset @ 2009   Neg bx x 3, most recent 2009 ([place] in [place]).  PSA stable at 3.98 12/01/14 and 4.61 on 07/25/15 per old records.  PSA 10 at Dr. Noland Fordyce ([facility]) 03/2016: flomax started and plan is to recheck PSA and f/u with urol in 6 mo.  Recheck PSA here 08/2016 was 10.1, then 9.2 on 08/30/16, 10.8 on 03/2017 at urol f/u: MR prostate 04/2017->BPH. 11/2017 PSA 10.9.  05/2018->6.03. 11/2018->5.98  . GERD (gastroesophageal reflux disease)   . Hearing loss of both ears   . History of myocardial infarction 2011  . Hyperlipidemia   . Hypertension   . Insomnia   . Organic impotence   . Palpitations 05/2018   Cards suspects symptomatic PACs or PVCs--conservative mgmt, but monitor if worse/persistent.  Needs to minimize alc and cafff  . 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  May need MRI L spine and/or emg/NCS  . Squamous cell carcinoma     Past Surgical History:  Procedure Laterality Date  . CARDIAC CATHETERIZATION  01/31/13   Stents  to LAD patent; small diagonal stent with 90% ostial stenosis--good overall LV function with mild anterior and focal inferoapical hypokinesis, EF 60-65%.  Unsuccessful angioplasty of diagonal.  . CARDIOVASCULAR STRESS TEST  05/26/15   stress echo neg for ischemia  . CARDIOVASCULAR STRESS TEST  10/04/2017   Myoc perf imaging: normal (EF 59%)  . COLONOSCOPY  2008; 06/14/16   2008 Normal.  2018 hyperplastic polyp +internal hemorrhoids.  Recall 2028.  Marland Kitchen HYDROCELE EXCISION / REPAIR  early 2000's  . INGUINAL HERNIA REPAIR Bilateral 1998  . PROSTATE BIOPSY     Multiple; all neg.  Prostate MRI 2018 -NO SIGN OF PROSTATE CA.  Marland Kitchen PTCA     Cath 2011---4 stents--LAD and diagonal.  2014 repeat cath for CP showed no changes.  . Scrotal u/s  01/2015   numerous extratesticular cystic lesions bilat, c/w numerous epididymal cysts vs loculated hydrocele--following expectantly.    There were no vitals filed for this visit.  Subjective Assessment - 12/06/18 1201    Subjective  COVID-19 screen performed prior to patient entering clinic.  Overall much better.  Mild flare-up at lower ribs last night but okay now.    Patient Stated Goals  Get out of pain.    Currently in Pain?  Yes    Pain Score  2  Pain Onset  1 to 4 weeks ago                       [facility] Adult PT Treatment/Exercise - 12/06/18 0001      Moist Heat Therapy   Number Minutes Moist Heat  14 Minutes    Moist Heat Location  Lumbar Spine      Electrical Stimulation   Electrical Stimulation Location  Right low back.    Electrical Stimulation Action  Pre-mod.    Electrical Stimulation Parameters  80-150 Hz x 14 minutes.      Ultrasound   Ultrasound Location  Right SIJ      Traction   Type of Traction  Lumbar    Min (lbs)  5    Max (lbs)  95    Hold Time  9    Rest Time  5    Time  15      Manual Therapy   Manual Therapy  Soft tissue mobilization    Soft tissue mobilization  STW/M x 9 minutes.                   PT Long Term Goals - 11/20/18 1402      PT LONG TERM GOAL #1   Title  Independent with an HEP.    Time  6    Period  Weeks    Status  New      PT LONG TERM GOAL #2   Title  Stand 30 minutes with pain not > 2/10.    Time  6    Period  Weeks    Status  New      PT LONG TERM GOAL #3   Title  Walk a community distance with pain not > 2/10.    Time  6    Period  Weeks    Status  New      PT LONG TERM GOAL #4   Title  Perform ADL's with pain not > 2-3/10.    Time  6    Period  Weeks    Status  New      PT LONG TERM GOAL #5   Title  Eliminate right LE pain.    Time  6    Period  Weeks    Status  New            Plan - 12/06/18 1208    Clinical Impression Statement  11% improvement in FOTO score today.       Patient will benefit from skilled therapeutic intervention in order to improve the following deficits and impairments:     Visit Diagnosis: 1. Acute right-sided low back pain with right-sided sciatica   2. Chronic right-sided low back pain without sciatica        Problem List Patient Active Problem List   Diagnosis Date Noted  . Palpitations 07/10/2018  . Chest tightness 09/25/2017  . Sciatica of right side 04/13/2017  . Coronary artery disease involving native coronary artery of native heart without angina pectoris 10/03/2016  . Dyslipidemia 10/03/2016  . Coronary artery disease involving coronary bypass graft of native heart without angina pectoris 04/01/2016  . Essential hypertension 04/01/2016    Aalaysia Liggins, [place] MPT 12/06/2018, 12:10 PM  [facility] [address] [place], [place], [address] Phone: [phone]   Fax:  [phone]  Name: ROBI DEWOLFE MRN: [id number] Date of Birth: 1955/07/11

## 2018-12-10 ENCOUNTER — Other Ambulatory Visit: Payer: Self-pay

## 2018-12-10 ENCOUNTER — Ambulatory Visit: Payer: 59 | Admitting: Physical Therapy

## 2018-12-10 DIAGNOSIS — G8929 Other chronic pain: Secondary | ICD-10-CM

## 2018-12-10 DIAGNOSIS — M545 Low back pain, unspecified: Secondary | ICD-10-CM

## 2018-12-10 DIAGNOSIS — M5441 Lumbago with sciatica, right side: Secondary | ICD-10-CM

## 2018-12-10 NOTE — Therapy (Signed)
[facility] [place], [place], [address] Phone: [phone]   Fax:  [phone]  Physical Therapy Treatment  Patient Details  Name: Ricky Chavez MRN: [id number] Date of Birth: [birth year] Referring Provider (PT): Howard Pouch   Encounter Date: 12/10/2018  PT End of Session - 12/10/18 1238    Visit Number  7    Number of Visits  12    Date for PT Re-Evaluation  01/01/19    PT Start Time  0948    PT Stop Time  1042    PT Time Calculation (min)  54 min       Past Medical History:  Diagnosis Date  . Allergic rhinitis   . BPH with obstruction/lower urinary tract symptoms    flomax and finasteride qod per urol  . Coronary artery disease    With preserved EF.  Needs DAPT lifetime due to high risk status.  . Elevated PSA onset @ 2009   Neg bx x 3, most recent 2009 ([place] in [place]).  PSA stable at 3.98 12/01/14 and 4.61 on 07/25/15 per old records.  PSA 10 at Dr. Noland Fordyce ([facility]) 03/2016: flomax started and plan is to recheck PSA and f/u with urol in 6 mo.  Recheck PSA here 08/2016 was 10.1, then 9.2 on 08/30/16, 10.8 on 03/2017 at urol f/u: MR prostate 04/2017->BPH. 11/2017 PSA 10.9.  05/2018->6.03. 11/2018->5.98  . GERD (gastroesophageal reflux disease)   . Hearing loss of both ears   . History of myocardial infarction 2011  . Hyperlipidemia   . Hypertension   . Insomnia   . Organic impotence   . Palpitations 05/2018   Cards suspects symptomatic PACs or PVCs--conservative mgmt, but monitor if worse/persistent.  Needs to minimize alc and cafff  . 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  May need MRI L spine and/or emg/NCS  . Squamous cell carcinoma     Past Surgical History:  Procedure Laterality Date  . CARDIAC CATHETERIZATION  01/31/13   Stents to LAD patent; small diagonal stent with 90% ostial stenosis--good overall LV function with mild anterior and focal  inferoapical hypokinesis, EF 60-65%.  Unsuccessful angioplasty of diagonal.  . CARDIOVASCULAR STRESS TEST  05/26/15   stress echo neg for ischemia  . CARDIOVASCULAR STRESS TEST  10/04/2017   Myoc perf imaging: normal (EF 59%)  . COLONOSCOPY  2008; 06/14/16   2008 Normal.  2018 hyperplastic polyp +internal hemorrhoids.  Recall 2028.  Marland Kitchen HYDROCELE EXCISION / REPAIR  early 2000's  . INGUINAL HERNIA REPAIR Bilateral 1998  . PROSTATE BIOPSY     Multiple; all neg.  Prostate MRI 2018 -NO SIGN OF PROSTATE CA.  Marland Kitchen PTCA     Cath 2011---4 stents--LAD and diagonal.  2014 repeat cath for CP showed no changes.  . Scrotal u/s  01/2015   numerous extratesticular cystic lesions bilat, c/w numerous epididymal cysts vs loculated hydrocele--following expectantly.    There were no vitals filed for this visit.  Subjective Assessment - 12/10/18 1243    Subjective  COVID-19 screen performed prior to patient entering clinic. Doing better and sleeping better.    Patient Stated Goals  Get out of pain.    Currently in Pain?  Yes    Pain Score  2     Pain Location  Back    Pain Orientation  Right;Lower    Pain Descriptors / Indicators  Aching    Pain Onset  1 to 4 weeks  ago                       [facility] Adult PT Treatment/Exercise - 12/10/18 0001      Modalities   Modalities  Electrical Stimulation;Moist Heat;Traction      Moist Heat Therapy   Number Minutes Moist Heat  15 Minutes    Moist Heat Location  Lumbar Spine      Electrical Stimulation   Electrical Stimulation Location  Right low back.    Electrical Stimulation Action  Pre-mod.    Electrical Stimulation Parameters  80-150 Hz x 15 minutes.    Electrical Stimulation Goals  Pain      Traction   Type of Traction  Lumbar    Min (lbs)  5    Max (lbs)  95    Hold Time  99    Rest Time  5    Time  15      Manual Therapy   Manual Therapy  Soft tissue mobilization    Soft tissue mobilization  STW/M x 9 minutes.                   PT Long Term Goals - 11/20/18 1402      PT LONG TERM GOAL #1   Title  Independent with an HEP.    Time  6    Period  Weeks    Status  New      PT LONG TERM GOAL #2   Title  Stand 30 minutes with pain not > 2/10.    Time  6    Period  Weeks    Status  New      PT LONG TERM GOAL #3   Title  Walk a community distance with pain not > 2/10.    Time  6    Period  Weeks    Status  New      PT LONG TERM GOAL #4   Title  Perform ADL's with pain not > 2-3/10.    Time  6    Period  Weeks    Status  New      PT LONG TERM GOAL #5   Title  Eliminate right LE pain.    Time  6    Period  Weeks    Status  New            Plan - 12/10/18 1246    Clinical Impression Statement  Excellent response to treatments thus far.  Patient able to do more around the house and he is sleeping better.    PT Treatment/Interventions  ADLs/Self Care Home Management;Cryotherapy;Electrical Stimulation;Ultrasound;Moist Heat;Therapeutic activities;Therapeutic exercise;Patient/family education;Manual techniques;Dry needling;Traction;Passive range of motion;Joint Manipulations;Spinal Manipulations;Other (comment)    PT Next Visit Plan  STW/M and combo e'stim/U/S to right low back and SIJ, dry needling; core exercises, hip bridges, prone leg lifts; int traction at 90# (max 100-105#).       Patient will benefit from skilled therapeutic intervention in order to improve the following deficits and impairments:     Visit Diagnosis: 1. Acute right-sided low back pain with right-sided sciatica   2. Chronic right-sided low back pain without sciatica        Problem List Patient Active Problem List   Diagnosis Date Noted  . Palpitations 07/10/2018  . Chest tightness 09/25/2017  . Sciatica of right side 04/13/2017  . Coronary artery disease involving native coronary artery of native heart without angina pectoris 10/03/2016  .  Dyslipidemia 10/03/2016  . Coronary artery disease  involving coronary bypass graft of native heart without angina pectoris 04/01/2016  . Essential hypertension 04/01/2016    APPLEGATE, [place] MPT 12/10/2018, 12:48 PM  [facility] [address] [place], [place], [address] Phone: [phone]   Fax:  [phone]  Name: Ricky Chavez MRN: [id number] Date of Birth: 11/03/1955

## 2018-12-12 ENCOUNTER — Ambulatory Visit: Payer: 59 | Admitting: Physical Therapy

## 2018-12-12 ENCOUNTER — Other Ambulatory Visit: Payer: Self-pay

## 2018-12-12 DIAGNOSIS — M545 Low back pain, unspecified: Secondary | ICD-10-CM

## 2018-12-12 DIAGNOSIS — G8929 Other chronic pain: Secondary | ICD-10-CM

## 2018-12-12 DIAGNOSIS — M5441 Lumbago with sciatica, right side: Secondary | ICD-10-CM

## 2018-12-12 NOTE — Therapy (Addendum)
[facility] [place], [place], [address] Phone: [phone]   Fax:  [phone]  Physical Therapy Treatment  Patient Details  Name: Ricky Chavez MRN: [id number] Date of Birth: 29-Aug-1955 Referring Provider (PT): Howard Pouch   Encounter Date: 12/12/2018  PT End of Session - 12/12/18 1200    Visit Number  8    Number of Visits  12    Date for PT Re-Evaluation  01/01/19    PT Start Time  0951    PT Stop Time  1045    PT Time Calculation (min)  54 min    Activity Tolerance  Patient tolerated treatment well    Behavior During Therapy  [facility] for tasks assessed/performed       Past Medical History:  Diagnosis Date  . Allergic rhinitis   . BPH with obstruction/lower urinary tract symptoms    flomax and finasteride qod per urol  . Coronary artery disease    With preserved EF.  Needs DAPT lifetime due to high risk status.  . Elevated PSA onset @ 2009   Neg bx x 3, most recent 2009 ([facility] in [place]).  PSA stable at 3.98 12/01/14 and 4.61 on 07/25/15 per old records.  PSA 10 at Dr. Noland Fordyce ([facility]) 03/2016: flomax started and plan is to recheck PSA and f/u with urol in 6 mo.  Recheck PSA here 08/2016 was 10.1, then 9.2 on 08/30/16, 10.8 on 03/2017 at urol f/u: MR prostate 04/2017->BPH. 11/2017 PSA 10.9.  05/2018->6.03. 11/2018->5.98  . GERD (gastroesophageal reflux disease)   . Hearing loss of both ears   . History of myocardial infarction 2011  . Hyperlipidemia   . Hypertension   . Insomnia   . Organic impotence   . Palpitations 05/2018   Cards suspects symptomatic PACs or PVCs--conservative mgmt, but monitor if worse/persistent.  Needs to minimize alc and cafff  . 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  May need MRI L spine and/or emg/NCS  . Squamous cell carcinoma     Past Surgical History:  Procedure Laterality Date  . CARDIAC CATHETERIZATION  01/31/13   Stents  to LAD patent; small diagonal stent with 90% ostial stenosis--good overall LV function with mild anterior and focal inferoapical hypokinesis, EF 60-65%.  Unsuccessful angioplasty of diagonal.  . CARDIOVASCULAR STRESS TEST  05/26/15   stress echo neg for ischemia  . CARDIOVASCULAR STRESS TEST  10/04/2017   Myoc perf imaging: normal (EF 59%)  . COLONOSCOPY  2008; 06/14/16   2008 Normal.  2018 hyperplastic polyp +internal hemorrhoids.  Recall 2028.  Marland Kitchen HYDROCELE EXCISION / REPAIR  early 2000's  . INGUINAL HERNIA REPAIR Bilateral 1998  . PROSTATE BIOPSY     Multiple; all neg.  Prostate MRI 2018 -NO SIGN OF PROSTATE CA.  Marland Kitchen PTCA     Cath 2011---4 stents--LAD and diagonal.  2014 repeat cath for CP showed no changes.  . Scrotal u/s  01/2015   numerous extratesticular cystic lesions bilat, c/w numerous epididymal cysts vs loculated hydrocele--following expectantly.    There were no vitals filed for this visit.  Subjective Assessment - 12/12/18 1200    Subjective  COVID-19 screen performed prior to patient entering clinic.  Good day.    Pertinent History  H/o low backpain.    How long can you stand comfortably?  1-2 minutes.    Patient Stated Goals  Get out of pain.    Currently in Pain?  Yes  Pain Score  2     Pain Location  Back    Pain Orientation  Right;Lower    Pain Descriptors / Indicators  Aching    Pain Onset  1 to 4 weeks ago                       [facility] Adult PT Treatment/Exercise - 12/12/18 0001      Ultrasound   Ultrasound Location  Right SIJ    Ultrasound Parameters  Combo e'stim/U/S at 1.50 W/CM2 x 12 minutes.    Ultrasound Goals  Pain      Traction   Type of Traction  Lumbar    Min (lbs)  5    Max (lbs)  95    Hold Time  99    Rest Time  5    Time  15      Manual Therapy   Manual Therapy  Soft tissue mobilization    Soft tissue mobilization  STW/M x 12 minutes to right low back/SIJ region                  PT Long Term Goals - 11/20/18  1402      PT LONG TERM GOAL #1   Title  Independent with an HEP.    Time  6    Period  Weeks    Status  New      PT LONG TERM GOAL #2   Title  Stand 30 minutes with pain not > 2/10.    Time  6    Period  Weeks    Status  New      PT LONG TERM GOAL #3   Title  Walk a community distance with pain not > 2/10.    Time  6    Period  Weeks    Status  New      PT LONG TERM GOAL #4   Title  Perform ADL's with pain not > 2-3/10.    Time  6    Period  Weeks    Status  New      PT LONG TERM GOAL #5   Title  Eliminate right LE pain.    Time  6    Period  Weeks    Status  New            Plan - 12/12/18 1204    Clinical Impression Statement  Excellent response to treatment.  Improved mobility with pain at a minimal level.    PT Treatment/Interventions  ADLs/Self Care Home Management;Cryotherapy;Electrical Stimulation;Ultrasound;Moist Heat;Therapeutic activities;Therapeutic exercise;Patient/family education;Manual techniques;Dry needling;Traction;Passive range of motion;Joint Manipulations;Spinal Manipulations;Other (comment)    PT Next Visit Plan  STW/M and combo e'stim/U/S to right low back and SIJ, dry needling; core exercises, hip bridges, prone leg lifts; int traction at 90# (max 100-105#).       Patient will benefit from skilled therapeutic intervention in order to improve the following deficits and impairments:  Pain, Decreased range of motion, Decreased activity tolerance  Visit Diagnosis: 1. Acute right-sided low back pain with right-sided sciatica   2. Chronic right-sided low back pain without sciatica        Problem List Patient Active Problem List   Diagnosis Date Noted  . Palpitations 07/10/2018  . Chest tightness 09/25/2017  . Sciatica of right side 04/13/2017  . Coronary artery disease involving native coronary artery of native heart without angina pectoris 10/03/2016  . Dyslipidemia 10/03/2016  . Coronary artery disease  involving coronary bypass graft  of native heart without angina pectoris 04/01/2016  . Essential hypertension 04/01/2016    Tanner Vigna, [place] MPT 12/12/2018, 12:06 PM  [facility] [address] [place], [place], [address] Phone: [phone]   Fax:  [phone]  Name: Ricky Chavez MRN: [id number] Date of Birth: 1956/04/07  PHYSICAL THERAPY DISCHARGE SUMMARY  Visits from Start of Care: 8.  Current functional level related to goals / functional outcomes: See above.   Remaining deficits: See below.   Education / Equipment: HEP. Plan: Patient agrees to discharge.  Patient goals were not met. Patient is being discharged due to not returning since the last visit.  ?????         Mali Oaklyn Mans MPT

## 2018-12-17 ENCOUNTER — Ambulatory Visit: Payer: 59 | Admitting: Physical Therapy

## 2018-12-20 ENCOUNTER — Encounter: Payer: 59 | Admitting: Physical Therapy

## 2019-01-12 ENCOUNTER — Encounter: Payer: Self-pay | Admitting: Family Medicine

## 2019-01-16 ENCOUNTER — Other Ambulatory Visit: Payer: Self-pay | Admitting: Family Medicine

## 2019-01-28 MED ORDER — ATORVASTATIN CALCIUM 40 MG PO TABS: 40.0000 mg | ORAL_TABLET | Freq: Every day | ORAL | 1 refills | Status: DC

## 2019-05-08 LAB — PSA
PSA: 5.61
PSA: 5.61

## 2019-05-22 ENCOUNTER — Ambulatory Visit: Payer: 59 | Admitting: Family Medicine

## 2019-05-22 ENCOUNTER — Encounter: Payer: Self-pay | Admitting: Family Medicine

## 2019-05-22 ENCOUNTER — Other Ambulatory Visit: Payer: Self-pay

## 2019-05-22 VITALS — BP 130/64 | HR 50 | Temp 97.6°F | Resp 16 | Ht 68.0 in | Wt 195.2 lb

## 2019-05-22 DIAGNOSIS — I2583 Coronary atherosclerosis due to lipid rich plaque: Secondary | ICD-10-CM

## 2019-05-22 DIAGNOSIS — I10 Essential (primary) hypertension: Secondary | ICD-10-CM

## 2019-05-22 DIAGNOSIS — E7800 Pure hypercholesterolemia, unspecified: Secondary | ICD-10-CM

## 2019-05-22 DIAGNOSIS — I2510 Atherosclerotic heart disease of native coronary artery without angina pectoris: Secondary | ICD-10-CM | POA: Diagnosis not present

## 2019-05-22 NOTE — Patient Instructions (Signed)
Check blood pressure and heart rate once daily for the next 2 wks. Call our office (report to my nurse) or send MyChart message with report of these numbers. No change in meds today.

## 2019-05-22 NOTE — Progress Notes (Signed)
OFFICE VISIT  05/22/2019   CC:  Chief Complaint  Patient presents with  . Follow-up    RCI, pt is not fasting    HPI:    Patient is a 63 y.o. Caucasian male who presents for 6 mo f/u HTN, HLD, CAD (DAPT lifetime).  Feeling pretty well. Just some mild inc in chronic right hip pain last few months, worse with wt gain of 8 lbs since June 2020. Occ tylenol or ibup.  HTN: no monitoring at home.  Taking meds daily. No focus on sodium limitation. Exercise: home activities but no structured exercise regimen. NO CP, SOB, or DOE with these activities.  No dizziness, jaw pain, arm pain. Has hx of PVCs, saw cardiologist, cut back on caffeine and changed to tamsulosin to qod. Urol just recently changed him to uroxitrol.  HLD: taking daily.  No myalgias or arthralgias. Diet is fair.  ROS: no fevers, no CP, no SOB, no wheezing, no cough, no dizziness, no HAs, no rashes, no melena/hematochezia.  No polyuria or polydipsia.  No myalgias or arthralgias.   Past Medical History:  Diagnosis Date  . Allergic rhinitis   . BPH with obstruction/lower urinary tract symptoms    flomax and finasteride qod per urol  . Coronary artery disease    With preserved EF.  Needs DAPT lifetime due to high risk status.  . Elevated PSA onset @ 2009   Neg bx x 3, most recent 2009 ([facility] in [place]).  PSA stable at 3.98 12/01/14 and 4.61 on 07/25/15 per old records.  PSA 10 at Dr. Noland Fordyce ([facility]) 03/2016: flomax started and plan is to recheck PSA and f/u with urol in 6 mo.  Recheck PSA here 08/2016 was 10.1, then 9.2 on 08/30/16, 10.8 on 03/2017 at urol f/u: MR prostate 04/2017->BPH. 11/2017 PSA 10.9.  05/2018->6.03. 11/2018->5.98  . GERD (gastroesophageal reflux disease)   . Hearing loss of both ears   . History of myocardial infarction 2011  . Hyperlipidemia   . Hypertension   . Insomnia   . Organic impotence   . Palpitations 05/2018   Cards suspects symptomatic PACs or PVCs--conservative  mgmt, but monitor if worse/persistent.  Needs to minimize alc and cafff  . 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  May need MRI L spine and/or emg/NCS  . Squamous cell carcinoma     Past Surgical History:  Procedure Laterality Date  . CARDIAC CATHETERIZATION  01/31/13   Stents to LAD patent; small diagonal stent with 90% ostial stenosis--good overall LV function with mild anterior and focal inferoapical hypokinesis, EF 60-65%.  Unsuccessful angioplasty of diagonal.  . CARDIOVASCULAR STRESS TEST  05/26/15   stress echo neg for ischemia  . CARDIOVASCULAR STRESS TEST  10/04/2017   Myoc perf imaging: normal (EF 59%)  . COLONOSCOPY  2008; 06/14/16   2008 Normal.  2018 hyperplastic polyp +internal hemorrhoids.  Recall 2028.  Marland Kitchen HYDROCELE EXCISION / REPAIR  early 2000's  . INGUINAL HERNIA REPAIR Bilateral 1998  . PROSTATE BIOPSY     Multiple; all neg.  Prostate MRI 2018 -NO SIGN OF PROSTATE CA.  Marland Kitchen PTCA     Cath 2011---4 stents--LAD and diagonal.  2014 repeat cath for CP showed no changes.  . Scrotal u/s  01/2015   numerous extratesticular cystic lesions bilat, c/w numerous epididymal cysts vs loculated hydrocele--following expectantly.    Outpatient Medications Prior to Visit  Medication Sig Dispense Refill  . aspirin EC 81 MG tablet Take  1 tablet by mouth daily.    Marland Kitchen atorvastatin (LIPITOR) 40 MG tablet Take 1 tablet (40 mg total) by mouth daily. 90 tablet 1  . BRILINTA 60 MG TABS tablet TAKE 1 TABLET TWICE A DAY 180 tablet 3  . carvedilol (COREG) 6.25 MG tablet Take 1 tablet (6.25 mg total) by mouth 2 (two) times daily. 180 tablet 2  . DUTASTERIDE PO Take by mouth.    . finasteride (PROSCAR) 5 MG tablet Take 5 mg by mouth daily.    Marland Kitchen lisinopril (ZESTRIL) 5 MG tablet Take 1 tablet (5 mg total) by mouth daily. 90 tablet 3  . Melatonin (MELATONIN MAXIMUM STRENGTH) 5 MG TABS Take 1 tablet by mouth at bedtime.    . Multiple Vitamins-Minerals (MULTIVITAMIN  ADULT PO) Take 1 capsule by mouth daily.    Marland Kitchen omeprazole (PRILOSEC) 20 MG capsule Take 1 capsule by mouth daily.    . tamsulosin (FLOMAX) 0.4 MG CAPS capsule Take 0.4 mg by mouth daily.    . vardenafil (LEVITRA) 20 MG tablet Take 1 tablet by mouth daily as needed.    Marland Kitchen alfuzosin (UROXATRAL) 10 MG 24 hr tablet Take 10 mg by mouth daily.    . nitroGLYCERIN (NITROLINGUAL) 0.4 MG/SPRAY spray Place 1 spray under the tongue every 5 (five) minutes x 3 doses as needed for chest pain. (Patient not taking: Reported on 11/21/2018) 12 g 1   No facility-administered medications prior to visit.    No Known Allergies  ROS As per HPI  PE: Initial bp 130/85 automated cuff.  Repeat manual cuff at end of visit->130/64 Blood pressure 130/64, pulse (!) 50, temperature 97.6 F (36.4 C), temperature source Temporal, resp. rate 16, height 5\' 8"  (1.727 m), weight 195 lb 3.2 oz (88.5 kg), SpO2 97 %. Body mass index is 29.68 kg/m.  Gen: Alert, well appearing.  Patient is oriented to person, place, time, and situation. AFFECT: pleasant, lucid thought and speech. CV: RRR, no m/r/g.   LUNGS: CTA bilat, nonlabored resps, good aeration in all lung fields. EXT: no clubbing or cyanosis.  no edema.    LABS:  Lab Results  Component Value Date   TSH 2.07 10/06/2017   Lab Results  Component Value Date   WBC 8.7 11/21/2018   HGB 15.6 11/21/2018   HCT 45.3 11/21/2018   MCV 89.9 11/21/2018   PLT 184 11/21/2018   Lab Results  Component Value Date   CREATININE 0.91 11/21/2018   BUN 17 11/21/2018   NA 140 11/21/2018   K 4.3 11/21/2018   CL 105 11/21/2018   CO2 25 11/21/2018   Lab Results  Component Value Date   ALT 29 11/21/2018   AST 28 11/21/2018   ALKPHOS 72 09/01/2016   BILITOT 1.2 11/21/2018   Lab Results  Component Value Date   CHOL 143 11/21/2018   Lab Results  Component Value Date   HDL 44 11/21/2018   Lab Results  Component Value Date   LDLCALC 76 11/21/2018   Lab Results  Component  Value Date   TRIG 152 (H) 11/21/2018   Lab Results  Component Value Date   CHOLHDL 3.3 11/21/2018   Lab Results  Component Value Date   PSA 5.98 11/15/2018   PSA 6.03 05/11/2018   PSA 9.2 09/26/2016   Lab Results  Component Value Date   HGBA1C 5.6 10/06/2017   IMPRESSION AND PLAN:  1) HTN: avg here today is fine. Instructions: Check blood pressure and heart rate once daily for the  next 2 wks. Call our office (report to my nurse) or send MyChart message with report of these numbers. No change in meds today. BMET  2) HLD: tolerating statin. FLP and hepatic panel today.    3) CAD: asymptomatic. No change in meds. DAPT->CBC today.  4) Preventative health care: shingrix #2 today.  An After Visit Summary was printed and given to the patient.  FOLLOW UP: Return in about 6 months (around 11/20/2019) for annual CPE (fasting).  Signed:  Crissie Sickles, MD           05/22/2019

## 2019-05-23 ENCOUNTER — Encounter: Payer: Self-pay | Admitting: Family Medicine

## 2019-05-23 LAB — CBC WITH DIFFERENTIAL/PLATELET
Absolute Monocytes: 616 cells/uL (ref 200–950)
Basophils Absolute: 40 cells/uL (ref 0–200)
Basophils Relative: 0.6 %
Eosinophils Absolute: 288 cells/uL (ref 15–500)
Eosinophils Relative: 4.3 %
HCT: 46.9 % (ref 38.5–50.0)
Hemoglobin: 16 g/dL (ref 13.2–17.1)
Lymphs Abs: 2345 cells/uL (ref 850–3900)
MCH: 30.8 pg (ref 27.0–33.0)
MCHC: 34.1 g/dL (ref 32.0–36.0)
MCV: 90.2 fL (ref 80.0–100.0)
MPV: 9.8 fL (ref 7.5–12.5)
Monocytes Relative: 9.2 %
Neutro Abs: 3410 cells/uL (ref 1500–7800)
Neutrophils Relative %: 50.9 %
Platelets: 189 10*3/uL (ref 140–400)
RBC: 5.2 10*6/uL (ref 4.20–5.80)
RDW: 12.6 % (ref 11.0–15.0)
Total Lymphocyte: 35 %
WBC: 6.7 10*3/uL (ref 3.8–10.8)

## 2019-05-23 LAB — COMPREHENSIVE METABOLIC PANEL
AG Ratio: 1.9 (calc) (ref 1.0–2.5)
ALT: 31 U/L (ref 9–46)
AST: 28 U/L (ref 10–35)
Albumin: 4.4 g/dL (ref 3.6–5.1)
Alkaline phosphatase (APISO): 64 U/L (ref 35–144)
BUN: 12 mg/dL (ref 7–25)
CO2: 23 mmol/L (ref 20–32)
Calcium: 9.7 mg/dL (ref 8.6–10.3)
Chloride: 107 mmol/L (ref 98–110)
Creat: 0.99 mg/dL (ref 0.70–1.25)
Globulin: 2.3 g/dL (calc) (ref 1.9–3.7)
Glucose, Bld: 84 mg/dL (ref 65–99)
Potassium: 4.6 mmol/L (ref 3.5–5.3)
Sodium: 140 mmol/L (ref 135–146)
Total Bilirubin: 0.7 mg/dL (ref 0.2–1.2)
Total Protein: 6.7 g/dL (ref 6.1–8.1)

## 2019-05-23 LAB — LIPID PANEL
Cholesterol: 139 mg/dL (ref <200)
HDL: 48 mg/dL (ref >=40)
LDL Cholesterol (Calc): 72 mg/dL (calc)
Non-HDL Cholesterol (Calc): 91 mg/dL (calc) (ref <130)
Total CHOL/HDL Ratio: 2.9 (calc) (ref <5.0)
Triglycerides: 106 mg/dL (ref <150)

## 2019-05-25 ENCOUNTER — Encounter: Payer: Self-pay | Admitting: Family Medicine

## 2019-05-26 ENCOUNTER — Encounter: Payer: Self-pay | Admitting: Family Medicine

## 2019-05-27 ENCOUNTER — Telehealth: Payer: Self-pay

## 2019-05-27 ENCOUNTER — Encounter: Payer: Self-pay | Admitting: Family Medicine

## 2019-05-27 NOTE — Telephone Encounter (Signed)
Patient sent MyChart message with BP/HR readings from 12/17-12/21.  12-17 127/75 P 65 12-18 146/79 P 57 12-19 121/79 P 55 12-20 128/74 P 58 12-21 140/72 P 52  Please advise if any changes recommended. Pt currently on Lisinopril 5mg  qd.

## 2019-05-27 NOTE — Telephone Encounter (Signed)
My Chart message sent

## 2019-05-27 NOTE — Telephone Encounter (Signed)
No changes

## 2019-05-28 ENCOUNTER — Encounter: Payer: Self-pay | Admitting: Family Medicine

## 2019-05-31 ENCOUNTER — Encounter: Payer: Self-pay | Admitting: Family Medicine

## 2019-06-01 ENCOUNTER — Encounter: Payer: Self-pay | Admitting: Family Medicine

## 2019-06-02 ENCOUNTER — Encounter: Payer: Self-pay | Admitting: Family Medicine

## 2019-06-03 ENCOUNTER — Telehealth: Payer: Self-pay

## 2019-06-03 NOTE — Telephone Encounter (Signed)
Very good. NO changes.

## 2019-06-03 NOTE — Telephone Encounter (Signed)
Pt's last visit was 05/22/19. He was advised to report BP/ HR readings for the next 2 weeks. Since last week, he has reported that on 12/25 BP was 132/79 P-62, 06/01/19 114/71 P-57 and on 06/02/19 120/74 P-64.  Please advise if any recommendations?

## 2019-06-10 ENCOUNTER — Encounter: Payer: Self-pay | Admitting: Family Medicine

## 2019-06-24 ENCOUNTER — Encounter: Payer: Self-pay | Admitting: Family Medicine

## 2019-08-11 ENCOUNTER — Other Ambulatory Visit: Payer: Self-pay | Admitting: Cardiology

## 2019-08-14 ENCOUNTER — Other Ambulatory Visit: Payer: Self-pay | Admitting: Cardiology

## 2019-09-26 DIAGNOSIS — Z7189 Other specified counseling: Secondary | ICD-10-CM | POA: Insufficient documentation

## 2019-09-26 NOTE — Progress Notes (Signed)
Cardiology Office Note   Date:  09/27/2019   ID:  Ricky Chavez, DOB 10/13/1955, MRN [id number]  PCP:  [name], MD  Cardiologist:   Minus Breeding, MD   Chief Complaint  Patient presents with  . Follow-up    1 year.      History of Present Illness: Ricky Chavez is a 64 y.o. male who presents for follow up of CAD.  The patient was treated at [facility] for CAD in late 2016.  He had stents to LAD patent; small diagonal stent with 90% ostial stenosis--good overall LV function with mild anterior and focal inferoapical hypokinesis, EF 60-65%. He had unsuccessful angioplasty of the diagonal.  He had a stress echo in 2016 December that was negative for any evidence of ischemia. The patient had a complicated course with his initial angioplasty.  In 2019 he had chest pain.  He had a low risk perfusion study.   Since he was last seen.  Since I last saw him he has had a couple of episodes of fleeting chest discomfort.  However, detail: He is 15 years without symptoms.  He denies any chest pressure, neck or arm discomfort.  He said no new shortness of breath, PND or orthopnea.  He has had no presyncope or syncope.  The palpitations he had that were related to Flomax seem to have gone away.  Past Medical History:  Diagnosis Date  . Allergic rhinitis   . BPH with obstruction/lower urinary tract symptoms    flomax and finasteride qod per urol  . Coronary artery disease    With preserved EF.  Needs DAPT lifetime due to high risk status.  . Elevated PSA onset @ 2009   Neg bx x 3, most recent 2009 ([facility] in [place]).  PSA stable at 3.98 12/01/14 and 4.61 on 07/25/15 per old records.  PSA 10 at Dr. Noland Fordyce ([facility]) 03/2016: flomax started. Recheck PSA here 08/2016 was 10.1, then 9.2 on 08/30/16, 10.8 on 03/2017 at urol f/u: MR prostate 04/2017->BPH. 11/2017 PSA 10.9.  05/2018->6.03. 11/2018->5.98. 05/2019 5.61.  Marland Kitchen GERD (gastroesophageal reflux disease)   . Hearing loss  of both ears   . History of myocardial infarction 2011  . Hyperlipidemia   . Hypertension   . Insomnia   . Organic impotence   . Palpitations 05/2018   Cards suspects symptomatic PACs or PVCs--conservative mgmt, but monitor if worse/persistent.  Needs to minimize alc and cafff  . 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  May need MRI L spine and/or emg/NCS  . Squamous cell carcinoma     Past Surgical History:  Procedure Laterality Date  . CARDIAC CATHETERIZATION  01/31/13   Stents to LAD patent; small diagonal stent with 90% ostial stenosis--good overall LV function with mild anterior and focal inferoapical hypokinesis, EF 60-65%.  Unsuccessful angioplasty of diagonal.  . CARDIOVASCULAR STRESS TEST  05/26/15   stress echo neg for ischemia  . CARDIOVASCULAR STRESS TEST  10/04/2017   Myoc perf imaging: normal (EF 59%)  . COLONOSCOPY  2008; 06/14/16   2008 Normal.  2018 hyperplastic polyp +internal hemorrhoids.  Recall 2028.  Marland Kitchen HYDROCELE EXCISION / REPAIR  early 2000's  . INGUINAL HERNIA REPAIR Bilateral 1998  . PROSTATE BIOPSY     Multiple; all neg.  Prostate MRI 2018 -NO SIGN OF PROSTATE CA.  Marland Kitchen PTCA     Cath 2011---4 stents--LAD and diagonal.  2014 repeat cath for CP showed no  changes.  . Scrotal u/s  01/2015   numerous extratesticular cystic lesions bilat, c/w numerous epididymal cysts vs loculated hydrocele--following expectantly.     Current Outpatient Medications  Medication Sig Dispense Refill  . alfuzosin (UROXATRAL) 10 MG 24 hr tablet Take 10 mg by mouth daily.    Marland Kitchen aspirin EC 81 MG tablet Take 1 tablet by mouth daily.    Marland Kitchen atorvastatin (LIPITOR) 40 MG tablet Take 1 tablet by mouth once daily 90 tablet 0  . BRILINTA 60 MG TABS tablet TAKE 1 TABLET TWICE A DAY 180 tablet 1  . carvedilol (COREG) 6.25 MG tablet Take 1 tablet by mouth twice daily 180 tablet 0  . DUTASTERIDE PO Take by mouth.    . finasteride (PROSCAR) 5 MG tablet Take 5  mg by mouth daily.    Marland Kitchen lisinopril (ZESTRIL) 5 MG tablet Take 1 tablet (5 mg total) by mouth daily. 90 tablet 3  . Melatonin (MELATONIN MAXIMUM STRENGTH) 5 MG TABS Take 1 tablet by mouth at bedtime.    . Multiple Vitamins-Minerals (MULTIVITAMIN ADULT PO) Take 1 capsule by mouth daily.    . nitroGLYCERIN (NITROLINGUAL) 0.4 MG/SPRAY spray Place 1 spray under the tongue every 5 (five) minutes x 3 doses as needed for chest pain. 12 g 1  . omeprazole (PRILOSEC) 20 MG capsule Take 1 capsule by mouth daily.    . tamsulosin (FLOMAX) 0.4 MG CAPS capsule Take 0.4 mg by mouth daily.    . vardenafil (LEVITRA) 20 MG tablet Take 1 tablet by mouth daily as needed.     No current facility-administered medications for this visit.    Allergies:   Patient has no known allergies.    ROS:  Please see the history of present illness.   Otherwise, review of systems are positive for none.   All other systems are reviewed and negative.    PHYSICAL EXAM: VS:  BP 116/70 (BP Location: Right Arm, Patient Position: Sitting, Cuff Size: Normal)   Pulse (!) 51   Temp 97.6 F (36.4 C)   Ht 5\' 8"  (1.727 m)   Wt 193 lb (87.5 kg)   BMI 29.35 kg/m  , BMI Body mass index is 29.35 kg/m. GENERAL:  Well appearing NECK:  No jugular venous distention, waveform within normal limits, carotid upstroke brisk and symmetric, no bruits, no thyromegaly LUNGS:  Clear to auscultation bilaterally CHEST:  Unremarkable HEART:  PMI not displaced or sustained,S1 and S2 within normal limits, no S3, no S4, no clicks, no rubs, no murmurs ABD:  Flat, positive bowel sounds normal in frequency in pitch, no bruits, no rebound, no guarding, no midline pulsatile mass, no hepatomegaly, no splenomegaly EXT:  2 plus pulses throughout, no edema, no cyanosis no clubbing   EKG:  EKG is ordered today. The ekg ordered today demonstrates sinus rhythm, rate 51, axis within normal limits, intervals within normal limits, nonspecific T wave  flattening.   Recent Labs: 05/22/2019: ALT 31; BUN 12; Creat 0.99; Hemoglobin 16.0; Platelets 189; Potassium 4.6; Sodium 140    Lipid Panel    Component Value Date/Time   CHOL 139 05/22/2019 1121   TRIG 106 05/22/2019 1121   HDL 48 05/22/2019 1121   CHOLHDL 2.9 05/22/2019 1121   VLDL 24 09/01/2016 0925   LDLCALC 72 05/22/2019 1121      Wt Readings from Last 3 Encounters:  09/27/19 193 lb (87.5 kg)  05/22/19 195 lb 3.2 oz (88.5 kg)  11/21/18 187 lb 3.2 oz (84.9 kg)  Other studies Reviewed: Additional studies/ records that were reviewed today include: Labs. Review of the above records demonstrates:  Please see elsewhere in the note.     ASSESSMENT AND PLAN:  CAD: The patient has no new sypmtoms.  No further cardiovascular testing is indicated.  We will continue with aggressive risk reduction and meds as listed.   DYSLIPIDEMIA:LDL 72 with an HDL 48.  No change in therapy.  HTN:   Blood pressures well controlled.  Continue meds as listed.  COVID EDUCATION: He has received his vaccine.   Current medicines are reviewed at length with the patient today.  The patient does not have concerns regarding medicines.  The following changes have been made:  no change  Labs/ tests ordered today include: None No orders of the defined types were placed in this encounter.    Disposition:   FU with me in one year.     Signed, Minus Breeding, MD  09/27/2019 11:32 AM    [facility]

## 2019-09-27 ENCOUNTER — Encounter: Payer: Self-pay | Admitting: Cardiology

## 2019-09-27 ENCOUNTER — Ambulatory Visit: Payer: 59 | Admitting: Cardiology

## 2019-09-27 ENCOUNTER — Other Ambulatory Visit: Payer: Self-pay

## 2019-09-27 VITALS — BP 116/70 | HR 51 | Temp 97.6°F | Ht 68.0 in | Wt 193.0 lb

## 2019-09-27 DIAGNOSIS — I10 Essential (primary) hypertension: Secondary | ICD-10-CM

## 2019-09-27 DIAGNOSIS — E785 Hyperlipidemia, unspecified: Secondary | ICD-10-CM

## 2019-09-27 DIAGNOSIS — I2510 Atherosclerotic heart disease of native coronary artery without angina pectoris: Secondary | ICD-10-CM

## 2019-09-27 DIAGNOSIS — Z7189 Other specified counseling: Secondary | ICD-10-CM

## 2019-09-27 DIAGNOSIS — R002 Palpitations: Secondary | ICD-10-CM

## 2019-09-27 NOTE — Patient Instructions (Signed)

## 2019-10-08 ENCOUNTER — Other Ambulatory Visit: Payer: Self-pay | Admitting: Cardiology

## 2019-11-20 LAB — PSA: PSA: 5.2

## 2019-11-22 ENCOUNTER — Other Ambulatory Visit: Payer: Self-pay

## 2019-11-25 ENCOUNTER — Ambulatory Visit (INDEPENDENT_AMBULATORY_CARE_PROVIDER_SITE_OTHER): Payer: 59 | Admitting: Family Medicine

## 2019-11-25 ENCOUNTER — Other Ambulatory Visit: Payer: Self-pay

## 2019-11-25 ENCOUNTER — Encounter: Payer: Self-pay | Admitting: Family Medicine

## 2019-11-25 VITALS — BP 119/73 | HR 54 | Temp 97.5°F | Resp 18 | Ht 68.0 in | Wt 193.2 lb

## 2019-11-25 DIAGNOSIS — R972 Elevated prostate specific antigen [PSA]: Secondary | ICD-10-CM | POA: Diagnosis not present

## 2019-11-25 DIAGNOSIS — R6882 Decreased libido: Secondary | ICD-10-CM | POA: Diagnosis not present

## 2019-11-25 DIAGNOSIS — Z0000 Encounter for general adult medical examination without abnormal findings: Secondary | ICD-10-CM | POA: Diagnosis not present

## 2019-11-25 DIAGNOSIS — R7301 Impaired fasting glucose: Secondary | ICD-10-CM

## 2019-11-25 DIAGNOSIS — Z125 Encounter for screening for malignant neoplasm of prostate: Secondary | ICD-10-CM

## 2019-11-25 NOTE — Progress Notes (Signed)
Office Note 11/25/2019  CC:  Chief Complaint  Patient presents with  . Annual Exam    HPI:  Ricky Chavez is a 64 y.o. White male who is here for annual health maintenance exam. Cardiol f/u with Dr. Percival Spanish 09/2019-->all stable, no changes.  Pt c/o poor libido x 4-6 mo since change to uroxatrol by uroxatrol.  No ED problems. BPH sx's stable. Sleep ok.  Energy level is down.  Motivation is down b/c parents both passed last fall. Still some griefing due to covid-related dragging out of memorial services and tying up loose ends.  Active at home but no formal exercise. Diet: cutting back on snacking.   Past Medical History:  Diagnosis Date  . Allergic rhinitis   . BPH with obstruction/lower urinary tract symptoms    flomax and finasteride qod per urol  . Coronary artery disease    With preserved EF.  Needs DAPT lifetime due to high risk status.  . Elevated PSA onset @ 2009   Neg bx x 3, most recent 2009 ([facility] in [place]).  PSA stable at 3.98 12/01/14 and 4.61 on 07/25/15 per old records.  PSA 10 at Dr. Noland Fordyce ([facility]) 03/2016: flomax started. Recheck PSA here 08/2016 was 10.1, then 9.2 on 08/30/16, 10.8 on 03/2017 at urol f/u: MR prostate 04/2017->BPH. 11/2017 PSA 10.9.  05/2018->6.03. 11/2018->5.98. 05/2019 5.61.  Marland Kitchen GERD (gastroesophageal reflux disease)   . Hearing loss of both ears   . History of myocardial infarction 2011  . Hyperlipidemia   . Hypertension   . Insomnia   . Organic impotence   . Palpitations 05/2018   Cards suspects symptomatic PACs or PVCs--conservative mgmt, but monitor if worse/persistent.  Needs to minimize alc and cafff  . 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  May need MRI L spine and/or emg/NCS  . Squamous cell carcinoma     Past Surgical History:  Procedure Laterality Date  . CARDIAC CATHETERIZATION  01/31/13   Stents to LAD patent; small diagonal stent with 90% ostial  stenosis--good overall LV function with mild anterior and focal inferoapical hypokinesis, EF 60-65%.  Unsuccessful angioplasty of diagonal.  . CARDIOVASCULAR STRESS TEST  05/26/15   stress echo neg for ischemia  . CARDIOVASCULAR STRESS TEST  10/04/2017   Myoc perf imaging: normal (EF 59%)  . COLONOSCOPY  2008; 06/14/16   2008 Normal.  2018 hyperplastic polyp +internal hemorrhoids.  Recall 2028.  Marland Kitchen HYDROCELE EXCISION / REPAIR  early 2000's  . INGUINAL HERNIA REPAIR Bilateral 1998  . PROSTATE BIOPSY     Multiple; all neg.  Prostate MRI 2018 -NO SIGN OF PROSTATE CA.  Marland Kitchen PTCA     Cath 2011---4 stents--LAD and diagonal.  2014 repeat cath for CP showed no changes.  . Scrotal u/s  01/2015   numerous extratesticular cystic lesions bilat, c/w numerous epididymal cysts vs loculated hydrocele--following expectantly.    Family History  Problem Relation Age of Onset  . Hyperlipidemia Mother   . Hypertension Mother   . Heart disease Mother 26       CABG  . Other Mother        Neuroendocrine carcinoma pancreas (solid mass) and liver (diffuse)  . Heart disease Father 71       CABG  . Hyperlipidemia Father   . Hypertension Father   . Colon polyps Father   . Hypothyroidism Sister   . Heart attack Brother 85       MI  .  Prostate cancer Paternal Uncle   . Peripheral vascular disease Brother        Carotid   . Stroke Neg Hx   . Diabetes Neg Hx     Social History   Socioeconomic History  . Marital status: Married    Spouse name: Not on file  . Number of children: 2  . Years of education: Not on file  . Highest education level: Not on file  Occupational History  . Occupation: retired  Tobacco Use  . Smoking status: Former Smoker    Packs/day: 0.25    Years: 2.00    Pack years: 0.50    Types: Cigarettes    Quit date: 06/06/1978    Years since quitting: 41.4  . Smokeless tobacco: Never Used  Substance and Sexual Activity  . Alcohol use: Yes    Alcohol/week: 14.0 standard drinks     Types: 14 Glasses of wine per week  . Drug use: No  . Sexual activity: Not on file  Other Topics Concern  . Not on file  Social History Narrative   Married, 2 grown children.   Relocated to [place] from [facility] 2016.   Educ: Masters degree   Occup: Retired Government social research officer with [employer]   No tob.   Alc: 1 glass wine/night.   Social Determinants of Health   Financial Resource Strain:   . Difficulty of Paying Living Expenses:   Food Insecurity:   . Worried About Charity fundraiser in the Last Year:   . Arboriculturist in the Last Year:   Transportation Needs:   . Film/video editor (Medical):   Marland Kitchen Lack of Transportation (Non-Medical):   Physical Activity:   . Days of Exercise per Week:   . Minutes of Exercise per Session:   Stress:   . Feeling of Stress :   Social Connections:   . Frequency of Communication with Friends and Family:   . Frequency of Social Gatherings with Friends and Family:   . Attends Religious Services:   . Active Member of Clubs or Organizations:   . Attends Archivist Meetings:   Marland Kitchen Marital Status:   Intimate Partner Violence:   . Fear of Current or Ex-Partner:   . Emotionally Abused:   Marland Kitchen Physically Abused:   . Sexually Abused:     Outpatient Medications Prior to Visit  Medication Sig Dispense Refill  . alfuzosin (UROXATRAL) 10 MG 24 hr tablet Take 10 mg by mouth daily.    Marland Kitchen aspirin EC 81 MG tablet Take 1 tablet by mouth daily.    Marland Kitchen atorvastatin (LIPITOR) 40 MG tablet Take 1 tablet by mouth once daily 90 tablet 0  . BRILINTA 60 MG TABS tablet TAKE 1 TABLET TWICE A DAY 180 tablet 1  . carvedilol (COREG) 6.25 MG tablet Take 1 tablet by mouth twice daily 180 tablet 0  . DUTASTERIDE PO Take by mouth.    Marland Kitchen lisinopril (ZESTRIL) 5 MG tablet TAKE 1 TABLET DAILY 90 tablet 3  . Melatonin (MELATONIN MAXIMUM STRENGTH) 5 MG TABS Take 1 tablet by mouth at bedtime.    . Multiple Vitamins-Minerals (MULTIVITAMIN ADULT PO) Take 1 capsule by mouth  daily.    . nitroGLYCERIN (NITROLINGUAL) 0.4 MG/SPRAY spray Place 1 spray under the tongue every 5 (five) minutes x 3 doses as needed for chest pain. 12 g 1  . omeprazole (PRILOSEC) 20 MG capsule Take 1 capsule by mouth daily.    . vardenafil (LEVITRA) 20 MG  tablet Take 1 tablet by mouth daily as needed.    . finasteride (PROSCAR) 5 MG tablet Take 5 mg by mouth daily. (Patient not taking: Reported on 11/25/2019)     No facility-administered medications prior to visit.    No Known Allergies  ROS Review of Systems  Constitutional: Negative for appetite change, chills, fatigue and fever.  HENT: Negative for congestion, dental problem, ear pain and sore throat.   Eyes: Negative for discharge, redness and visual disturbance.  Respiratory: Negative for cough, chest tightness, shortness of breath and wheezing.   Cardiovascular: Negative for chest pain, palpitations and leg swelling.  Gastrointestinal: Negative for abdominal pain, blood in stool, diarrhea, nausea and vomiting.  Genitourinary: Negative for difficulty urinating, dysuria, flank pain, frequency, hematuria and urgency.  Musculoskeletal: Negative for arthralgias, back pain, joint swelling, myalgias and neck stiffness.  Skin: Negative for pallor and rash.  Neurological: Negative for dizziness, speech difficulty, weakness and headaches.  Hematological: Negative for adenopathy. Does not bruise/bleed easily.  Psychiatric/Behavioral: Negative for confusion and sleep disturbance. The patient is not nervous/anxious.     PE; Vitals with BMI 11/25/2019 09/27/2019 05/22/2019  Height 5\' 8"  5\' 8"  -  Weight 193 lbs 4 oz 193 lbs -  BMI 03.47 42.59 -  Systolic 563 875 643  Diastolic 73 70 64  Pulse 54 51 -    Gen: Alert, well appearing.  Patient is oriented to person, place, time, and situation. AFFECT: pleasant, lucid thought and speech. ENT: Ears: EACs clear, normal epithelium.  TMs with good light reflex and landmarks bilaterally.  Eyes:  no injection, icteris, swelling, or exudate.  EOMI, PERRLA. Nose: no drainage or turbinate edema/swelling.  No injection or focal lesion.  Mouth: lips without lesion/swelling.  Oral mucosa pink and moist.  Dentition intact and without obvious caries or gingival swelling.  Oropharynx without erythema, exudate, or swelling.  Neck: supple/nontender.  No LAD, mass, or TM.  Carotid pulses 2+ bilaterally, without bruits. CV: RRR, no m/r/g.   LUNGS: CTA bilat, nonlabored resps, good aeration in all lung fields. ABD: soft, NT, ND, BS normal.  No hepatospenomegaly or mass.  No bruits. EXT: no clubbing, cyanosis, or edema.  Musculoskeletal: no joint swelling, erythema, warmth, or tenderness.  ROM of all joints intact. Skin - no sores or suspicious lesions or rashes or color changes   Pertinent labs:  Lab Results  Component Value Date   TSH 2.07 10/06/2017   Lab Results  Component Value Date   WBC 6.7 05/22/2019   HGB 16.0 05/22/2019   HCT 46.9 05/22/2019   MCV 90.2 05/22/2019   PLT 189 05/22/2019   Lab Results  Component Value Date   CREATININE 0.99 05/22/2019   BUN 12 05/22/2019   NA 140 05/22/2019   K 4.6 05/22/2019   CL 107 05/22/2019   CO2 23 05/22/2019   Lab Results  Component Value Date   ALT 31 05/22/2019   AST 28 05/22/2019   ALKPHOS 72 09/01/2016   BILITOT 0.7 05/22/2019   Lab Results  Component Value Date   CHOL 139 05/22/2019   Lab Results  Component Value Date   HDL 48 05/22/2019   Lab Results  Component Value Date   LDLCALC 72 05/22/2019   Lab Results  Component Value Date   TRIG 106 05/22/2019   Lab Results  Component Value Date   CHOLHDL 2.9 05/22/2019   Lab Results  Component Value Date   PSA 5.61 05/08/2019   PSA 5.98 11/15/2018  PSA 6.03 05/11/2018   Lab Results  Component Value Date   HGBA1C 5.6 10/06/2017    ASSESSMENT AND PLAN:   1) Impaired libido: ? Direct correlation to starting uroxatrol? He is suffering still from grieving  from his parents' passing last fall, so this could be having an impact. Check testost level today.  2) Health maintenance exam: Reviewed age and gender appropriate health maintenance issues (prudent diet, regular exercise, health risks of tobacco and excessive alcohol, use of seatbelts, fire alarms in home, use of sunscreen).  Also reviewed age and gender appropriate health screening as well as vaccine recommendations. Vaccines: all UTD, including covid 19. Labs: A1c, CBC, CMET, FLP. Prostate ca screening: Hx of PSA elev and neg prost bx-->urol follows him.  PSA->has appt next week and he will get PSA at that time. Colon ca screening: recall 2028  An After Visit Summary was printed and given to the patient.  FOLLOW UP:  Return in about 6 months (around 05/26/2020) for routine chronic illness f/u.  Signed:  Crissie Sickles, MD           11/25/2019

## 2019-11-25 NOTE — Patient Instructions (Signed)

## 2019-11-26 LAB — COMPREHENSIVE METABOLIC PANEL
AG Ratio: 1.8 (calc) (ref 1.0–2.5)
ALT: 31 U/L (ref 9–46)
AST: 28 U/L (ref 10–35)
Albumin: 4.4 g/dL (ref 3.6–5.1)
Alkaline phosphatase (APISO): 59 U/L (ref 35–144)
BUN: 15 mg/dL (ref 7–25)
CO2: 22 mmol/L (ref 20–32)
Calcium: 9.5 mg/dL (ref 8.6–10.3)
Chloride: 106 mmol/L (ref 98–110)
Creat: 1.04 mg/dL (ref 0.70–1.25)
Globulin: 2.4 g/dL (calc) (ref 1.9–3.7)
Glucose, Bld: 110 mg/dL — ABNORMAL HIGH (ref 65–99)
Potassium: 4.7 mmol/L (ref 3.5–5.3)
Sodium: 139 mmol/L (ref 135–146)
Total Bilirubin: 0.8 mg/dL (ref 0.2–1.2)
Total Protein: 6.8 g/dL (ref 6.1–8.1)

## 2019-11-26 LAB — CBC WITH DIFFERENTIAL/PLATELET
Absolute Monocytes: 591 cells/uL (ref 200–950)
Basophils Absolute: 51 cells/uL (ref 0–200)
Basophils Relative: 0.7 %
Eosinophils Absolute: 219 cells/uL (ref 15–500)
Eosinophils Relative: 3 %
HCT: 47.1 % (ref 38.5–50.0)
Hemoglobin: 16 g/dL (ref 13.2–17.1)
Lymphs Abs: 1883 cells/uL (ref 850–3900)
MCH: 31 pg (ref 27.0–33.0)
MCHC: 34 g/dL (ref 32.0–36.0)
MCV: 91.3 fL (ref 80.0–100.0)
MPV: 10 fL (ref 7.5–12.5)
Monocytes Relative: 8.1 %
Neutro Abs: 4555 cells/uL (ref 1500–7800)
Neutrophils Relative %: 62.4 %
Platelets: 178 10*3/uL (ref 140–400)
RBC: 5.16 10*6/uL (ref 4.20–5.80)
RDW: 12.6 % (ref 11.0–15.0)
Total Lymphocyte: 25.8 %
WBC: 7.3 10*3/uL (ref 3.8–10.8)

## 2019-11-26 LAB — TESTOSTERONE TOTAL,FREE,BIO, MALES
Albumin: 4.4 g/dL (ref 3.6–5.1)
Sex Hormone Binding: 29 nmol/L (ref 22–77)
Testosterone, Bioavailable: 122.5 ng/dL (ref >=110.0)
Testosterone, Free: 60.9 pg/mL (ref 46.0–224.0)
Testosterone: 414 ng/dL (ref 250–827)

## 2019-11-26 LAB — LIPID PANEL
Cholesterol: 142 mg/dL (ref <200)
HDL: 49 mg/dL (ref >=40)
LDL Cholesterol (Calc): 74 mg/dL (calc)
Non-HDL Cholesterol (Calc): 93 mg/dL (calc) (ref <130)
Total CHOL/HDL Ratio: 2.9 (calc) (ref <5.0)
Triglycerides: 103 mg/dL (ref <150)

## 2019-11-26 LAB — HEMOGLOBIN A1C
Hgb A1c MFr Bld: 5.7 % of total Hgb — ABNORMAL HIGH (ref <5.7)
Mean Plasma Glucose: 117 (calc)
eAG (mmol/L): 6.5 (calc)

## 2019-11-26 LAB — PSA: PSA: 4.4 ng/mL — ABNORMAL HIGH (ref <=4.0)

## 2019-12-02 ENCOUNTER — Encounter: Payer: Self-pay | Admitting: Family Medicine

## 2019-12-03 ENCOUNTER — Other Ambulatory Visit: Payer: Self-pay

## 2019-12-03 DIAGNOSIS — R972 Elevated prostate specific antigen [PSA]: Secondary | ICD-10-CM

## 2019-12-04 ENCOUNTER — Encounter: Payer: Self-pay | Admitting: Family Medicine

## 2019-12-12 ENCOUNTER — Encounter: Payer: Self-pay | Admitting: Family Medicine

## 2019-12-12 ENCOUNTER — Encounter: Payer: Self-pay | Admitting: Urology

## 2019-12-12 MED ORDER — SCOPOLAMINE 1 MG/3DAYS TD PT72: 1.0000 | MEDICATED_PATCH | TRANSDERMAL | 1 refills | Status: DC

## 2019-12-12 NOTE — Telephone Encounter (Signed)
OK, scopolamine patches eRx'd

## 2019-12-12 NOTE — Telephone Encounter (Signed)
Patient's last visit was 6/21 for CPE. No prior/current Rx given for scopolamine patches.  Please advise if appropriate

## 2019-12-24 ENCOUNTER — Encounter: Payer: Self-pay | Admitting: Family Medicine

## 2019-12-24 ENCOUNTER — Encounter: Payer: Self-pay | Admitting: Urology

## 2019-12-24 ENCOUNTER — Other Ambulatory Visit: Payer: Self-pay | Admitting: Cardiology

## 2019-12-24 MED ORDER — ATORVASTATIN CALCIUM 40 MG PO TABS: 40.0000 mg | ORAL_TABLET | Freq: Every day | ORAL | 2 refills | Status: DC

## 2019-12-24 MED ORDER — CARVEDILOL 6.25 MG PO TABS: 6.2500 mg | ORAL_TABLET | Freq: Two times a day (BID) | ORAL | 2 refills | Status: DC

## 2019-12-25 ENCOUNTER — Other Ambulatory Visit: Payer: Self-pay

## 2019-12-25 DIAGNOSIS — N401 Enlarged prostate with lower urinary tract symptoms: Secondary | ICD-10-CM

## 2019-12-25 MED ORDER — DUTASTERIDE 0.5 MG PO CAPS
0.5000 mg | ORAL_CAPSULE | ORAL | 11 refills | Status: DC
Start: 2019-12-25 — End: 2020-01-02

## 2020-01-02 ENCOUNTER — Other Ambulatory Visit: Payer: Self-pay

## 2020-01-02 DIAGNOSIS — N401 Enlarged prostate with lower urinary tract symptoms: Secondary | ICD-10-CM

## 2020-01-02 MED ORDER — DUTASTERIDE 0.5 MG PO CAPS
0.5000 mg | ORAL_CAPSULE | ORAL | 3 refills | Status: AC
Start: 2020-01-02 — End: 2020-03-31

## 2020-01-29 ENCOUNTER — Other Ambulatory Visit: Payer: Self-pay | Admitting: Cardiology

## 2020-02-04 ENCOUNTER — Other Ambulatory Visit: Payer: Self-pay

## 2020-04-16 DIAGNOSIS — H9113 Presbycusis, bilateral: Secondary | ICD-10-CM | POA: Insufficient documentation

## 2020-05-22 ENCOUNTER — Telehealth: Payer: Self-pay | Admitting: Family Medicine

## 2020-05-22 NOTE — Telephone Encounter (Signed)
Patient has upcoming appt on 05/26/20 and wants to know if he needs to fast. Please call patient to advise.

## 2020-05-22 NOTE — Telephone Encounter (Signed)
Advised pt that based off of last visit and labs, it does not look like he will need to be fasting.

## 2020-05-25 ENCOUNTER — Other Ambulatory Visit: Payer: Self-pay

## 2020-05-26 ENCOUNTER — Other Ambulatory Visit: Payer: Self-pay

## 2020-05-26 ENCOUNTER — Ambulatory Visit: Payer: 59 | Admitting: Family Medicine

## 2020-05-26 ENCOUNTER — Encounter: Payer: Self-pay | Admitting: Family Medicine

## 2020-05-26 VITALS — BP 94/60 | HR 54 | Temp 97.6°F | Resp 16 | Ht 68.0 in | Wt 195.6 lb

## 2020-05-26 DIAGNOSIS — R7301 Impaired fasting glucose: Secondary | ICD-10-CM | POA: Diagnosis not present

## 2020-05-26 DIAGNOSIS — I10 Essential (primary) hypertension: Secondary | ICD-10-CM | POA: Diagnosis not present

## 2020-05-26 DIAGNOSIS — E7800 Pure hypercholesterolemia, unspecified: Secondary | ICD-10-CM

## 2020-05-26 DIAGNOSIS — I2510 Atherosclerotic heart disease of native coronary artery without angina pectoris: Secondary | ICD-10-CM | POA: Diagnosis not present

## 2020-05-26 NOTE — Progress Notes (Signed)
OFFICE VISIT  05/26/2020  CC:  Chief Complaint  Patient presents with  . Follow-up    [facility], pt is fasting    HPI:    Patient is a 64 y.o. Caucasian male who presents for 6 mo f/u HTN, HLD, and CAD. A/P as of last visit: "1) Impaired libido: ? Direct correlation to starting uroxatrol? He is suffering still from grieving from his parents' passing last fall, so this could be having an impact. Check testost level today.  2) Health maintenance exam: Reviewed age and gender appropriate health maintenance issues (prudent diet, regular exercise, health risks of tobacco and excessive alcohol, use of seatbelts, fire alarms in home, use of sunscreen).  Also reviewed age and gender appropriate health screening as well as vaccine recommendations. Vaccines: all UTD, including covid 19. Labs: A1c, CBC, CMET, FLP. Prostate ca screening: Hx of PSA elev and neg prost bx-->urol follows him.  PSA->has appt next week and he will get PSA at that time. Colon ca screening: recall 2028"  INTERIM HX: Feeling well. Usual home bp 120/75 or so, nothing persistently lower. No dizziness or signif fatigue. Hunting--deer--is his activity lately.  Can drag a deer out of the woods fine. 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 Tolerating atorvastatin 40mg  qd, diet is decent.  Has f/u appt with urology next week--routine. Has routine cardiology f/u planned in early spring 2022.  No new questions today.  ROS: no fevers, no CP, no SOB, no wheezing, no cough, no dizziness, no HAs, no rashes, no melena/hematochezia.  No polyuria or polydipsia.  No myalgias or arthralgias.  No focal weakness, paresthesias, or tremors.  No acute vision or hearing abnormalities. No n/v/d or abd pain.  No palpitations.     Past Medical History:  Diagnosis Date  . Allergic rhinitis   . BPH with obstruction/lower urinary tract symptoms    flomax and finasteride qod per urol  . Coronary artery disease    With preserved EF.  Needs DAPT  lifetime due to high risk status.  . Elevated PSA onset @ 2009   Neg bx x 3.  MRI c/w bph only.  PSAs stable as of 11/2019. Dr. Demetrios Isaacs  . GERD (gastroesophageal reflux disease)   . Hearing loss of both ears   . History of myocardial infarction 2011  . Hyperlipidemia   . Hypertension   . Insomnia   . Organic impotence   . Palpitations 05/2018   Cards suspects symptomatic PACs or PVCs--conservative mgmt, but monitor if worse/persistent.  Needs to minimize alc and cafff  . 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  May need MRI L spine and/or emg/NCS  . Squamous cell carcinoma     Past Surgical History:  Procedure Laterality Date  . CARDIAC CATHETERIZATION  01/31/13   Stents to LAD patent; small diagonal stent with 90% ostial stenosis--good overall LV function with mild anterior and focal inferoapical hypokinesis, EF 60-65%.  Unsuccessful angioplasty of diagonal.  . CARDIOVASCULAR STRESS TEST  05/26/15   stress echo neg for ischemia  . CARDIOVASCULAR STRESS TEST  10/04/2017   Myoc perf imaging: normal (EF 59%)  . COLONOSCOPY  2008; 06/14/16   2008 Normal.  2018 hyperplastic polyp +internal hemorrhoids.  Recall 2028.  Marland Kitchen HYDROCELE EXCISION / REPAIR  early 2000's  . INGUINAL HERNIA REPAIR Bilateral 1998  . PROSTATE BIOPSY     Multiple; all neg.  Prostate MRI 2018 -NO SIGN OF PROSTATE CA.  Marland Kitchen PTCA  Cath 2011---4 stents--LAD and diagonal.  2014 repeat cath for CP showed no changes.  . Scrotal u/s  01/2015   numerous extratesticular cystic lesions bilat, c/w numerous epididymal cysts vs loculated hydrocele--following expectantly.    Outpatient Medications Prior to Visit  Medication Sig Dispense Refill  . aspirin EC 81 MG tablet Take 1 tablet by mouth daily.    Marland Kitchen atorvastatin (LIPITOR) 40 MG tablet Take 1 tablet (40 mg total) by mouth daily. 90 tablet 2  . AVODART 0.5 MG capsule Take by mouth.    . BRILINTA 60 MG TABS tablet TAKE 1 TABLET  TWICE A DAY 180 tablet 2  . carvedilol (COREG) 6.25 MG tablet Take 1 tablet (6.25 mg total) by mouth 2 (two) times daily. 180 tablet 2  . lisinopril (ZESTRIL) 5 MG tablet TAKE 1 TABLET DAILY 90 tablet 3  . melatonin 5 MG TABS Take 1 tablet by mouth at bedtime.    . Multiple Vitamins-Minerals (MULTIVITAMIN ADULT PO) Take 1 capsule by mouth daily.    Marland Kitchen omeprazole (PRILOSEC) 20 MG capsule Take 1 capsule by mouth daily.    . silodosin (RAPAFLO) 8 MG CAPS capsule Take 8 mg by mouth daily.    . nitroGLYCERIN (NITROLINGUAL) 0.4 MG/SPRAY spray Place 1 spray under the tongue every 5 (five) minutes x 3 doses as needed for chest pain. (Patient not taking: Reported on 05/26/2020) 12 g 1  . alfuzosin (UROXATRAL) 10 MG 24 hr tablet Take 10 mg by mouth daily. (Patient not taking: Reported on 05/26/2020)    . finasteride (PROSCAR) 5 MG tablet Take 5 mg by mouth daily. (Patient not taking: No sig reported)     No facility-administered medications prior to visit.    No Known Allergies  ROS As per HPI  PE: Vitals with BMI 05/26/2020 11/25/2019 09/27/2019  Height 5\' 8"  5\' 8"  5\' 8"   Weight 195 lbs 10 oz 193 lbs 4 oz 193 lbs  BMI 29.75 02.72 53.66  Systolic 94 440 347  Diastolic 60 73 70  Pulse 54 54 51  Repeat bp today 130/82 (manual)   Gen: Alert, well appearing.  Patient is oriented to person, place, time, and situation. AFFECT: pleasant, lucid thought and speech. CV: RRR, no m/r/g.   LUNGS: CTA bilat, nonlabored resps, good aeration in all lung fields. EXT: no clubbing or cyanosis.  no edema.    LABS:  Lab Results  Component Value Date   TESTOSTERONE 414 11/25/2019    Lab Results  Component Value Date   TSH 2.07 10/06/2017   Lab Results  Component Value Date   WBC 7.3 11/25/2019   HGB 16.0 11/25/2019   HCT 47.1 11/25/2019   MCV 91.3 11/25/2019   PLT 178 11/25/2019   Lab Results  Component Value Date   CREATININE 1.04 11/25/2019   BUN 15 11/25/2019   NA 139 11/25/2019   K 4.7  11/25/2019   CL 106 11/25/2019   CO2 22 11/25/2019   Lab Results  Component Value Date   ALT 31 11/25/2019   AST 28 11/25/2019   ALKPHOS 72 09/01/2016   BILITOT 0.8 11/25/2019   Lab Results  Component Value Date   CHOL 142 11/25/2019   Lab Results  Component Value Date   HDL 49 11/25/2019   Lab Results  Component Value Date   LDLCALC 74 11/25/2019   Lab Results  Component Value Date   TRIG 103 11/25/2019   Lab Results  Component Value Date   CHOLHDL 2.9 11/25/2019  Lab Results  Component Value Date   PSA 4.4 (H) 11/25/2019   PSA 5.20 11/20/2019   PSA 5.61 05/08/2019   PSA 5.61 05/08/2019   Lab Results  Component Value Date   HGBA1C 5.7 (H) 11/25/2019   IMPRESSION AND PLAN:  1) HTN: well controlled. Cont lisin 5 qd and coreg 6.25 bid. Lytes/cr today.  2) HLD: tolerating atorva 40mg  qd long term. FLP today.  3) CAD; asymptomatic. DAPT indefinitely per cards, has f/u planned early spring 2022. Enc inc CV exercise.  4) IFG: 100-110 range. Hba1c 5.6 in 2019, then 5.7 6 mo ago. Checking fasting glucose today.  An After Visit Summary was printed and given to the patient.  FOLLOW UP: Return in about 6 months (around 11/24/2020) for annual CPE (fasting) + f/u [facility].  Signed:  Crissie Sickles, MD           05/26/2020

## 2020-05-27 LAB — LIPID PANEL
Cholesterol: 142 mg/dL (ref <200)
HDL: 46 mg/dL (ref >=40)
LDL Cholesterol (Calc): 74 mg/dL (calc)
Non-HDL Cholesterol (Calc): 96 mg/dL (calc) (ref <130)
Total CHOL/HDL Ratio: 3.1 (calc) (ref <5.0)
Triglycerides: 137 mg/dL (ref <150)

## 2020-05-27 LAB — BASIC METABOLIC PANEL
BUN: 16 mg/dL (ref 7–25)
CO2: 29 mmol/L (ref 20–32)
Calcium: 9.3 mg/dL (ref 8.6–10.3)
Chloride: 105 mmol/L (ref 98–110)
Creat: 1.04 mg/dL (ref 0.70–1.25)
Glucose, Bld: 103 mg/dL — ABNORMAL HIGH (ref 65–99)
Potassium: 4.7 mmol/L (ref 3.5–5.3)
Sodium: 142 mmol/L (ref 135–146)

## 2020-06-03 ENCOUNTER — Ambulatory Visit: Payer: 59 | Admitting: Urology

## 2020-07-01 NOTE — Progress Notes (Unsigned)
See student note from this date. Signed:  Phil Kyzer Blowe, MD           07/02/2020  

## 2020-07-02 ENCOUNTER — Ambulatory Visit (HOSPITAL_BASED_OUTPATIENT_CLINIC_OR_DEPARTMENT_OTHER)
Admission: RE | Admit: 2020-07-02 | Discharge: 2020-07-02 | Disposition: A | Discharge status: Home / Self Care | Payer: 59 | Source: Ambulatory Visit | Attending: Family Medicine | Admitting: Family Medicine

## 2020-07-02 ENCOUNTER — Other Ambulatory Visit: Payer: Self-pay

## 2020-07-02 ENCOUNTER — Encounter: Payer: Self-pay | Admitting: Family Medicine

## 2020-07-02 ENCOUNTER — Ambulatory Visit: Payer: 59 | Admitting: Family Medicine

## 2020-07-02 VITALS — BP 122/76 | HR 56 | Temp 97.4°F | Resp 16 | Wt 196.0 lb

## 2020-07-02 DIAGNOSIS — M25561 Pain in right knee: Secondary | ICD-10-CM | POA: Insufficient documentation

## 2020-07-02 MED ORDER — HYDROCODONE-ACETAMINOPHEN 5-325 MG PO TABS
1.0000 | ORAL_TABLET | Freq: Four times a day (QID) | ORAL | 0 refills | Status: DC | PRN
Start: 2020-07-02 — End: 2020-07-02

## 2020-07-02 MED ORDER — HYDROCODONE-ACETAMINOPHEN 5-325 MG PO TABS: 1.0000 | ORAL_TABLET | Freq: Four times a day (QID) | ORAL | 0 refills | Status: DC | PRN

## 2020-07-02 NOTE — Patient Instructions (Signed)
Take 3 ibuprofen every morning and every evening with food for the next 5 days. Don't run or do vigorous work for 5 days.

## 2020-07-02 NOTE — Progress Notes (Signed)
CC: Right Knee Pain  HPI:  Coady "Ricky Chavez" Ahmir Delangel is a 65 year old male who presents to the clinic this morning with 3 to 4 week history of sharp right knee pain.  Around [place] Years, patient began to notice sharp pain on medial aspect of right knee. Not having pain on treadmill, and when walking normal. Pain reproducible on twisting motions, crossing legs, and sitting in recliner where feet do not touch the ground. Was on mission trip last week to [place] and could not sleep well because of the pain. Not having pain when sleeping in bed at home. Pain is improved slightly today. Patient has been taking OTC tylenol and ibuprofen with moderate effect.  Patient has not noticed any popping or clunking in right knee, but feels as though joint will slightly separate when lifting right leg on ambulation. No previous injury or surgery on right knee. Pain not reproducible when ascending and descending stairs. Patient denies any fever, chills, cough, or infectious symptoms prior to, or since, onset of pain. Patient also denies any erythema, edema, or signs of effusion in right knee.    PMH: Past Medical History:  Diagnosis Date  . Allergic rhinitis   . BPH with obstruction/lower urinary tract symptoms    flomax and finasteride qod per urol  . Coronary artery disease    With preserved EF.  Needs DAPT lifetime due to high risk status.  . Elevated PSA onset @ 2009   Neg bx x 3.  MRI c/w bph only.  PSAs stable as of 11/2019. Dr. Demetrios Isaacs  . GERD (gastroesophageal reflux disease)   . Hearing loss of both ears   . History of myocardial infarction 2011  . Hyperlipidemia   . Hypertension   . Insomnia   . Organic impotence   . Palpitations 05/2018   Cards suspects symptomatic PACs or PVCs--conservative mgmt, but monitor if worse/persistent.  Needs to minimize alc and cafff  . 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  May need MRI L spine and/or emg/NCS   . Squamous cell carcinoma     M/A: Current Outpatient Medications on File Prior to Visit  Medication Sig Dispense Refill  . aspirin EC 81 MG tablet Take 1 tablet by mouth daily.    Marland Kitchen atorvastatin (LIPITOR) 40 MG tablet Take 1 tablet (40 mg total) by mouth daily. 90 tablet 2  . AVODART 0.5 MG capsule Take by mouth.    . BRILINTA 60 MG TABS tablet TAKE 1 TABLET TWICE A DAY 180 tablet 2  . carvedilol (COREG) 6.25 MG tablet Take 1 tablet (6.25 mg total) by mouth 2 (two) times daily. 180 tablet 2  . lisinopril (ZESTRIL) 5 MG tablet TAKE 1 TABLET DAILY 90 tablet 3  . melatonin 5 MG TABS Take 1 tablet by mouth at bedtime.    . Multiple Vitamins-Minerals (MULTIVITAMIN ADULT PO) Take 1 capsule by mouth daily.    . nitroGLYCERIN (NITROLINGUAL) 0.4 MG/SPRAY spray Place 1 spray under the tongue every 5 (five) minutes x 3 doses as needed for chest pain. (Patient not taking: Reported on 05/26/2020) 12 g 1  . omeprazole (PRILOSEC) 20 MG capsule Take 1 capsule by mouth daily.    . silodosin (RAPAFLO) 8 MG CAPS capsule Take 8 mg by mouth daily.     No current facility-administered medications on file prior to visit.   No Known Allergies  FH: Family History  Problem Relation Age of Onset  . Hyperlipidemia Mother   .  Hypertension Mother   . Heart disease Mother 88       CABG  . Other Mother        Neuroendocrine carcinoma pancreas (solid mass) and liver (diffuse)  . Heart disease Father 36       CABG  . Hyperlipidemia Father   . Hypertension Father   . Colon polyps Father   . Hypothyroidism Sister   . Heart attack Brother 24       MI  . Prostate cancer Paternal Uncle   . Peripheral vascular disease Brother        Carotid   . Stroke Neg Hx   . Diabetes Neg Hx     SH: Social History   Socioeconomic History  . Marital status: Married    Spouse name: Not on file  . Number of children: 2  . Years of education: Not on file  . Highest education level: Not on file  Occupational History   . Occupation: retired  Tobacco Use  . Smoking status: Former Smoker    Packs/day: 0.25    Years: 2.00    Pack years: 0.50    Types: Cigarettes    Quit date: 06/06/1978    Years since quitting: 42.1  . Smokeless tobacco: Never Used  Substance and Sexual Activity  . Alcohol use: Yes    Alcohol/week: 14.0 standard drinks    Types: 14 Glasses of wine per week  . Drug use: No  . Sexual activity: Not on file  Other Topics Concern  . Not on file  Social History Narrative   Married, 2 grown children.   Relocated to [place] from [facility] 2016.   Educ: Masters degree   Occup: Retired Government social research officer with [employer]   No tob.   Alc: 1 glass wine/night.   Social Determinants of Health   Financial Resource Strain: Not on file  Food Insecurity: Not on file  Transportation Needs: Not on file  Physical Activity: Not on file  Stress: Not on file  Social Connections: Not on file    ROS: Review of Systems  Constitutional: Negative for chills and fever.  Respiratory: Negative for cough.     PE: Vitals with BMI 05/26/2020 11/25/2019 09/27/2019  Height 5\' 8"  5\' 8"  5\' 8"   Weight 195 lbs 10 oz 193 lbs 4 oz 193 lbs  BMI 29.75 66.06 30.16  Systolic 94 010 932  Diastolic 60 73 70  Pulse 54 54 51    Physical Exam Constitutional:      Appearance: Normal appearance.  HENT:     Head: Normocephalic and atraumatic.  Cardiovascular:     Rate and Rhythm: Normal rate and regular rhythm.     Heart sounds: Normal heart sounds. No murmur heard. No friction rub. No gallop.   Pulmonary:     Effort: Pulmonary effort is normal.     Breath sounds: Normal breath sounds.  Musculoskeletal:     Right knee: No swelling, deformity, effusion, erythema or ecchymosis. Normal range of motion. Tenderness present over the medial joint line. No LCL laxity, MCL laxity, ACL laxity or PCL laxity. Normal patellar mobility.     Instability Tests: Posterior drawer test negative. Anterior Lachman test negative.      Left knee: No swelling, deformity, effusion, erythema or ecchymosis. Normal range of motion. No tenderness. No LCL laxity, MCL laxity, ACL laxity or PCL laxity.Normal patellar mobility.     Instability Tests: Posterior drawer test negative. Anterior Lachman test negative.     Right  lower leg: No edema.     Left lower leg: No edema.  Neurological:     Mental Status: He is alert.     Labs: Recent Results (from the past 2160 hour(s))  Basic metabolic panel     Status: Abnormal   Collection Time: 05/26/20  8:33 AM  Result Value Ref Range   Glucose, Bld 103 (H) 65 - 99 mg/dL    Comment: .            Fasting reference interval . For someone without known diabetes, a glucose value between 100 and 125 mg/dL is consistent with prediabetes and should be confirmed with a follow-up test. .    BUN 16 7 - 25 mg/dL   Creat 1.04 0.70 - 1.25 mg/dL    Comment: For patients >55 years of age, the reference limit for Creatinine is approximately 13% higher for people identified as African-American. .    BUN/Creatinine Ratio NOT APPLICABLE 6 - 22 (calc)   Sodium 142 135 - 146 mmol/L   Potassium 4.7 3.5 - 5.3 mmol/L   Chloride 105 98 - 110 mmol/L   CO2 29 20 - 32 mmol/L   Calcium 9.3 8.6 - 10.3 mg/dL  Lipid panel     Status: None   Collection Time: 05/26/20  8:33 AM  Result Value Ref Range   Cholesterol 142 <200 mg/dL   HDL 46 > OR = 40 mg/dL   Triglycerides 137 <150 mg/dL   LDL Cholesterol (Calc) 74 mg/dL (calc)    Comment: Reference range: <100 . Desirable range <100 mg/dL for primary prevention;   <70 mg/dL for patients with CHD or diabetic patients  with > or = 2 CHD risk factors. Marland Kitchen LDL-C is now calculated using the Martin-Hopkins  calculation, which is a validated novel method providing  better accuracy than the Friedewald equation in the  estimation of LDL-C.  Cresenciano Genre et al. Annamaria Helling. 1540;086(76): 2061-2068  (http://education.[facility].com/faq/FAQ164)    Total CHOL/HDL  Ratio 3.1 <5.0 (calc)   Non-HDL Cholesterol (Calc) 96 <130 mg/dL (calc)    Comment: For patients with diabetes plus 1 major ASCVD risk  factor, treating to a non-HDL-C goal of <100 mg/dL  (LDL-C of <70 mg/dL) is considered a therapeutic  option.      A/P: In summary, Ricky Chavez is a 65 y.o. year old male who presents to the clinic with 3 to 4 week history of right knee pain.  1) Right medial meniscus injury of unknown etiology:  Patient with 3-4 week history of sharp, medial knee pain that is reproducible with "twisting" motions. Physical exam reveals point tenderness over medial joint line. Negative lachman, posterior drawer sign. Varum and Valgus testing negative and symmetrical between right and left knee. Presenting symptoms appear to be more consistent with medial meniscal injury, however, patient denies inciting event/trauma and effusion/edema, and popping/clunking of knee that would be more classic presentation for meniscal injury. Additional diagnosis considered are pes anserine bursitis but pain not occurring when walking up and down stairs and is more localized to medial joint line. Additionally, considered osteoarthritis, but pain would be expected to get worse throughout the day and joint stiffness which does not match patient history. - OTC ibuprofen 600 mg BID - Ice right knee - Soft knee sleeve for additional stability - Activity modification: avoiding activities that stress right knee - Xray of right knee to assess for skeletal etiology of pain  Patient instructed to call for follow up appointment if knee  pain is not relieved, or worsens over the next 5 days.  Spent 20 min with pt today reviewing HPI, reviewing relevant past history, doing exam, reviewing and discussing lab and imaging data, and formulating plans.   Follow Up:  PRN for right knee pain  Signed: Nanetta Batty, MS3  I personally was present during the history, physical exam, and medical  decision-making activities of this service and have verified that the service and findings are accurately documented in the student's note. Next step is referral to sports medicine or ortho IF he is not improving signif in the next 1 week with our conservative approach. Signed:  Crissie Sickles, MD           07/02/2020

## 2020-07-13 ENCOUNTER — Other Ambulatory Visit: Payer: Self-pay

## 2020-07-13 ENCOUNTER — Other Ambulatory Visit: Payer: 59

## 2020-07-13 DIAGNOSIS — R972 Elevated prostate specific antigen [PSA]: Secondary | ICD-10-CM

## 2020-07-14 LAB — PSA, TOTAL AND FREE
PSA, Free Pct: 13.5 %
PSA, Free: 0.7 ng/mL
Prostate Specific Ag, Serum: 5.2 ng/mL — ABNORMAL HIGH (ref 0.0–4.0)

## 2020-07-15 ENCOUNTER — Encounter: Payer: Self-pay | Admitting: Urology

## 2020-07-15 ENCOUNTER — Ambulatory Visit (INDEPENDENT_AMBULATORY_CARE_PROVIDER_SITE_OTHER): Payer: 59 | Admitting: Urology

## 2020-07-15 ENCOUNTER — Other Ambulatory Visit: Payer: Self-pay

## 2020-07-15 VITALS — BP 117/72 | HR 53 | Temp 97.7°F | Ht 68.0 in | Wt 195.0 lb

## 2020-07-15 DIAGNOSIS — R3912 Poor urinary stream: Secondary | ICD-10-CM | POA: Diagnosis not present

## 2020-07-15 DIAGNOSIS — N138 Other obstructive and reflux uropathy: Secondary | ICD-10-CM | POA: Diagnosis not present

## 2020-07-15 DIAGNOSIS — R972 Elevated prostate specific antigen [PSA]: Secondary | ICD-10-CM

## 2020-07-15 DIAGNOSIS — N401 Enlarged prostate with lower urinary tract symptoms: Secondary | ICD-10-CM | POA: Diagnosis not present

## 2020-07-15 HISTORY — DX: Other obstructive and reflux uropathy: N13.8

## 2020-07-15 HISTORY — DX: Poor urinary stream: R39.12

## 2020-07-15 LAB — URINALYSIS, ROUTINE W REFLEX MICROSCOPIC
Bilirubin, UA: NEGATIVE
Glucose, UA: NEGATIVE
Ketones, UA: NEGATIVE
Leukocytes,UA: NEGATIVE
Nitrite, UA: NEGATIVE
Protein,UA: NEGATIVE
RBC, UA: NEGATIVE
Specific Gravity, UA: 1.01 (ref 1.005–1.030)
Urobilinogen, Ur: 0.2 mg/dL (ref 0.2–1.0)
pH, UA: 7 (ref 5.0–7.5)

## 2020-07-15 MED ORDER — CIPROFLOXACIN HCL 500 MG PO TABS
500.0000 mg | ORAL_TABLET | Freq: Once | ORAL | Status: AC
  Administered 2020-07-15: 500 mg via ORAL

## 2020-07-15 NOTE — Patient Instructions (Signed)

## 2020-07-15 NOTE — Progress Notes (Signed)
07/15/2020 10:38 AM   Ricky Chavez 19-Jan-1956 [id number]  Referring provider: [name], MD [address] [place],  [place] [address]  Followup elevated PSA and BPH  HPI: Ricky Chavez is a 65yo here for followup for elevated PSa and BPH. PSA stable at 5.2. He is currently on rapaflo and dutasteride with stable LUTS. Overall he is unhappy with his urination and wishes to pursue surgical intervention if possible. No hematuria or dysuria  His records from [facility] are as follows: I have symptoms of an enlarged prostate.  HPI: Ricky Chavez is a 65 year-old male established patient who is here for symptoms of enlarged prostate.  03/18/2016: He has been on BPH meds since 2010. He has had elevated PSA and 3 negative biopsies   09/26/2016: He stopped the dutasteride. He noted occasional nocturia.   04/03/2017: He has intermittency while on flomax. His stream is weaker since stopping dutasteride.   05/15/2018: He stream is improved on flomax and finasteride. He has decreased libido since starting finasteride.   11/15/2018: He is now on flomax and finasteride QOD due to an irregular heartbeat.   04/09/2019: Changed to Avodart QOD at last OV, he was continued on Tamsulosin.   05/14/2019: He is on flomax and avodart QOD. Stream is OK and it was better on daily but he was having PVCs   11/26/19: He was changed from Flomax to Uroxatral at prior office visit. He states that since this change, he has noted weaker urinary stream. He also complains of more episodes of straining and intermittency. No dysuria or gross hematuria. He remains on Avodart, which he states he is using daily. He denies ongoing issues with PVC's. He does complain of decreased libido, which is attributes to Uroxatral.   He first noticed the symptoms approximately 03/06/2008. His symptoms have not gotten worse over the last year. He has been treated with Flomax and Avodart. The patient has never had a surgical procedure  for bladder outlet obstruction to his prostate.   He usually gets up at night to urinate 1 time. He does not have to wait a long time to start his urinary stream. He does not have to strain or bear down to start his urinary stream. He does have a good size and strength to his urinary stream. He does not dribble at the end of urination. He is not having problems with emptying his bladder well.   His urine has not shut off completely. He has not previously had an indwelling catheter in for more than two weeks at a time.   He has had a PSA done. He does not have any family members who have been diagnosed with prostate cancer.     CC: My PSA is elevated above the normal range.  HPI: 09/26/2016: Repeat PSA was 9.2   04/03/2017: His PSA has risen to 10.80   11/14/2017: PSA is stable at 10.9   05/15/2018: PSA decreased to 6 on finasteride. He notes decreased libido on finasteride   11/15/2018: PSA stable at 5.98   04/09/2019: Changed to Avodart QOD at last OV. He is due for repeat lab assessment of PSA in December.   05/14/2019: PSA stable at 5.61. no worsening LUTS. he was treated for a UTI 1 month ago.   11/26/19: PSA stable at 5.20   His PSA is 10. He indicates there were no urinary problems when the PSA was drawn. He has not had a prostate nodule on a physical  examination. He has not had recurrent prostate infections or chronic prostatitis.   He has not been on antibiotics for prostate infections previously. He has had a prostate biopsy done. His first prostate biopsy was done approximately 03/07/2011. He does not have the pathology report from his biopsy.     AUA Symptom Score: Less than 50% of the time he has the sensation of not emptying his bladder completely when finished urinating. Less than 50% of the time he has to urinate again fewer than two hours after he has finished urinating. Less than 50% of the time he has to start and stop again several times when he urinates. Less than 50% of  the time he finds it difficult to postpone urination. Less than 50% of the time he has a weak urinary stream. Less than 50% of the time he has to push or strain to begin urination. He has to get up to urinate 1 time from the time he goes to bed until the time he gets up in the morning.   Calculated AUA Symptom Score: 13    QOL Score: He would feel pleased if he had to live with his urinary condition the way it is now for the rest of his life.   Calculated QOL Symptom Score: 1      PMH: Past Medical History:  Diagnosis Date  . Allergic rhinitis   . BPH with obstruction/lower urinary tract symptoms    flomax and finasteride qod per urol  . Coronary artery disease    With preserved EF.  Needs DAPT lifetime due to high risk status.  . Elevated PSA onset @ 2009   Neg bx x 3.  MRI c/w bph only.  PSAs stable as of 11/2019. Dr. Demetrios Isaacs  . GERD (gastroesophageal reflux disease)   . Hearing loss of both ears   . History of myocardial infarction 2011  . Hyperlipidemia   . Hypertension   . Insomnia   . Organic impotence   . Palpitations 05/2018   Cards suspects symptomatic PACs or PVCs--conservative mgmt, but monitor if worse/persistent.  Needs to minimize alc and cafff  . 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  May need MRI L spine and/or emg/NCS  . Squamous cell carcinoma     Surgical History: Past Surgical History:  Procedure Laterality Date  . CARDIAC CATHETERIZATION  01/31/13   Stents to LAD patent; small diagonal stent with 90% ostial stenosis--good overall LV function with mild anterior and focal inferoapical hypokinesis, EF 60-65%.  Unsuccessful angioplasty of diagonal.  . CARDIOVASCULAR STRESS TEST  05/26/15   stress echo neg for ischemia  . CARDIOVASCULAR STRESS TEST  10/04/2017   Myoc perf imaging: normal (EF 59%)  . COLONOSCOPY  2008; 06/14/16   2008 Normal.  2018 hyperplastic polyp +internal hemorrhoids.  Recall 2028.  Marland Kitchen  HYDROCELE EXCISION / REPAIR  early 2000's  . INGUINAL HERNIA REPAIR Bilateral 1998  . PROSTATE BIOPSY     Multiple; all neg.  Prostate MRI 2018 -NO SIGN OF PROSTATE CA.  Marland Kitchen PTCA     Cath 2011---4 stents--LAD and diagonal.  2014 repeat cath for CP showed no changes.  . Scrotal u/s  01/2015   numerous extratesticular cystic lesions bilat, c/w numerous epididymal cysts vs loculated hydrocele--following expectantly.    Home Medications:  Allergies as of 07/15/2020   No Known Allergies     Medication List       Accurate as of July 15, 2020  10:38 AM. If you have any questions, ask your nurse or doctor.        aspirin EC 81 MG tablet Take 1 tablet by mouth daily.   atorvastatin 40 MG tablet Commonly known as: LIPITOR Take 1 tablet (40 mg total) by mouth daily.   Avodart 0.5 MG capsule Generic drug: dutasteride Take by mouth.   Brilinta 60 MG Tabs tablet Generic drug: ticagrelor TAKE 1 TABLET TWICE A DAY   carvedilol 6.25 MG tablet Commonly known as: COREG Take 1 tablet (6.25 mg total) by mouth 2 (two) times daily.   HYDROcodone-acetaminophen 5-325 MG tablet Commonly known as: NORCO/VICODIN Take 1-2 tablets by mouth every 6 (six) hours as needed for moderate pain.   lisinopril 5 MG tablet Commonly known as: ZESTRIL TAKE 1 TABLET DAILY   melatonin 5 MG Tabs Take 1 tablet by mouth at bedtime.   MULTIVITAMIN ADULT PO Take 1 capsule by mouth daily.   nitroGLYCERIN 0.4 MG/SPRAY spray Commonly known as: NITROLINGUAL Place 1 spray under the tongue every 5 (five) minutes x 3 doses as needed for chest pain.   omeprazole 20 MG capsule Commonly known as: PRILOSEC Take 1 capsule by mouth daily.   silodosin 8 MG Caps capsule Commonly known as: RAPAFLO Take 8 mg by mouth daily.       Allergies: No Known Allergies  Family History: Family History  Problem Relation Age of Onset  . Hyperlipidemia Mother   . Hypertension Mother   . Heart disease Mother 49       CABG   . Other Mother        Neuroendocrine carcinoma pancreas (solid mass) and liver (diffuse)  . Heart disease Father 84       CABG  . Hyperlipidemia Father   . Hypertension Father   . Colon polyps Father   . Hypothyroidism Sister   . Heart attack Brother 27       MI  . Prostate cancer Paternal Uncle   . Peripheral vascular disease Brother        Carotid   . Stroke Neg Hx   . Diabetes Neg Hx     Social History:  reports that he quit smoking about 42 years ago. His smoking use included cigarettes. He has a 0.50 pack-year smoking history. He has never used smokeless tobacco. He reports current alcohol use of about 14.0 standard drinks of alcohol per week. He reports that he does not use drugs.  ROS: All other review of systems were reviewed and are negative except what is noted above in HPI  Physical Exam: BP 117/72   Pulse (!) 53   Temp 97.7 F (36.5 C)   Ht 5\' 8"  (1.727 m)   Wt 195 lb (88.5 kg)   BMI 29.65 kg/m   Constitutional:  Alert and oriented, No acute distress. HEENT: [place] AT, moist mucus membranes.  Trachea midline, no masses. Cardiovascular: No clubbing, cyanosis, or edema. Respiratory: Normal respiratory effort, no increased work of breathing. GI: Abdomen is soft, nontender, nondistended, no abdominal masses GU: No CVA tenderness. Circumcised phallus. No masses/lesions on penis, testis, scrotum. Prostate 40g smooth no nodules no induration.  Lymph: No cervical or inguinal lymphadenopathy. Skin: No rashes, bruises or suspicious lesions. Neurologic: Grossly intact, no focal deficits, moving all 4 extremities. Psychiatric: Normal mood and affect.  Laboratory Data: Lab Results  Component Value Date   WBC 7.3 11/25/2019   HGB 16.0 11/25/2019   HCT 47.1 11/25/2019   MCV 91.3 11/25/2019  PLT 178 11/25/2019    Lab Results  Component Value Date   CREATININE 1.04 05/26/2020    Lab Results  Component Value Date   PSA 4.4 (H) 11/25/2019   PSA 5.20 11/20/2019    PSA 5.61 05/08/2019   PSA 5.61 05/08/2019    Lab Results  Component Value Date   TESTOSTERONE 414 11/25/2019    Lab Results  Component Value Date   HGBA1C 5.7 (H) 11/25/2019    Urinalysis No results found for: COLORURINE, APPEARANCEUR, LABSPEC, PHURINE, GLUCOSEU, HGBUR, BILIRUBINUR, KETONESUR, PROTEINUR, UROBILINOGEN, NITRITE, LEUKOCYTESUR  No results found for: LABMICR, [place], RBCUA, LABEPIT, MUCUS, BACTERIA  Pertinent Imaging:  No results found for this or any previous visit.  No results found for this or any previous visit.  No results found for this or any previous visit.  No results found for this or any previous visit.  No results found for this or any previous visit.  No results found for this or any previous visit.  No results found for this or any previous visit.  No results found for this or any previous visit.   Assessment & Plan:    1. Elevated PSA -RTC 6 months with PSA - Urinalysis, Routine w reflex microscopic  2. Benign prostatic hyperplasia with urinary obstruction -Continue rapaflo and dutasteride  3. Weak urinary stream -rapaflo and dutasteride   No follow-ups on file.  Nicolette Bang, MD  [facility]    07/15/20  CC: No chief complaint on file.   HPI:  Blood pressure 117/72, pulse (!) 53, temperature 97.7 F (36.5 C), height 5\' 8"  (1.727 m), weight 195 lb (88.5 kg). NED. A&Ox3.   No respiratory distress   Abd soft, NT, ND Normal phallus with bilateral descended testicles  Cystoscopy Procedure Note  Patient identification was confirmed, informed consent was obtained, and patient was prepped using Betadine solution.  Lidocaine jelly was administered per urethral meatus.     Pre-Procedure: - Inspection reveals a normal caliber ureteral meatus.  Procedure: The flexible cystoscope was introduced without difficulty - No urethral strictures/lesions are present. - Enlarged prostate Trilobar  hyperplasia - Normal bladder neck - Bilateral ureteral orifices identified - Bladder mucosa  reveals no ulcers, tumors, or lesions - No bladder stones - No trabeculation  Retroflexion shows 1cm intravesical prostatic protrusion   Post-Procedure: - Patient tolerated the procedure well  Nicolette Bang, MD

## 2020-07-15 NOTE — Progress Notes (Signed)
Urological Symptom Review  Patient is experiencing the following symptoms: Stream starts and stops Weak stream   Review of Systems  Gastrointestinal (upper)  : Negative for upper GI symptoms  Gastrointestinal (lower) : Negative for lower GI symptoms  Constitutional : Negative for symptoms  Skin: Negative for skin symptoms  Eyes: Negative for eye symptoms  Ear/Nose/Throat : Negative for Ear/Nose/Throat symptoms  Hematologic/Lymphatic: Negative for Hematologic/Lymphatic symptoms  Cardiovascular : Negative for cardiovascular symptoms  Respiratory : Negative for respiratory symptoms  Endocrine: Negative for endocrine symptoms  Musculoskeletal: Joint pain  Neurological: Negative for neurological symptoms  Psychologic: Negative for psychiatric symptoms

## 2020-07-28 ENCOUNTER — Encounter: Payer: Self-pay | Admitting: Family Medicine

## 2020-07-28 NOTE — Telephone Encounter (Signed)
OK to get a covid test here b/c ours is PCR. Ideally, he could get it 2 d prior to leaving and have result before he left.

## 2020-07-28 NOTE — Telephone Encounter (Signed)
Please advise if ok for pt wife, Jamarl Pew, to get COVID test done as well.

## 2020-08-05 ENCOUNTER — Other Ambulatory Visit: Payer: Self-pay | Admitting: Cardiology

## 2020-08-05 NOTE — Progress Notes (Signed)
Cardiology Office Note   Date:  08/06/2020   ID:  Ricky Chavez, DOB 07-10-1955, MRN [id number]  PCP:  [name], MD  Cardiologist:   Minus Breeding, MD   Chief Complaint  Patient presents with  . Coronary Artery Disease      History of Present Illness: Ricky Chavez is a 65 y.o. male who presents for follow up of CAD.  The patient was treated at [facility] for CAD in late 2016.  He had stents to LAD patent; small diagonal stent with 90% ostial stenosis--good overall LV function with mild anterior and focal inferoapical hypokinesis, EF 60-65%. He had unsuccessful angioplasty of the diagonal.  He had a stress echo in 2016 December that was negative for any evidence of ischemia. The patient had a complicated course with his initial angioplasty.  In 2019 he had chest pain.  He had a low risk perfusion study.   Since he was last seen he has done well.  The patient denies any new symptoms such as chest discomfort, neck or arm discomfort. There has been no new shortness of breath, PND or orthopnea. There have been no reported palpitations, presyncope or syncope.   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 With this he denies any cardiovascular symptoms.  Past Medical History:  Diagnosis Date  . Allergic rhinitis   . BPH with obstruction/lower urinary tract symptoms    flomax and finasteride qod per urol  . Coronary artery disease    With preserved EF.  Needs DAPT lifetime due to high risk status.  . Elevated PSA onset @ 2009   Neg bx x 3.  MRI c/w bph only.  PSAs stable as of 11/2019. Dr. Demetrios Isaacs  . GERD (gastroesophageal reflux disease)   . Hearing loss of both ears   . History of myocardial infarction 2011  . Hyperlipidemia   . Hypertension   . Insomnia   . Organic impotence   . Palpitations 05/2018   PVCs  . 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  May need MRI L spine and/or emg/NCS  . Squamous cell carcinoma      Past Surgical History:  Procedure Laterality Date  . CARDIAC CATHETERIZATION  01/31/13   Stents to LAD patent; small diagonal stent with 90% ostial stenosis--good overall LV function with mild anterior and focal inferoapical hypokinesis, EF 60-65%.  Unsuccessful angioplasty of diagonal.  . CARDIOVASCULAR STRESS TEST  05/26/15   stress echo neg for ischemia  . CARDIOVASCULAR STRESS TEST  10/04/2017   Myoc perf imaging: normal (EF 59%)  . COLONOSCOPY  2008; 06/14/16   2008 Normal.  2018 hyperplastic polyp +internal hemorrhoids.  Recall 2028.  Marland Kitchen HYDROCELE EXCISION / REPAIR  early 2000's  . INGUINAL HERNIA REPAIR Bilateral 1998  . PROSTATE BIOPSY     Multiple; all neg.  Prostate MRI 2018 -NO SIGN OF PROSTATE CA.  Marland Kitchen PTCA     Cath 2011---4 stents--LAD and diagonal.  2014 repeat cath for CP showed no changes.  . Scrotal u/s  01/2015   numerous extratesticular cystic lesions bilat, c/w numerous epididymal cysts vs loculated hydrocele--following expectantly.     Current Outpatient Medications  Medication Sig Dispense Refill  . aspirin EC 81 MG tablet Take 1 tablet by mouth daily.    Marland Kitchen atorvastatin (LIPITOR) 40 MG tablet Take 1 tablet by mouth once daily 90 tablet 0  . AVODART 0.5 MG capsule Take by mouth.    Marland Kitchen  BRILINTA 60 MG TABS tablet TAKE 1 TABLET TWICE A DAY 180 tablet 2  . carvedilol (COREG) 6.25 MG tablet Take 1 tablet by mouth twice daily 180 tablet 0  . lisinopril (ZESTRIL) 5 MG tablet TAKE 1 TABLET DAILY 90 tablet 3  . melatonin 5 MG TABS Take 1 tablet by mouth at bedtime.    . Multiple Vitamins-Minerals (MULTIVITAMIN ADULT PO) Take 1 capsule by mouth daily.    . nitroGLYCERIN (NITROLINGUAL) 0.4 MG/SPRAY spray Place 1 spray under the tongue every 5 (five) minutes x 3 doses as needed for chest pain. 12 g 1  . omeprazole (PRILOSEC) 20 MG capsule Take 1 capsule by mouth daily.    . silodosin (RAPAFLO) 8 MG CAPS capsule Take 8 mg by mouth daily.     No current facility-administered  medications for this visit.    Allergies:   Patient has no known allergies.    ROS:  Please see the history of present illness.   Otherwise, review of systems are positive for none.   All other systems are reviewed and negative.    PHYSICAL EXAM: VS:  BP 102/64 (BP Location: Left Arm, Patient Position: Sitting)   Pulse (!) 51   Ht 5\' 8"  (1.727 m)   Wt 195 lb 6.4 oz (88.6 kg)   SpO2 97%   BMI 29.71 kg/m  , BMI Body mass index is 29.71 kg/m. GENERAL:  Well appearing NECK:  No jugular venous distention, waveform within normal limits, carotid upstroke brisk and symmetric, no bruits, no thyromegaly LUNGS:  Clear to auscultation bilaterally CHEST:  Unremarkable HEART:  PMI not displaced or sustained,S1 and S2 within normal limits, no S3, no S4, no clicks, no rubs, no murmurs ABD:  Flat, positive bowel sounds normal in frequency in pitch, no bruits, no rebound, no guarding, no midline pulsatile mass, no hepatomegaly, no splenomegaly EXT:  2 plus pulses throughout, no edema, no cyanosis no clubbing   EKG:  EKG is  ordered today. The ekg ordered today demonstrates sinus rhythm, rate 51, axis within normal limits, intervals within normal limits, nonspecific T wave flattening.   Recent Labs: 11/25/2019: ALT 31; Hemoglobin 16.0; Platelets 178 05/26/2020: BUN 16; Creat 1.04; Potassium 4.7; Sodium 142    Lipid Panel    Component Value Date/Time   CHOL 142 05/26/2020 0833   TRIG 137 05/26/2020 0833   HDL 46 05/26/2020 0833   CHOLHDL 3.1 05/26/2020 0833   VLDL 24 09/01/2016 0925   LDLCALC 74 05/26/2020 0833      Wt Readings from Last 3 Encounters:  08/06/20 195 lb 6.4 oz (88.6 kg)  07/15/20 195 lb (88.5 kg)  07/02/20 196 lb (88.9 kg)      Other studies Reviewed: Additional studies/ records that were reviewed today include: Labs. Review of the above records demonstrates:  Please see elsewhere in the note.     ASSESSMENT AND PLAN:  CAD:  The patient has no new sypmtoms.  No  further cardiovascular testing is indicated.  We will continue with aggressive risk reduction and meds as listed.  DYSLIPIDEMIA:LDL was 73. No change in therapy.  HTN:   Blood pressure is well controlled. No change in therapy.   Current medicines are reviewed at length with the patient today.  The patient does not have concerns regarding medicines.  The following changes have been made:  None  Labs/ tests ordered today include:    Orders Placed This Encounter  Procedures  . EKG 12-Lead  Disposition:   FU with me in 12 months   Signed, Minus Breeding, MD  08/06/2020 10:52 AM    [place]

## 2020-08-06 ENCOUNTER — Other Ambulatory Visit: Payer: Self-pay

## 2020-08-06 ENCOUNTER — Encounter: Payer: Self-pay | Admitting: Cardiology

## 2020-08-06 ENCOUNTER — Ambulatory Visit (INDEPENDENT_AMBULATORY_CARE_PROVIDER_SITE_OTHER): Payer: 59 | Admitting: Cardiology

## 2020-08-06 VITALS — BP 102/64 | HR 51 | Ht 68.0 in | Wt 195.4 lb

## 2020-08-06 DIAGNOSIS — E785 Hyperlipidemia, unspecified: Secondary | ICD-10-CM | POA: Diagnosis not present

## 2020-08-06 DIAGNOSIS — I25810 Atherosclerosis of coronary artery bypass graft(s) without angina pectoris: Secondary | ICD-10-CM

## 2020-08-06 DIAGNOSIS — I10 Essential (primary) hypertension: Secondary | ICD-10-CM | POA: Diagnosis not present

## 2020-08-06 MED ORDER — TICAGRELOR 60 MG PO TABS: 60.0000 mg | ORAL_TABLET | Freq: Two times a day (BID) | ORAL | 2 refills | Status: DC

## 2020-08-06 MED ORDER — LISINOPRIL 5 MG PO TABS: 5.0000 mg | ORAL_TABLET | Freq: Every day | ORAL | 3 refills | Status: DC

## 2020-08-06 MED ORDER — ATORVASTATIN CALCIUM 40 MG PO TABS: 40.0000 mg | ORAL_TABLET | Freq: Every day | ORAL | 3 refills | Status: DC

## 2020-08-06 MED ORDER — CARVEDILOL 6.25 MG PO TABS: 6.2500 mg | ORAL_TABLET | Freq: Two times a day (BID) | ORAL | 3 refills | Status: DC

## 2020-08-06 NOTE — Patient Instructions (Signed)
Medication Instructions:  No changes *If you need a refill on your cardiac medications before your next appointment, please call your pharmacy*  Follow-Up: At [facility], you and your health needs are our priority.  As part of our continuing mission to provide you with exceptional heart care, we have created designated Provider Care Teams.  These Care Teams include your primary Cardiologist (physician) and Advanced Practice Providers (APPs -  Physician Assistants and Nurse Practitioners) who all work together to provide you with the care you need, when you need it.  Your next appointment:   12 month(s) You will receive a reminder letter in the mail two months in advance. If you don't receive a letter, please call our office to schedule the follow-up appointment.  The format for your next appointment:   In Person  Provider:   James Hochrein, MD   

## 2020-09-01 DIAGNOSIS — U071 COVID-19: Secondary | ICD-10-CM

## 2020-09-01 HISTORY — DX: COVID-19: U07.1

## 2020-11-26 ENCOUNTER — Other Ambulatory Visit: Payer: Self-pay

## 2020-11-26 ENCOUNTER — Ambulatory Visit (INDEPENDENT_AMBULATORY_CARE_PROVIDER_SITE_OTHER): Payer: 59 | Admitting: Family Medicine

## 2020-11-26 ENCOUNTER — Encounter: Payer: Self-pay | Admitting: Family Medicine

## 2020-11-26 VITALS — BP 97/62 | HR 58 | Temp 97.3°F | Resp 16 | Ht 68.25 in | Wt 188.8 lb

## 2020-11-26 DIAGNOSIS — I2510 Atherosclerotic heart disease of native coronary artery without angina pectoris: Secondary | ICD-10-CM

## 2020-11-26 DIAGNOSIS — I10 Essential (primary) hypertension: Secondary | ICD-10-CM | POA: Diagnosis not present

## 2020-11-26 DIAGNOSIS — E7800 Pure hypercholesterolemia, unspecified: Secondary | ICD-10-CM

## 2020-11-26 DIAGNOSIS — Z0000 Encounter for general adult medical examination without abnormal findings: Secondary | ICD-10-CM

## 2020-11-26 DIAGNOSIS — R7301 Impaired fasting glucose: Secondary | ICD-10-CM

## 2020-11-26 NOTE — Progress Notes (Signed)
Office Note 11/26/2020  CC:  Chief Complaint  Patient presents with   Annual Exam    Pt is fasting   HPI:  Ricky Chavez is a 65 y.o. White male who is here for annual health maintenance exam and 7 mo f/u HTN, HLD, and CAD. A/P as of last visit: "1) HTN: well controlled. Cont lisin 5 qd and coreg 6.25 bid. Lytes/cr today.   2) HLD: tolerating atorva 40mg  qd long term. FLP today.   3) CAD; asymptomatic. DAPT indefinitely per cards, has f/u planned early spring 2022. Enc inc CV exercise.   4) IFG: 100-110 range. Hba1c 5.6 in 2019, then 5.7 6 mo ago. Checking fasting glucose today."  INTERIM HX: Feeling fine.  No complaints.  HTN: no home monitoring.  HLD: tolerating atorva 40 qd.  CAD: Reviewed cardiology f/u 08/2020 (Dr. Hochrein)->no changes in mgmt, no new testing.  IFG: admits he needs to work on lower carb intake, is eating out much more the last 6 mo.    Past Medical History:  Diagnosis Date   Allergic rhinitis    BPH with obstruction/lower urinary tract symptoms    flomax and finasteride qod per urol   Coronary artery disease    With preserved EF.  Needs DAPT lifetime due to high risk status.   COVID 09/01/2020   Elevated PSA onset @ 2009   Neg bx x 3.  MRI c/w bph only.  PSAs stable as of 11/2019. Dr. Demetrios Isaacs   GERD (gastroesophageal reflux disease)    Hearing loss of both ears    History of myocardial infarction 2011   Hyperlipidemia    Hypertension    Insomnia    Organic impotence    Palpitations 05/2018   PVCs   Sciatica    Piriformis syndrome likely--improving with gabapentin as of 04/2017.  May need MRI L spine and/or emg/NCS   Squamous cell carcinoma     Past Surgical History:  Procedure Laterality Date   CARDIAC CATHETERIZATION  01/31/13   Stents to LAD patent; small diagonal stent with 90% ostial stenosis--good overall LV function with mild anterior and focal inferoapical hypokinesis, EF 60-65%.  Unsuccessful angioplasty  of diagonal.   CARDIOVASCULAR STRESS TEST  05/26/15   stress echo neg for ischemia   CARDIOVASCULAR STRESS TEST  10/04/2017   Myoc perf imaging: normal (EF 59%)   COLONOSCOPY  2008; 06/14/16   2008 Normal.  2018 hyperplastic polyp +internal hemorrhoids.  Recall 2028.   HYDROCELE EXCISION / REPAIR  early 2000's   INGUINAL HERNIA REPAIR Bilateral 1998   PROSTATE BIOPSY     Multiple; all neg.  Prostate MRI 2018 -NO SIGN OF PROSTATE CA.   PTCA     Cath 2011---4 stents--LAD and diagonal.  2014 repeat cath for CP showed no changes.   Scrotal u/s  01/2015   numerous extratesticular cystic lesions bilat, c/w numerous epididymal cysts vs loculated hydrocele--following expectantly.    Family History  Problem Relation Age of Onset   Hyperlipidemia Mother    Hypertension Mother    Heart disease Mother 35       CABG   Other Mother        Neuroendocrine carcinoma pancreas (solid mass) and liver (diffuse)   Heart disease Father 48       CABG   Hyperlipidemia Father    Hypertension Father    Colon polyps Father    Hypothyroidism Sister    Heart attack Brother 59  MI   Prostate cancer Paternal Uncle    Peripheral vascular disease Brother        Carotid    Stroke Neg Hx    Diabetes Neg Hx     Social History   Socioeconomic History   Marital status: Married    Spouse name: Not on file   Number of children: 2   Years of education: Not on file   Highest education level: Not on file  Occupational History   Occupation: retired  Tobacco Use   Smoking status: Former    Packs/day: 0.25    Years: 2.00    Pack years: 0.50    Types: Cigarettes    Quit date: 06/06/1978    Years since quitting: 42.5   Smokeless tobacco: Never  Substance and Sexual Activity   Alcohol use: Yes    Alcohol/week: 14.0 standard drinks    Types: 14 Glasses of wine per week   Drug use: No   Sexual activity: Not on file  Other Topics Concern   Not on file  Social History Narrative   Married, 2 grown  children.   Relocated to [place] from [facility] 2016.   Educ: Masters degree   Occup: Retired Government social research officer with [employer]   No tob.   Alc: 1 glass wine/night.   Social Determinants of Health   Financial Resource Strain: Not on file  Food Insecurity: Not on file  Transportation Needs: Not on file  Physical Activity: Not on file  Stress: Not on file  Social Connections: Not on file  Intimate Partner Violence: Not on file    Outpatient Medications Prior to Visit  Medication Sig Dispense Refill   aspirin EC 81 MG tablet Take 1 tablet by mouth daily.     atorvastatin (LIPITOR) 40 MG tablet Take 1 tablet (40 mg total) by mouth daily. 90 tablet 3   AVODART 0.5 MG capsule Take by mouth.     carvedilol (COREG) 6.25 MG tablet Take 1 tablet (6.25 mg total) by mouth 2 (two) times daily. 180 tablet 3   lisinopril (ZESTRIL) 5 MG tablet Take 1 tablet (5 mg total) by mouth daily. 90 tablet 3   melatonin 5 MG TABS Take 1 tablet by mouth at bedtime.     Multiple Vitamins-Minerals (MULTIVITAMIN ADULT PO) Take 1 capsule by mouth daily.     omeprazole (PRILOSEC) 20 MG capsule Take 1 capsule by mouth daily.     silodosin (RAPAFLO) 8 MG CAPS capsule Take 8 mg by mouth daily.     ticagrelor (BRILINTA) 60 MG TABS tablet Take 1 tablet (60 mg total) by mouth 2 (two) times daily. 180 tablet 2   nitroGLYCERIN (NITROLINGUAL) 0.4 MG/SPRAY spray Place 1 spray under the tongue every 5 (five) minutes x 3 doses as needed for chest pain. (Patient not taking: Reported on 11/26/2020) 12 g 1   No facility-administered medications prior to visit.    No Known Allergies  ROS Review of Systems  Constitutional:  Negative for appetite change, chills, fatigue and fever.  HENT:  Negative for congestion, dental problem, ear pain and sore throat.   Eyes:  Negative for discharge, redness and visual disturbance.  Respiratory:  Negative for cough, chest tightness, shortness of breath and wheezing.   Cardiovascular:   Negative for chest pain, palpitations and leg swelling.  Gastrointestinal:  Negative for abdominal pain, blood in stool, diarrhea, nausea and vomiting.  Genitourinary:  Negative for difficulty urinating, dysuria, flank pain, frequency, hematuria and urgency.  Musculoskeletal:  Negative for arthralgias, back pain, joint swelling, myalgias and neck stiffness.  Skin:  Negative for pallor and rash.  Neurological:  Negative for dizziness, speech difficulty, weakness and headaches.  Hematological:  Negative for adenopathy. Does not bruise/bleed easily.  Psychiatric/Behavioral:  Negative for confusion and sleep disturbance. The patient is not nervous/anxious.    PE; Vitals with BMI 11/26/2020 08/06/2020 07/15/2020  Height 5' 8.25" 5\' 8"  5\' 8"   Weight 188 lbs 13 oz 195 lbs 6 oz 195 lbs  BMI 28.48 95.63 87.56  Systolic 97 433 295  Diastolic 62 64 72  Pulse 58 51 53   Gen: Alert, well appearing.  Patient is oriented to person, place, time, and situation. AFFECT: pleasant, lucid thought and speech. ENT: Ears: EACs clear, normal epithelium.  TMs with good light reflex and landmarks bilaterally.  Eyes: no injection, icteris, swelling, or exudate.  EOMI, PERRLA. Nose: no drainage or turbinate edema/swelling.  No injection or focal lesion.  Mouth: lips without lesion/swelling.  Oral mucosa pink and moist.  Dentition intact and without obvious caries or gingival swelling.  Oropharynx without erythema, exudate, or swelling.  Neck: supple/nontender.  No LAD, mass, or TM.  Carotid pulses 2+ bilaterally, without bruits. CV: RRR, no m/r/g.   LUNGS: CTA bilat, nonlabored resps, good aeration in all lung fields. ABD: soft, NT, ND, BS normal.  No hepatospenomegaly or mass.  No bruits. EXT: no clubbing, cyanosis, or edema.  Musculoskeletal: no joint swelling, erythema, warmth, or tenderness.  ROM of all joints intact. Skin - no sores or suspicious lesions or rashes or color changes  Pertinent labs:  Lab Results   Component Value Date   TSH 2.07 10/06/2017   Lab Results  Component Value Date   TESTOSTERONE 414 11/25/2019    Lab Results  Component Value Date   WBC 7.3 11/25/2019   HGB 16.0 11/25/2019   HCT 47.1 11/25/2019   MCV 91.3 11/25/2019   PLT 178 11/25/2019   Lab Results  Component Value Date   CREATININE 1.04 05/26/2020   BUN 16 05/26/2020   NA 142 05/26/2020   K 4.7 05/26/2020   CL 105 05/26/2020   CO2 29 05/26/2020   Lab Results  Component Value Date   ALT 31 11/25/2019   AST 28 11/25/2019   ALKPHOS 72 09/01/2016   BILITOT 0.8 11/25/2019   Lab Results  Component Value Date   CHOL 142 05/26/2020   Lab Results  Component Value Date   HDL 46 05/26/2020   Lab Results  Component Value Date   LDLCALC 74 05/26/2020   Lab Results  Component Value Date   TRIG 137 05/26/2020   Lab Results  Component Value Date   CHOLHDL 3.1 05/26/2020   Lab Results  Component Value Date   PSA 4.4 (H) 11/25/2019   PSA 5.20 11/20/2019   PSA 5.61 05/08/2019   PSA 5.61 05/08/2019   Lab Results  Component Value Date   HGBA1C 5.7 (H) 11/25/2019   ASSESSMENT AND PLAN:   1) HTN: very good control on coreg 6.25 bid and lisinopril 5 qd. Lytes/cr today.  2) HLD: tolerating atorva 40 qd. FLP and hepatic panel today.  3) Prediabetes: needs to work more on improving diet and exercise level. A1c today.  4) CAD; asymptomatic.  Statin and BB. DAPT indefinitely per cards.  5) Health maintenance exam: Reviewed age and gender appropriate health maintenance issues (prudent diet, regular exercise, health risks of tobacco and excessive alcohol, use of seatbelts, fire alarms in  home, use of sunscreen).  Also reviewed age and gender appropriate health screening as well as vaccine recommendations. Vaccines: ALL UTD. Labs: fasting HP + Hba1c. Prostate ca screening: hx of elev PSA, prostate bx neg, followed by urol. Colon ca screening: recall 2028.  An After Visit Summary was printed  and given to the patient.  FOLLOW UP:  No follow-ups on file.  Signed:  Crissie Sickles, MD           11/26/2020

## 2020-11-26 NOTE — Addendum Note (Signed)
Addended by: Octaviano Glow on: 11/26/2020 09:01 AM   Modules accepted: Orders

## 2020-11-26 NOTE — Patient Instructions (Signed)
Health Maintenance, Male Adopting a healthy lifestyle and getting preventive care are important in promoting health and wellness. Ask your health care provider about: The right schedule for you to have regular tests and exams. Things you can do on your own to prevent diseases and keep yourself healthy. What should I know about diet, weight, and exercise? Eat a healthy diet  Eat a diet that includes plenty of vegetables, fruits, low-fat dairy products, and lean protein. Do not eat a lot of foods that are high in solid fats, added sugars, or sodium.  Maintain a healthy weight Body mass index (BMI) is a measurement that can be used to identify possible weight problems. It estimates body fat based on height and weight. Your health care provider can help determine your BMI and help you achieve or maintain ahealthy weight. Get regular exercise Get regular exercise. This is one of the most important things you can do for your health. Most adults should: Exercise for at least 150 minutes each week. The exercise should increase your heart rate and make you sweat (moderate-intensity exercise). Do strengthening exercises at least twice a week. This is in addition to the moderate-intensity exercise. Spend less time sitting. Even light physical activity can be beneficial. Watch cholesterol and blood lipids Have your blood tested for lipids and cholesterol at 64 years of age, then havethis test every 5 years. You may need to have your cholesterol levels checked more often if: Your lipid or cholesterol levels are high. You are older than 64 years of age. You are at high risk for heart disease. What should I know about cancer screening? Many types of cancers can be detected early and may often be prevented. Depending on your health history and family history, you may need to have cancer screening at various ages. This may include screening for: Colorectal cancer. Prostate cancer. Skin cancer. Lung  cancer. What should I know about heart disease, diabetes, and high blood pressure? Blood pressure and heart disease High blood pressure causes heart disease and increases the risk of stroke. This is more likely to develop in people who have high blood pressure readings, are of African descent, or are overweight. Talk with your health care provider about your target blood pressure readings. Have your blood pressure checked: Every 3-5 years if you are 18-39 years of age. Every year if you are 40 years old or older. If you are between the ages of 65 and 75 and are a current or former smoker, ask your health care provider if you should have a one-time screening for abdominal aortic aneurysm (AAA). Diabetes Have regular diabetes screenings. This checks your fasting blood sugar level. Have the screening done: Once every three years after age 45 if you are at a normal weight and have a low risk for diabetes. More often and at a younger age if you are overweight or have a high risk for diabetes. What should I know about preventing infection? Hepatitis B If you have a higher risk for hepatitis B, you should be screened for this virus. Talk with your health care provider to find out if you are at risk forhepatitis B infection. Hepatitis C Blood testing is recommended for: Everyone born from 1945 through 1965. Anyone with known risk factors for hepatitis C. Sexually transmitted infections (STIs) You should be screened each year for STIs, including gonorrhea and chlamydia, if: You are sexually active and are younger than 64 years of age. You are older than 64 years of age   and your health care provider tells you that you are at risk for this type of infection. Your sexual activity has changed since you were last screened, and you are at increased risk for chlamydia or gonorrhea. Ask your health care provider if you are at risk. Ask your health care provider about whether you are at high risk for HIV.  Your health care provider may recommend a prescription medicine to help prevent HIV infection. If you choose to take medicine to prevent HIV, you should first get tested for HIV. You should then be tested every 3 months for as long as you are taking the medicine. Follow these instructions at home: Lifestyle Do not use any products that contain nicotine or tobacco, such as cigarettes, e-cigarettes, and chewing tobacco. If you need help quitting, ask your health care provider. Do not use street drugs. Do not share needles. Ask your health care provider for help if you need support or information about quitting drugs. Alcohol use Do not drink alcohol if your health care provider tells you not to drink. If you drink alcohol: Limit how much you have to 0-2 drinks a day. Be aware of how much alcohol is in your drink. In the [place] one drink equals one 12 oz bottle of beer (355 mL), one 5 oz glass of wine (148 mL), or one 1 oz glass of hard liquor (44 mL). General instructions Schedule regular health, dental, and eye exams. Stay current with your vaccines. Tell your health care provider if: You often feel depressed. You have ever been abused or do not feel safe at home. Summary Adopting a healthy lifestyle and getting preventive care are important in promoting health and wellness. Follow your health care provider's instructions about healthy diet, exercising, and getting tested or screened for diseases. Follow your health care provider's instructions on monitoring your cholesterol and blood pressure. This information is not intended to replace advice given to you by your health care provider. Make sure you discuss any questions you have with your healthcare provider. Document Revised: 05/16/2018 Document Reviewed: 05/16/2018 Elsevier Patient Education  2022 [employer]  

## 2020-11-27 LAB — COMPREHENSIVE METABOLIC PANEL
AG Ratio: 1.8 (calc) (ref 1.0–2.5)
ALT: 27 U/L (ref 9–46)
AST: 26 U/L (ref 10–35)
Albumin: 4.3 g/dL (ref 3.6–5.1)
Alkaline phosphatase (APISO): 60 U/L (ref 35–144)
BUN: 23 mg/dL (ref 7–25)
CO2: 22 mmol/L (ref 20–32)
Calcium: 9.6 mg/dL (ref 8.6–10.3)
Chloride: 107 mmol/L (ref 98–110)
Creat: 1 mg/dL (ref 0.70–1.25)
Globulin: 2.4 g/dL (calc) (ref 1.9–3.7)
Glucose, Bld: 104 mg/dL — ABNORMAL HIGH (ref 65–99)
Potassium: 4.6 mmol/L (ref 3.5–5.3)
Sodium: 143 mmol/L (ref 135–146)
Total Bilirubin: 0.8 mg/dL (ref 0.2–1.2)
Total Protein: 6.7 g/dL (ref 6.1–8.1)

## 2020-11-27 LAB — CBC WITH DIFFERENTIAL/PLATELET
Absolute Monocytes: 523 cells/uL (ref 200–950)
Basophils Absolute: 63 cells/uL (ref 0–200)
Basophils Relative: 1 %
Eosinophils Absolute: 239 cells/uL (ref 15–500)
Eosinophils Relative: 3.8 %
HCT: 45.7 % (ref 38.5–50.0)
Hemoglobin: 15.1 g/dL (ref 13.2–17.1)
Lymphs Abs: 1915 cells/uL (ref 850–3900)
MCH: 31.1 pg (ref 27.0–33.0)
MCHC: 33 g/dL (ref 32.0–36.0)
MCV: 94 fL (ref 80.0–100.0)
MPV: 9.4 fL (ref 7.5–12.5)
Monocytes Relative: 8.3 %
Neutro Abs: 3560 cells/uL (ref 1500–7800)
Neutrophils Relative %: 56.5 %
Platelets: 185 10*3/uL (ref 140–400)
RBC: 4.86 10*6/uL (ref 4.20–5.80)
RDW: 13 % (ref 11.0–15.0)
Total Lymphocyte: 30.4 %
WBC: 6.3 10*3/uL (ref 3.8–10.8)

## 2020-11-27 LAB — HEMOGLOBIN A1C
Hgb A1c MFr Bld: 5.5 % of total Hgb (ref <5.7)
Mean Plasma Glucose: 111 mg/dL
eAG (mmol/L): 6.2 mmol/L

## 2020-11-27 LAB — LIPID PANEL
Cholesterol: 137 mg/dL (ref <200)
HDL: 49 mg/dL (ref >=40)
LDL Cholesterol (Calc): 69 mg/dL (calc)
Non-HDL Cholesterol (Calc): 88 mg/dL (calc) (ref <130)
Total CHOL/HDL Ratio: 2.8 (calc) (ref <5.0)
Triglycerides: 111 mg/dL (ref <150)

## 2020-11-27 LAB — TSH: TSH: 1.85 mIU/L (ref 0.40–4.50)

## 2021-01-12 ENCOUNTER — Other Ambulatory Visit: Payer: Self-pay | Admitting: Urology

## 2021-01-14 ENCOUNTER — Other Ambulatory Visit: Payer: Self-pay | Admitting: Urology

## 2021-01-15 ENCOUNTER — Other Ambulatory Visit: Payer: Self-pay

## 2021-01-15 ENCOUNTER — Telehealth: Payer: 59 | Admitting: Urology

## 2021-01-15 DIAGNOSIS — R972 Elevated prostate specific antigen [PSA]: Secondary | ICD-10-CM

## 2021-01-26 ENCOUNTER — Other Ambulatory Visit: Payer: 59

## 2021-01-26 ENCOUNTER — Other Ambulatory Visit: Payer: Self-pay

## 2021-01-27 LAB — PSA: Prostate Specific Ag, Serum: 8.7 ng/mL — ABNORMAL HIGH (ref 0.0–4.0)

## 2021-02-02 ENCOUNTER — Other Ambulatory Visit: Payer: Self-pay

## 2021-02-02 ENCOUNTER — Encounter: Payer: Self-pay | Admitting: Urology

## 2021-02-02 ENCOUNTER — Telehealth: Payer: 59 | Admitting: Urology

## 2021-02-02 DIAGNOSIS — R972 Elevated prostate specific antigen [PSA]: Secondary | ICD-10-CM | POA: Diagnosis not present

## 2021-02-02 DIAGNOSIS — N401 Enlarged prostate with lower urinary tract symptoms: Secondary | ICD-10-CM

## 2021-02-02 DIAGNOSIS — R3912 Poor urinary stream: Secondary | ICD-10-CM | POA: Diagnosis not present

## 2021-02-02 DIAGNOSIS — N138 Other obstructive and reflux uropathy: Secondary | ICD-10-CM

## 2021-02-02 MED ORDER — SILODOSIN 8 MG PO CAPS: 8.0000 mg | ORAL_CAPSULE | Freq: Every day | ORAL | 3 refills | Status: DC

## 2021-02-02 NOTE — Progress Notes (Signed)
02/02/2021 2:12 PM   Ricky Chavez 08-Jun-1955 [id number]  Referring provider: [name], MD [address] [place],  [place] [address]  Patient location: home Physician location: office I connected with  Ricky Chavez on 02/02/21 by a video enabled telemedicine application and verified that I am speaking with the correct person using two identifiers.   I discussed the limitations of evaluation and management by telemedicine. The patient expressed understanding and agreed to proceed.    Followup elevated PSA and BPH  HPI: Ricky Chavez is a 65yo here for followup for elevated PSA and BPH. His PSa increased to 8.7 from 5.2. He was on avodart and stopped it for several months ago and then restarted 1 month ago. He denies any worsening LUTS. He is considering Urolift. No other complaints today.    PMH: Past Medical History:  Diagnosis Date   Allergic rhinitis    BPH with obstruction/lower urinary tract symptoms    flomax and finasteride qod per urol   Coronary artery disease    With preserved EF.  Needs DAPT lifetime due to high risk status.   COVID 09/01/2020   Elevated PSA onset @ 2009   Neg bx x 3.  MRI c/w bph only.  PSAs stable as of 11/2019. Dr. Demetrios Isaacs   GERD (gastroesophageal reflux disease)    Hearing loss of both ears    History of myocardial infarction 2011   Hyperlipidemia    Hypertension    Insomnia    Organic impotence    Palpitations 05/2018   PVCs   Sciatica    Piriformis syndrome likely--improving with gabapentin as of 04/2017.  May need MRI L spine and/or emg/NCS   Squamous cell carcinoma     Surgical History: Past Surgical History:  Procedure Laterality Date   CARDIAC CATHETERIZATION  01/31/13   Stents to LAD patent; small diagonal stent with 90% ostial stenosis--good overall LV function with mild anterior and focal inferoapical hypokinesis, EF 60-65%.  Unsuccessful angioplasty of diagonal.   CARDIOVASCULAR STRESS TEST  05/26/15    stress echo neg for ischemia   CARDIOVASCULAR STRESS TEST  10/04/2017   Myoc perf imaging: normal (EF 59%)   COLONOSCOPY  2008; 06/14/16   2008 Normal.  2018 hyperplastic polyp +internal hemorrhoids.  Recall 2028.   HYDROCELE EXCISION / REPAIR  early 2000's   INGUINAL HERNIA REPAIR Bilateral 1998   PROSTATE BIOPSY     Multiple; all neg.  Prostate MRI 2018 -NO SIGN OF PROSTATE CA.   PTCA     Cath 2011---4 stents--LAD and diagonal.  2014 repeat cath for CP showed no changes.   Scrotal u/s  01/2015   numerous extratesticular cystic lesions bilat, c/w numerous epididymal cysts vs loculated hydrocele--following expectantly.    Home Medications:  Allergies as of 02/02/2021   No Known Allergies      Medication List        Accurate as of February 02, 2021  2:12 PM. If you have any questions, ask your nurse or doctor.          aspirin EC 81 MG tablet Take 1 tablet by mouth daily.   atorvastatin 40 MG tablet Commonly known as: LIPITOR Take 1 tablet (40 mg total) by mouth daily.   Avodart 0.5 MG capsule Generic drug: dutasteride TAKE 1 CAPSULE EVERY OTHER DAY   carvedilol 6.25 MG tablet Commonly known as: COREG Take 1 tablet (6.25 mg total) by mouth 2 (two) times daily.  lisinopril 5 MG tablet Commonly known as: ZESTRIL Take 1 tablet (5 mg total) by mouth daily.   melatonin 5 MG Tabs Take 1 tablet by mouth at bedtime.   MULTIVITAMIN ADULT PO Take 1 capsule by mouth daily.   nitroGLYCERIN 0.4 MG/SPRAY spray Commonly known as: NITROLINGUAL Place 1 spray under the tongue every 5 (five) minutes x 3 doses as needed for chest pain.   omeprazole 20 MG capsule Commonly known as: PRILOSEC Take 1 capsule by mouth daily.   silodosin 8 MG Caps capsule Commonly known as: RAPAFLO Take 8 mg by mouth daily.   ticagrelor 60 MG Tabs tablet Commonly known as: Brilinta Take 1 tablet (60 mg total) by mouth 2 (two) times daily.        Allergies: No Known Allergies  Family  History: Family History  Problem Relation Age of Onset   Hyperlipidemia Mother    Hypertension Mother    Heart disease Mother 35       CABG   Other Mother        Neuroendocrine carcinoma pancreas (solid mass) and liver (diffuse)   Heart disease Father 42       CABG   Hyperlipidemia Father    Hypertension Father    Colon polyps Father    Hypothyroidism Sister    Heart attack Brother 10       MI   Prostate cancer Paternal Uncle    Peripheral vascular disease Brother        Carotid    Stroke Neg Hx    Diabetes Neg Hx     Social History:  reports that he quit smoking about 42 years ago. His smoking use included cigarettes. He has a 0.50 pack-year smoking history. He has never used smokeless tobacco. He reports current alcohol use of about 14.0 standard drinks per week. He reports that he does not use drugs.  ROS: All other review of systems were reviewed and are negative except what is noted above in HPI   Laboratory Data: Lab Results  Component Value Date   WBC 6.3 11/26/2020   HGB 15.1 11/26/2020   HCT 45.7 11/26/2020   MCV 94.0 11/26/2020   PLT 185 11/26/2020    Lab Results  Component Value Date   CREATININE 1.00 11/26/2020    Lab Results  Component Value Date   PSA 4.4 (H) 11/25/2019   PSA 5.20 11/20/2019   PSA 5.61 05/08/2019   PSA 5.61 05/08/2019    Lab Results  Component Value Date   TESTOSTERONE 414 11/25/2019    Lab Results  Component Value Date   HGBA1C 5.5 11/26/2020    Urinalysis    Component Value Date/Time   APPEARANCEUR Clear 07/15/2020 1025   GLUCOSEU Negative 07/15/2020 1025   BILIRUBINUR Negative 07/15/2020 1025   PROTEINUR Negative 07/15/2020 1025   NITRITE Negative 07/15/2020 1025   LEUKOCYTESUR Negative 07/15/2020 1025    Lab Results  Component Value Date   LABMICR Comment 07/15/2020    Pertinent Imaging:  No results found for this or any previous visit.  No results found for this or any previous visit.  No results  found for this or any previous visit.  No results found for this or any previous visit.  No results found for this or any previous visit.  No results found for this or any previous visit.  No results found for this or any previous visit.  No results found for this or any previous visit.   Assessment &  Plan:    1. Elevated PSA -RTC 6 months with PSA  2. Benign prostatic hyperplasia with urinary obstruction -Continue rapaflo and avodart  3. Weak urinary stream Continue rapaflo and avodart   No follow-ups on file.  Nicolette Bang, MD  [facility] [place]

## 2021-02-02 NOTE — Patient Instructions (Signed)
Benign Prostatic Hyperplasia  Benign prostatic hyperplasia (BPH) is an enlarged prostate gland that is caused by the normal aging process and not by cancer. The prostate is a walnut-sized gland that is involved in the production of semen. It is located in front of the rectum and below the bladder. The bladder stores urine and the urethra is the tube that carries the urine out of the body. The prostate may get bigger asa man gets older. An enlarged prostate can press on the urethra. This can make it harder to pass urine. The build-up of urine in the bladder can cause infection. Back pressure and infection may progress to bladder damage and kidney (renal) failure. What are the causes? This condition is part of a normal aging process. However, not all men develop problems from this condition. If the prostate enlarges away from the urethra, urine flow will not be blocked. If it enlarges toward the urethra andcompresses it, there will be problems passing urine. What increases the risk? This condition is more likely to develop in men over the age of 50 years. What are the signs or symptoms? Symptoms of this condition include: Getting up often during the night to urinate. Needing to urinate frequently during the day. Difficulty starting urine flow. Decrease in size and strength of your urine stream. Leaking (dribbling) after urinating. Inability to pass urine. This needs immediate treatment. Inability to completely empty your bladder. Pain when you pass urine. This is more common if there is also an infection. Urinary tract infection (UTI). How is this diagnosed? This condition is diagnosed based on your medical history, a physical exam, and your symptoms. Tests will also be done, such as: A post-void bladder scan. This measures any amount of urine that may remain in your bladder after you finish urinating. A digital rectal exam. In a rectal exam, your health care provider checks your prostate by  putting a lubricated, gloved finger into your rectum to feel the back of your prostate gland. This exam detects the size of your gland and any abnormal lumps or growths. An exam of your urine (urinalysis). A prostate specific antigen (PSA) screening. This is a blood test used to screen for prostate cancer. An ultrasound. This test uses sound waves to electronically produce a picture of your prostate gland. Your health care provider may refer you to a specialist in kidney and prostate diseases (urologist). How is this treated? Once symptoms begin, your health care provider will monitor your condition (active surveillance or watchful waiting). Treatment for this condition will depend on the severity of your condition. Treatment may include: Observation and yearly exams. This may be the only treatment needed if your condition and symptoms are mild. Medicines to relieve your symptoms, including: Medicines to shrink the prostate. Medicines to relax the muscle of the prostate. Surgery in severe cases. Surgery may include: Prostatectomy. In this procedure, the prostate tissue is removed completely through an open incision or with a laparoscope or robotics. Transurethral resection of the prostate (TURP). In this procedure, a tool is inserted through the opening at the tip of the penis (urethra). It is used to cut away tissue of the inner core of the prostate. The pieces are removed through the same opening of the penis. This removes the blockage. Transurethral incision (TUIP). In this procedure, small cuts are made in the prostate. This lessens the prostate's pressure on the urethra. Transurethral microwave thermotherapy (TUMT). This procedure uses microwaves to create heat. The heat destroys and removes a small   amount of prostate tissue. Transurethral needle ablation (TUNA). This procedure uses radio frequencies to destroy and remove a small amount of prostate tissue. Interstitial laser coagulation (ILC).  This procedure uses a laser to destroy and remove a small amount of prostate tissue. Transurethral electrovaporization (TUVP). This procedure uses electrodes to destroy and remove a small amount of prostate tissue. Prostatic urethral lift. This procedure inserts an implant to push the lobes of the prostate away from the urethra. Follow these instructions at home: Take over-the-counter and prescription medicines only as told by your health care provider. Monitor your symptoms for any changes. Contact your health care provider with any changes. Avoid drinking large amounts of liquid before going to bed or out in public. Avoid or reduce how much caffeine or alcohol you drink. Give yourself time when you urinate. Keep all follow-up visits as told by your health care provider. This is important. Contact a health care provider if: You have unexplained back pain. Your symptoms do not get better with treatment. You develop side effects from the medicine you are taking. Your urine becomes very dark or has a bad smell. Your lower abdomen becomes distended and you have trouble passing your urine. Get help right away if: You have a fever or chills. You suddenly cannot urinate. You feel lightheaded, or very dizzy, or you faint. There are large amounts of blood or clots in the urine. Your urinary problems become hard to manage. You develop moderate to severe low back or flank pain. The flank is the side of your body between the ribs and the hip. These symptoms may represent a serious problem that is an emergency. Do not wait to see if the symptoms will go away. Get medical help right away. Call your local emergency services (911 in the [place] Do not drive yourself to the hospital. Summary Benign prostatic hyperplasia (BPH) is an enlarged prostate that is caused by the normal aging process and not by cancer. An enlarged prostate can press on the urethra. This can make it hard to pass urine. This  condition is part of a normal aging process and is more likely to develop in men over the age of 50 years. Get help right away if you suddenly cannot urinate. This information is not intended to replace advice given to you by your health care provider. Make sure you discuss any questions you have with your healthcare provider. Document Revised: 01/30/2020 Document Reviewed: 01/30/2020 Elsevier Patient Education  2022 [employer] Inc[employer]  

## 2021-02-26 ENCOUNTER — Other Ambulatory Visit: Payer: Self-pay | Admitting: Cardiology

## 2021-03-24 ENCOUNTER — Other Ambulatory Visit: Payer: Self-pay | Admitting: *Deleted

## 2021-03-24 IMAGING — DX DG KNEE COMPLETE 4+V*R*
2 series · 2 of 2 positions shown · non-contrast
Comparison: None

CLINICAL DATA: Right knee pain

EXAM:
RIGHT KNEE - COMPLETE 4+ VIEW

[knee lat]
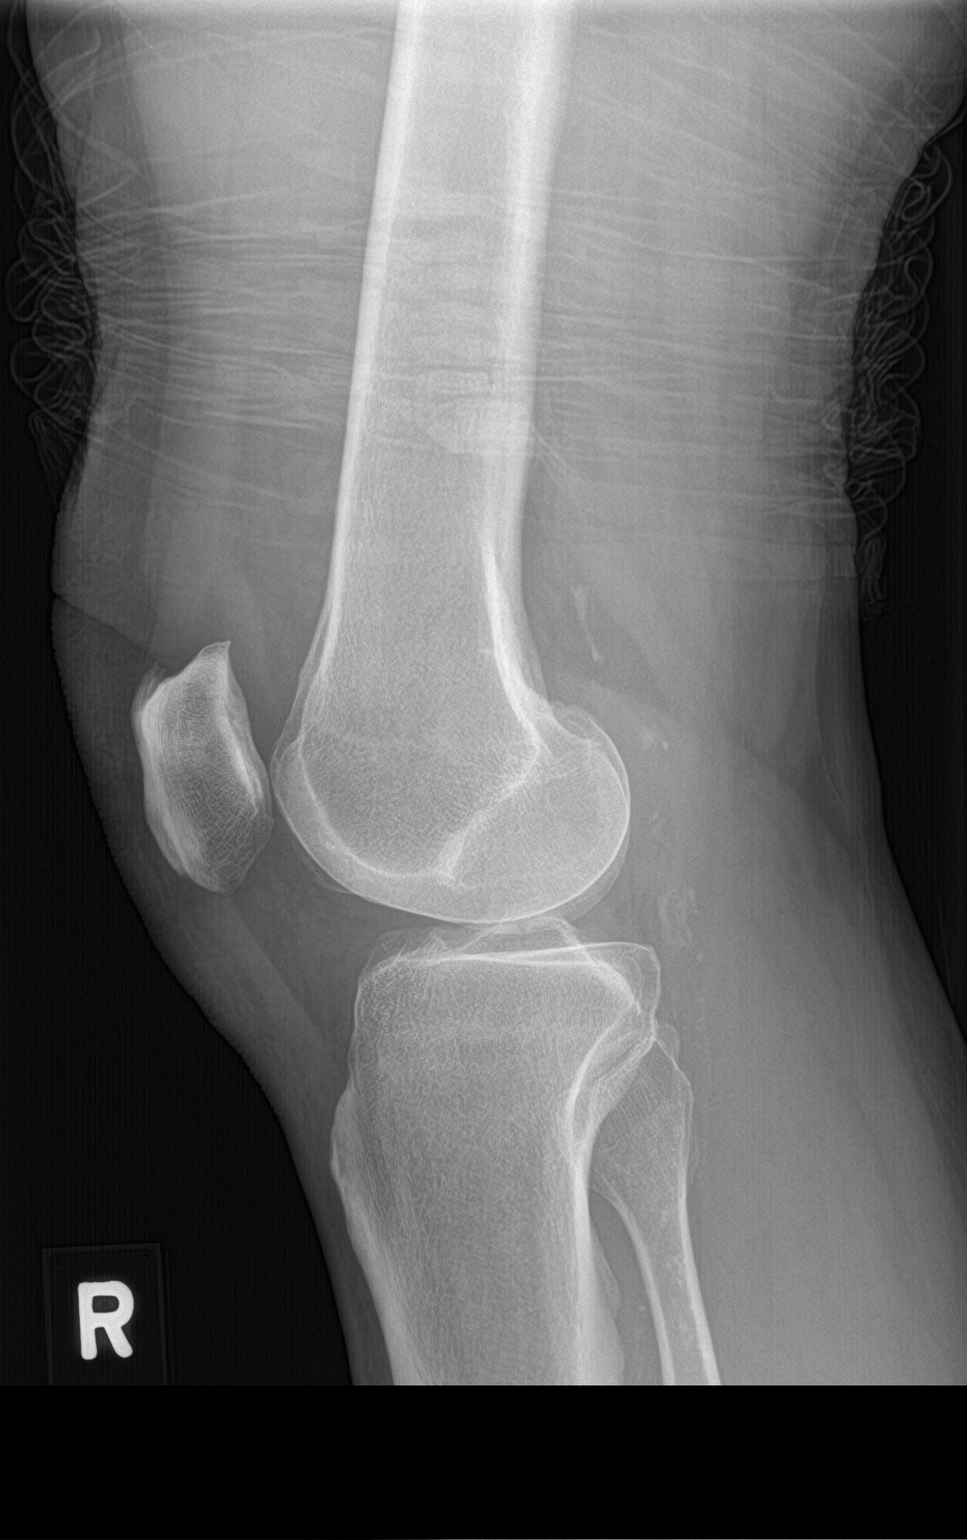

[knee obl]
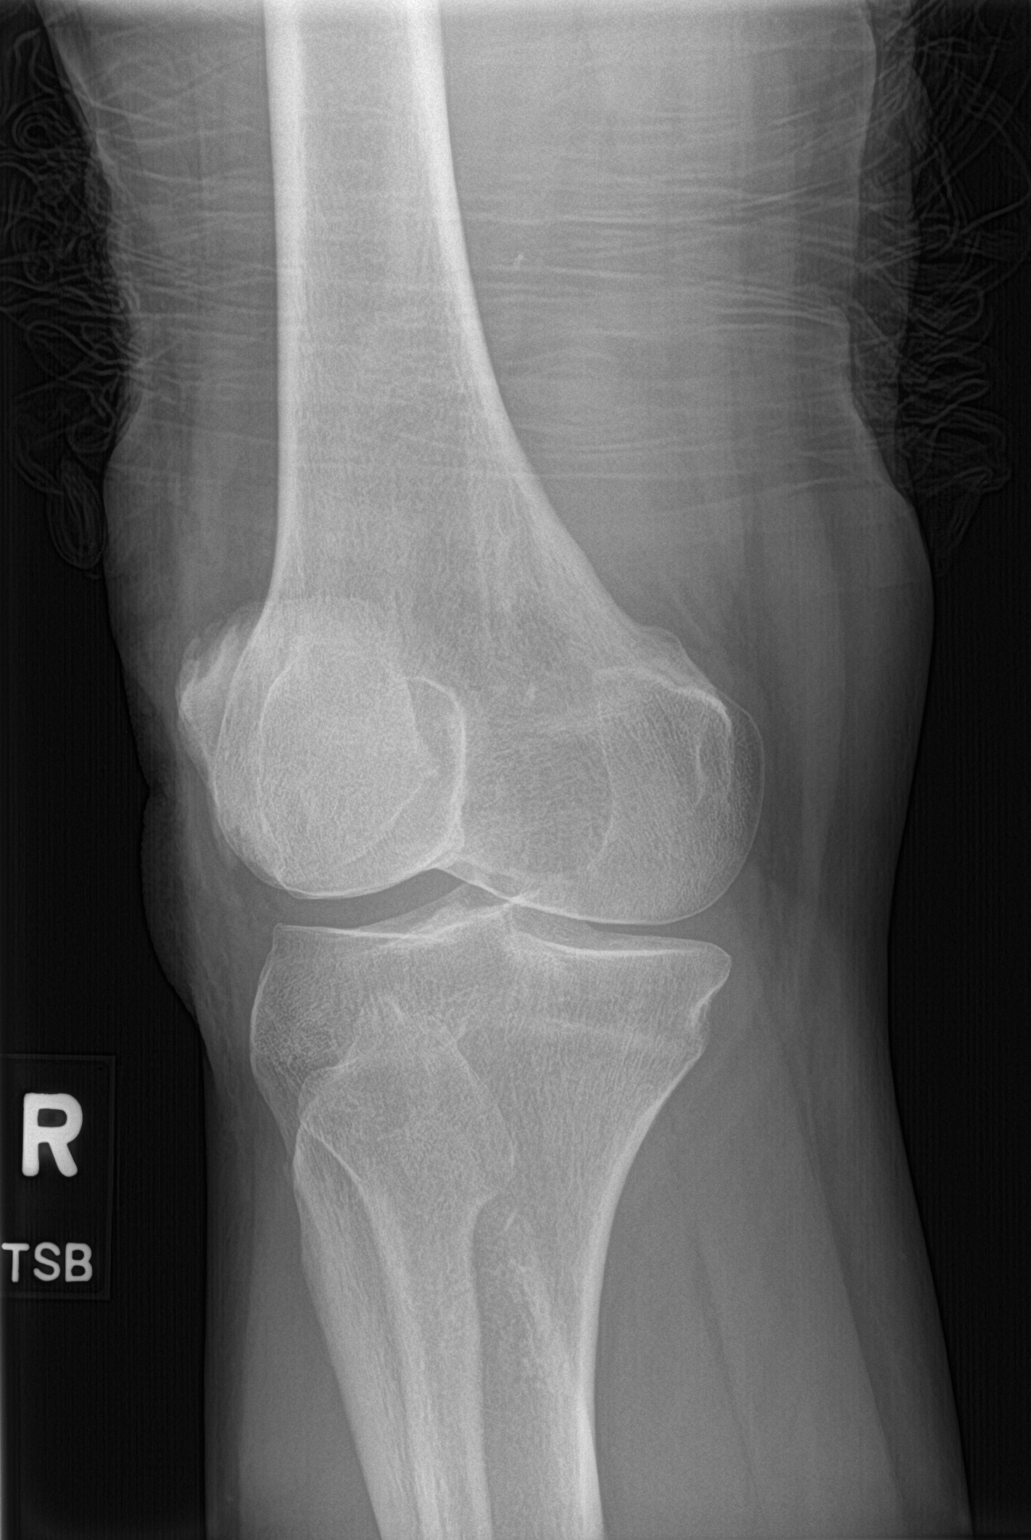

[2 of 2 positions shown; findings below may reference images not displayed]

FINDINGS: No acute bony abnormality. Specifically, no fracture, subluxation,
or dislocation. Joint spaces maintained. No joint effusion.
IMPRESSION: No acute bony abnormality.

## 2021-03-24 MED ORDER — CARVEDILOL 6.25 MG PO TABS: 6.2500 mg | ORAL_TABLET | Freq: Two times a day (BID) | ORAL | 1 refills | Status: DC

## 2021-03-24 MED ORDER — ATORVASTATIN CALCIUM 40 MG PO TABS
40.0000 mg | ORAL_TABLET | Freq: Every day | ORAL | 1 refills | Status: DC
Start: 2021-03-24 — End: 2021-12-06

## 2021-04-02 ENCOUNTER — Ambulatory Visit: Payer: 59 | Admitting: Urology

## 2021-04-02 ENCOUNTER — Encounter: Payer: Self-pay | Admitting: Urology

## 2021-04-02 ENCOUNTER — Other Ambulatory Visit: Payer: Self-pay

## 2021-04-02 VITALS — BP 136/84 | HR 60 | Temp 97.8°F | Wt 191.0 lb

## 2021-04-02 DIAGNOSIS — N3000 Acute cystitis without hematuria: Secondary | ICD-10-CM

## 2021-04-02 DIAGNOSIS — R3912 Poor urinary stream: Secondary | ICD-10-CM | POA: Diagnosis not present

## 2021-04-02 DIAGNOSIS — N138 Other obstructive and reflux uropathy: Secondary | ICD-10-CM | POA: Diagnosis not present

## 2021-04-02 DIAGNOSIS — N401 Enlarged prostate with lower urinary tract symptoms: Secondary | ICD-10-CM | POA: Diagnosis not present

## 2021-04-02 LAB — MICROSCOPIC EXAMINATION
Bacteria, UA: NONE SEEN
Epithelial Cells (non renal): NONE SEEN /hpf (ref 0–10)
Renal Epithel, UA: NONE SEEN /hpf
WBC, UA: >30 /hpf — AB (ref 0–5)

## 2021-04-02 LAB — URINALYSIS, ROUTINE W REFLEX MICROSCOPIC
Bilirubin, UA: NEGATIVE
Glucose, UA: NEGATIVE
Ketones, UA: NEGATIVE
Nitrite, UA: NEGATIVE
Protein,UA: NEGATIVE
Specific Gravity, UA: 1.01 (ref 1.005–1.030)
Urobilinogen, Ur: 0.2 mg/dL (ref 0.2–1.0)
pH, UA: 6 (ref 5.0–7.5)

## 2021-04-02 MED ORDER — SULFAMETHOXAZOLE-TRIMETHOPRIM 800-160 MG PO TABS: 1.0000 | ORAL_TABLET | Freq: Two times a day (BID) | ORAL | 0 refills | Status: DC

## 2021-04-02 NOTE — Progress Notes (Signed)
Urological Symptom Review  Patient is experiencing the following symptoms: Frequent urination Burning/pain with urination Stream starts and stops Urinary tract infection   Review of Systems  Gastrointestinal (upper)  : Negative for upper GI symptoms  Gastrointestinal (lower) : Negative for lower GI symptoms  Constitutional : Negative for symptoms  Skin: Negative for skin symptoms  Eyes: Negative for eye symptoms  Ear/Nose/Throat : Negative for Ear/Nose/Throat symptoms  Hematologic/Lymphatic: Negative for Hematologic/Lymphatic symptoms  Cardiovascular : Negative for cardiovascular symptoms  Respiratory : Negative for respiratory symptoms  Endocrine: Negative for endocrine symptoms  Musculoskeletal: Negative for musculoskeletal symptoms  Neurological: Negative for neurological symptoms  Psychologic: Negative for psychiatric symptoms

## 2021-04-02 NOTE — Patient Instructions (Signed)
Urinary Tract Infection, Adult °A urinary tract infection (UTI) is an infection of any part of the urinary tract. The urinary tract includes: °The kidneys. °The ureters. °The bladder. °The urethra. °These organs make, store, and get rid of pee (urine) in the body. °What are the causes? °This infection is caused by germs (bacteria) in your genital area. These germs grow and cause swelling (inflammation) of your urinary tract. °What increases the risk? °The following factors may make you more likely to develop this condition: °Using a small, thin tube (catheter) to drain pee. °Not being able to control when you pee or poop (incontinence). °Being male. If you are male, these things can increase the risk: °Using these methods to prevent pregnancy: °A medicine that kills sperm (spermicide). °A device that blocks sperm (diaphragm). °Having low levels of a male hormone (estrogen). °Being pregnant. °You are more likely to develop this condition if: °You have genes that add to your risk. °You are sexually active. °You take antibiotic medicines. °You have trouble peeing because of: °A prostate that is bigger than normal, if you are male. °A blockage in the part of your body that drains pee from the bladder. °A kidney stone. °A nerve condition that affects your bladder. °Not getting enough to drink. °Not peeing often enough. °You have other conditions, such as: °Diabetes. °A weak disease-fighting system (immune system). °Sickle cell disease. °Gout. °Injury of the spine. °What are the signs or symptoms? °Symptoms of this condition include: °Needing to pee right away. °Peeing small amounts often. °Pain or burning when peeing. °Blood in the pee. °Pee that smells bad or not like normal. °Trouble peeing. °Pee that is cloudy. °Fluid coming from the vagina, if you are male. °Pain in the belly or lower back. °Other symptoms include: °Vomiting. °Not feeling hungry. °Feeling mixed up (confused). This may be the first symptom in  older adults. °Being tired and grouchy (irritable). °A fever. °Watery poop (diarrhea). °How is this treated? °Taking antibiotic medicine. °Taking other medicines. °Drinking enough water. °In some cases, you may need to see a specialist. °Follow these instructions at home: °Medicines °Take over-the-counter and prescription medicines only as told by your doctor. °If you were prescribed an antibiotic medicine, take it as told by your doctor. Do not stop taking it even if you start to feel better. °General instructions °Make sure you: °Pee until your bladder is empty. °Do not hold pee for a long time. °Empty your bladder after sex. °Wipe from front to back after peeing or pooping if you are a male. Use each tissue one time when you wipe. °Drink enough fluid to keep your pee pale yellow. °Keep all follow-up visits. °Contact a doctor if: °You do not get better after 1-2 days. °Your symptoms go away and then come back. °Get help right away if: °You have very bad back pain. °You have very bad pain in your lower belly. °You have a fever. °You have chills. °You feeling like you will vomit or you vomit. °Summary °A urinary tract infection (UTI) is an infection of any part of the urinary tract. °This condition is caused by germs in your genital area. °There are many risk factors for a UTI. °Treatment includes antibiotic medicines. °Drink enough fluid to keep your pee pale yellow. °This information is not intended to replace advice given to you by your health care provider. Make sure you discuss any questions you have with your health care provider. °Document Revised: 01/03/2020 Document Reviewed: 01/03/2020 °Elsevier Patient Education © 2022   [employer] ° °

## 2021-04-02 NOTE — Progress Notes (Signed)
04/02/2021 1:10 PM   Ricky Chavez 1956-05-14 [id number]  Referring provider: [name], MD [address] [place],  [place] [address]  Dysuria   HPI: Ricky Chavez is a 65yo here for evaluation of dysuria. Starting 2 weeks ago he developed new onset dysuria, urinary frequency, and pelvic pain. US shows WBCs and bacteria. IPSS 16 QOL 6. Urine stream weaker. He has strainign to urinate. No hematuria. No other complaints today   PMH: Past Medical History:  Diagnosis Date   Allergic rhinitis    BPH with obstruction/lower urinary tract symptoms    flomax and finasteride qod per urol   Coronary artery disease    With preserved EF.  Needs DAPT lifetime due to high risk status.   COVID 09/01/2020   Elevated PSA onset @ 2009   Neg bx x 3.  MRI c/w bph only.  PSAs stable as of 11/2019. Dr. Demetrios Chavez   GERD (gastroesophageal reflux disease)    Hearing loss of both ears    History of myocardial infarction 2011   Hyperlipidemia    Hypertension    Insomnia    Organic impotence    Palpitations 05/2018   PVCs   Sciatica    Piriformis syndrome likely--improving with gabapentin as of 04/2017.  May need MRI L spine and/or emg/NCS   Squamous cell carcinoma     Surgical History: Past Surgical History:  Procedure Laterality Date   CARDIAC CATHETERIZATION  01/31/13   Stents to LAD patent; small diagonal stent with 90% ostial stenosis--good overall LV function with mild anterior and focal inferoapical hypokinesis, EF 60-65%.  Unsuccessful angioplasty of diagonal.   CARDIOVASCULAR STRESS TEST  05/26/15   stress echo neg for ischemia   CARDIOVASCULAR STRESS TEST  10/04/2017   Myoc perf imaging: normal (EF 59%)   COLONOSCOPY  2008; 06/14/16   2008 Normal.  2018 hyperplastic polyp +internal hemorrhoids.  Recall 2028.   HYDROCELE EXCISION / REPAIR  early 2000's   INGUINAL HERNIA REPAIR Bilateral 1998   PROSTATE BIOPSY     Multiple; all neg.  Prostate MRI 2018 -NO SIGN OF  PROSTATE CA.   PTCA     Cath 2011---4 stents--LAD and diagonal.  2014 repeat cath for CP showed no changes.   Scrotal u/s  01/2015   numerous extratesticular cystic lesions bilat, c/w numerous epididymal cysts vs loculated hydrocele--following expectantly.    Home Medications:  Allergies as of 04/02/2021   No Known Allergies      Medication List        Accurate as of April 02, 2021  1:10 PM. If you have any questions, ask your nurse or doctor.          aspirin EC 81 MG tablet Take 1 tablet by mouth daily.   atorvastatin 40 MG tablet Commonly known as: LIPITOR Take 1 tablet (40 mg total) by mouth daily.   Avodart 0.5 MG capsule Generic drug: dutasteride TAKE 1 CAPSULE EVERY OTHER DAY   carvedilol 6.25 MG tablet Commonly known as: COREG Take 1 tablet (6.25 mg total) by mouth 2 (two) times daily.   lisinopril 5 MG tablet Commonly known as: ZESTRIL Take 1 tablet (5 mg total) by mouth daily.   melatonin 5 MG Tabs Take 1 tablet by mouth at bedtime.   MULTIVITAMIN ADULT PO Take 1 capsule by mouth daily.   nitroGLYCERIN 0.4 MG/SPRAY spray Commonly known as: NITROLINGUAL Place 1 spray under the tongue every 5 (five) minutes x 3 doses  as needed for chest pain.   omeprazole 20 MG capsule Commonly known as: PRILOSEC Take 1 capsule by mouth daily.   silodosin 8 MG Caps capsule Commonly known as: RAPAFLO Take 1 capsule (8 mg total) by mouth daily.   ticagrelor 60 MG Tabs tablet Commonly known as: Brilinta Take 1 tablet (60 mg total) by mouth 2 (two) times daily.        Allergies: No Known Allergies  Family History: Family History  Problem Relation Age of Onset   Hyperlipidemia Mother    Hypertension Mother    Heart disease Mother 1       CABG   Other Mother        Neuroendocrine carcinoma pancreas (solid mass) and liver (diffuse)   Heart disease Father 69       CABG   Hyperlipidemia Father    Hypertension Father    Colon polyps Father     Hypothyroidism Sister    Heart attack Brother 29       MI   Prostate cancer Paternal Uncle    Peripheral vascular disease Brother        Carotid    Stroke Neg Hx    Diabetes Neg Hx     Social History:  reports that he quit smoking about 42 years ago. His smoking use included cigarettes. He has a 0.50 pack-year smoking history. He has never used smokeless tobacco. He reports current alcohol use of about 14.0 standard drinks per week. He reports that he does not use drugs.  ROS: All other review of systems were reviewed and are negative except what is noted above in HPI  Physical Exam: BP 136/84   Pulse 60   Temp 97.8 F (36.6 C)   Wt 191 lb (86.6 kg)   BMI 28.83 kg/m   Constitutional:  Alert and oriented, No acute distress. HEENT: [place] AT, moist mucus membranes.  Trachea midline, no masses. Cardiovascular: No clubbing, cyanosis, or edema. Respiratory: Normal respiratory effort, no increased work of breathing. GI: Abdomen is soft, nontender, nondistended, no abdominal masses GU: No CVA tenderness.  Lymph: No cervical or inguinal lymphadenopathy. Skin: No rashes, bruises or suspicious lesions. Neurologic: Grossly intact, no focal deficits, moving all 4 extremities. Psychiatric: Normal mood and affect.  Laboratory Data: Lab Results  Component Value Date   WBC 6.3 11/26/2020   HGB 15.1 11/26/2020   HCT 45.7 11/26/2020   MCV 94.0 11/26/2020   PLT 185 11/26/2020    Lab Results  Component Value Date   CREATININE 1.00 11/26/2020    Lab Results  Component Value Date   PSA 4.4 (H) 11/25/2019   PSA 5.20 11/20/2019   PSA 5.61 05/08/2019   PSA 5.61 05/08/2019    Lab Results  Component Value Date   TESTOSTERONE 414 11/25/2019    Lab Results  Component Value Date   HGBA1C 5.5 11/26/2020    Urinalysis    Component Value Date/Time   APPEARANCEUR Clear 07/15/2020 1025   GLUCOSEU Negative 07/15/2020 1025   BILIRUBINUR Negative 07/15/2020 1025   PROTEINUR Negative  07/15/2020 1025   NITRITE Negative 07/15/2020 1025   LEUKOCYTESUR Negative 07/15/2020 1025    Lab Results  Component Value Date   LABMICR Comment 07/15/2020    Pertinent Imaging:  No results found for this or any previous visit.  No results found for this or any previous visit.  No results found for this or any previous visit.  No results found for this or any previous visit.  No results found for this or any previous visit.  No results found for this or any previous visit.  No results found for this or any previous visit.  No results found for this or any previous visit.   Assessment & Plan:    1. Weak urinary stream -likely related UTI. We will start bactrim DS BID for 7 days - Urinalysis, Routine w reflex microscopic  2. Benign prostatic hyperplasia with urinary obstruction -rapaflo 8mg   - Urinalysis, Routine w reflex microscopic   No follow-ups on file.  Nicolette Bang, MD  [facility] [place]

## 2021-04-04 LAB — URINE CULTURE

## 2021-05-28 ENCOUNTER — Ambulatory Visit: Payer: 59 | Admitting: Family Medicine

## 2021-06-01 ENCOUNTER — Ambulatory Visit: Payer: 59 | Admitting: Family Medicine

## 2021-06-06 DIAGNOSIS — Z87828 Personal history of other (healed) physical injury and trauma: Secondary | ICD-10-CM

## 2021-06-06 HISTORY — DX: Personal history of other (healed) physical injury and trauma: Z87.828

## 2021-06-22 ENCOUNTER — Encounter: Payer: Self-pay | Admitting: Family Medicine

## 2021-06-22 ENCOUNTER — Ambulatory Visit: Payer: 59 | Admitting: Family Medicine

## 2021-06-22 ENCOUNTER — Other Ambulatory Visit: Payer: Self-pay

## 2021-06-22 VITALS — BP 93/65 | HR 53 | Temp 97.5°F | Ht 68.25 in | Wt 192.8 lb

## 2021-06-22 DIAGNOSIS — E663 Overweight: Secondary | ICD-10-CM

## 2021-06-22 DIAGNOSIS — Z23 Encounter for immunization: Secondary | ICD-10-CM

## 2021-06-22 DIAGNOSIS — R7301 Impaired fasting glucose: Secondary | ICD-10-CM

## 2021-06-22 DIAGNOSIS — I10 Essential (primary) hypertension: Secondary | ICD-10-CM | POA: Diagnosis not present

## 2021-06-22 DIAGNOSIS — E7800 Pure hypercholesterolemia, unspecified: Secondary | ICD-10-CM | POA: Diagnosis not present

## 2021-06-22 DIAGNOSIS — Z713 Dietary counseling and surveillance: Secondary | ICD-10-CM

## 2021-06-22 MED ORDER — NITROGLYCERIN 0.4 MG/SPRAY TL SOLN: 1.0000 | 1 refills | Status: DC | PRN

## 2021-06-22 NOTE — Progress Notes (Signed)
OFFICE VISIT  06/22/2021  CC:  Chief Complaint  Patient presents with   Follow-up    RCI; not fasting   HPI:    Patient is a 66 y.o. male who presents for 6 mo f/u f/u HTN, HLD, and CAD. A/P as of last visit: "1) HTN: very good control on coreg 6.25 bid and lisinopril 5 qd. Lytes/cr today.   2) HLD: tolerating atorva 40 qd. FLP and hepatic panel today.   3) Prediabetes: needs to work more on improving diet and exercise level. A1c today.   4) CAD; asymptomatic.  Statin and BB. DAPT indefinitely per cards.   5) Health maintenance exam: Reviewed age and gender appropriate health maintenance issues (prudent diet, regular exercise, health risks of tobacco and excessive alcohol, use of seatbelts, fire alarms in home, use of sunscreen).  Also reviewed age and gender appropriate health screening as well as vaccine recommendations. Vaccines: ALL UTD. Labs: fasting HP + Hba1c. Prostate ca screening: hx of elev PSA, prostate bx neg, followed by urol. Colon ca screening: recall 2028."  INTERIM HX: All labs normal last visit. Ricky Chavez is feeling well. Is frustrated with his weight, says his biggest problem is overeating particularly at night.  He drinks some wine most days as well. He typically exercises pretty well on his treadmill most days with the exception of a couple months over the holidays.  Home blood pressures typically in low normal range.  No dizziness, no fatigue.     Past Medical History:  Diagnosis Date   Allergic rhinitis    BPH with obstruction/lower urinary tract symptoms    flomax and finasteride qod per urol   Coronary artery disease    With preserved EF.  Needs DAPT lifetime due to high risk status.   COVID 09/01/2020   Elevated PSA onset @ 2009   Neg bx x 3.  MRI c/w bph only.  PSAs stable as of 11/2019. Dr. Demetrios Isaacs   GERD (gastroesophageal reflux disease)    Hearing loss of both ears    History of myocardial infarction 2011   Hyperlipidemia     Hypertension    Insomnia    Organic impotence    Palpitations 05/2018   PVCs   Sciatica    Piriformis syndrome likely--improving with gabapentin as of 04/2017.  May need MRI L spine and/or emg/NCS   Squamous cell carcinoma     Past Surgical History:  Procedure Laterality Date   CARDIAC CATHETERIZATION  01/31/13   Stents to LAD patent; small diagonal stent with 90% ostial stenosis--good overall LV function with mild anterior and focal inferoapical hypokinesis, EF 60-65%.  Unsuccessful angioplasty of diagonal.   CARDIOVASCULAR STRESS TEST  05/26/15   stress echo neg for ischemia   CARDIOVASCULAR STRESS TEST  10/04/2017   Myoc perf imaging: normal (EF 59%)   COLONOSCOPY  2008; 06/14/16   2008 Normal.  2018 hyperplastic polyp +internal hemorrhoids.  Recall 2028.   HYDROCELE EXCISION / REPAIR  early 2000's   INGUINAL HERNIA REPAIR Bilateral 1998   PROSTATE BIOPSY     Multiple; all neg.  Prostate MRI 2018 -NO SIGN OF PROSTATE CA.   PTCA     Cath 2011---4 stents--LAD and diagonal.  2014 repeat cath for CP showed no changes.   Scrotal u/s  01/2015   numerous extratesticular cystic lesions bilat, c/w numerous epididymal cysts vs loculated hydrocele--following expectantly.    Outpatient Medications Prior to Visit  Medication Sig Dispense Refill   aspirin EC 81 MG tablet  Take 1 tablet by mouth daily.     atorvastatin (LIPITOR) 40 MG tablet Take 1 tablet (40 mg total) by mouth daily. 90 tablet 1   AVODART 0.5 MG capsule TAKE 1 CAPSULE EVERY OTHER DAY 30 capsule 3   carvedilol (COREG) 6.25 MG tablet Take 1 tablet (6.25 mg total) by mouth 2 (two) times daily. 180 tablet 1   lisinopril (ZESTRIL) 5 MG tablet Take 1 tablet (5 mg total) by mouth daily. 90 tablet 3   melatonin 5 MG TABS Take 1 tablet by mouth at bedtime.     Multiple Vitamins-Minerals (MULTIVITAMIN ADULT PO) Take 1 capsule by mouth daily.     omeprazole (PRILOSEC) 20 MG capsule Take 1 capsule by mouth daily.     silodosin (RAPAFLO)  8 MG CAPS capsule Take 1 capsule (8 mg total) by mouth daily. 90 capsule 3   ticagrelor (BRILINTA) 60 MG TABS tablet Take 1 tablet (60 mg total) by mouth 2 (two) times daily. 180 tablet 2   nitroGLYCERIN (NITROLINGUAL) 0.4 MG/SPRAY spray Place 1 spray under the tongue every 5 (five) minutes x 3 doses as needed for chest pain. (Patient not taking: Reported on 06/22/2021) 12 g 1   sulfamethoxazole-trimethoprim (BACTRIM DS) 800-160 MG tablet Take 1 tablet by mouth every 12 (twelve) hours. 14 tablet 0   No facility-administered medications prior to visit.    No Known Allergies  ROS As per HPI  PE: Vitals with BMI 06/22/2021 04/02/2021 11/26/2020  Height 5' 8.25" - 5' 8.25"  Weight 192 lbs 13 oz 191 lbs 188 lbs 13 oz  BMI 16.10 - 96.04  Systolic 93 540 97  Diastolic 65 84 62  Pulse 53 60 58     Physical Exam  Gen: Alert, well appearing.  Patient is oriented to person, place, time, and situation. AFFECT: pleasant, lucid thought and speech. CV: RRR, no m/r/g.   LUNGS: CTA bilat, nonlabored resps, good aeration in all lung fields. EXT: no clubbing or cyanosis.  no edema.    LABS:  Last CBC Lab Results  Component Value Date   WBC 6.3 11/26/2020   HGB 15.1 11/26/2020   HCT 45.7 11/26/2020   MCV 94.0 11/26/2020   MCH 31.1 11/26/2020   RDW 13.0 11/26/2020   PLT 185 98/04/9146   Last metabolic panel Lab Results  Component Value Date   GLUCOSE 104 (H) 11/26/2020   NA 143 11/26/2020   K 4.6 11/26/2020   CL 107 11/26/2020   CO2 22 11/26/2020   BUN 23 11/26/2020   CREATININE 1.00 11/26/2020   CALCIUM 9.6 11/26/2020   PROT 6.7 11/26/2020   ALBUMIN 3.9 09/01/2016   BILITOT 0.8 11/26/2020   ALKPHOS 72 09/01/2016   AST 26 11/26/2020   ALT 27 11/26/2020   Last lipids Lab Results  Component Value Date   CHOL 137 11/26/2020   HDL 49 11/26/2020   LDLCALC 69 11/26/2020   TRIG 111 11/26/2020   CHOLHDL 2.8 11/26/2020   Last hemoglobin A1c Lab Results  Component Value Date    HGBA1C 5.5 11/26/2020   Last thyroid functions Lab Results  Component Value Date   TSH 1.85 11/26/2020   IMPRESSION AND PLAN:  #1 hypertension.  Blood pressure great.  Continue lisinopril 5 mg a day and carvedilol 6.25 mg twice daily. Electrolytes and creatinine today.  2.  Hypercholesterolemia, well controlled on atorvastatin 40 mg a day. Last LDL about 6 months ago was 69.  He is not fasting today.  Plan repeat  cholesterol in 6 months.  3.  Overweight, BMI 29. He is interested in appetite suppressant but phentermine is contraindicated in him due to his history of cardiac disease. I asked him to check with his insurance about coverage for Ozempic, Mounjaro, or Victoza.  4. CAD; asymptomatic.  Statin and BB. DAPT indefinitely per cards.  An After Visit Summary was printed and given to the patient.  FOLLOW UP: Return in about 6 months (around 12/20/2021) for annual CPE (fasting).  Signed:  Crissie Sickles, MD           06/22/2021

## 2021-06-22 NOTE — Patient Instructions (Signed)
Call insurer an ask if any of the following are covered: Ozempic, mounjaro, victoza

## 2021-06-23 LAB — COMPREHENSIVE METABOLIC PANEL
AG Ratio: 1.6 (calc) (ref 1.0–2.5)
ALT: 32 U/L (ref 9–46)
AST: 26 U/L (ref 10–35)
Albumin: 4.4 g/dL (ref 3.6–5.1)
Alkaline phosphatase (APISO): 68 U/L (ref 35–144)
BUN: 19 mg/dL (ref 7–25)
CO2: 26 mmol/L (ref 20–32)
Calcium: 9.7 mg/dL (ref 8.6–10.3)
Chloride: 103 mmol/L (ref 98–110)
Creat: 1.07 mg/dL (ref 0.70–1.35)
Globulin: 2.7 g/dL (calc) (ref 1.9–3.7)
Glucose, Bld: 102 mg/dL — ABNORMAL HIGH (ref 65–99)
Potassium: 4.7 mmol/L (ref 3.5–5.3)
Sodium: 139 mmol/L (ref 135–146)
Total Bilirubin: 0.8 mg/dL (ref 0.2–1.2)
Total Protein: 7.1 g/dL (ref 6.1–8.1)

## 2021-07-16 ENCOUNTER — Other Ambulatory Visit: Payer: Self-pay

## 2021-07-16 ENCOUNTER — Other Ambulatory Visit: Payer: 59

## 2021-07-16 DIAGNOSIS — R972 Elevated prostate specific antigen [PSA]: Secondary | ICD-10-CM

## 2021-07-17 LAB — PSA: Prostate Specific Ag, Serum: 6.3 ng/mL — ABNORMAL HIGH (ref 0.0–4.0)

## 2021-07-23 ENCOUNTER — Ambulatory Visit: Payer: 59 | Admitting: Urology

## 2021-07-23 ENCOUNTER — Other Ambulatory Visit: Payer: Self-pay

## 2021-07-23 VITALS — BP 124/72 | HR 56

## 2021-07-23 DIAGNOSIS — N401 Enlarged prostate with lower urinary tract symptoms: Secondary | ICD-10-CM

## 2021-07-23 DIAGNOSIS — R3912 Poor urinary stream: Secondary | ICD-10-CM | POA: Diagnosis not present

## 2021-07-23 DIAGNOSIS — N138 Other obstructive and reflux uropathy: Secondary | ICD-10-CM | POA: Diagnosis not present

## 2021-07-23 DIAGNOSIS — R972 Elevated prostate specific antigen [PSA]: Secondary | ICD-10-CM

## 2021-07-23 LAB — URINALYSIS, ROUTINE W REFLEX MICROSCOPIC
Bilirubin, UA: NEGATIVE
Glucose, UA: NEGATIVE
Ketones, UA: NEGATIVE
Leukocytes,UA: NEGATIVE
Nitrite, UA: NEGATIVE
Protein,UA: NEGATIVE
RBC, UA: NEGATIVE
Specific Gravity, UA: 1.01 (ref 1.005–1.030)
Urobilinogen, Ur: 0.2 mg/dL (ref 0.2–1.0)
pH, UA: 5.5 (ref 5.0–7.5)

## 2021-07-23 LAB — BLADDER SCAN AMB NON-IMAGING: Scan Result: 286

## 2021-07-23 NOTE — Progress Notes (Signed)
post void residual= 286

## 2021-07-23 NOTE — Progress Notes (Signed)
07/23/2021 12:44 PM   Ricky Chavez Sep 29, 1955 [id number]  Referring provider: [name], MD [address] [place],  [place] [address]  Followup Elevated PSA and BPH   HPI: Mr Wallen is a 66yo here for followup for BPH and elevated PSA. PSA decreased from 8.7 to 6.3. PVR 286cc.  He is on avodart and rapaflo 8mg . IPSS 15 QOl 3. Urine stream fair. He has nocturia 0x.    PMH: Past Medical History:  Diagnosis Date   Allergic rhinitis    BPH with obstruction/lower urinary tract symptoms    flomax and finasteride qod per urol   Coronary artery disease    With preserved EF.  Needs DAPT lifetime due to high risk status.   COVID 09/01/2020   Elevated PSA onset @ 2009   Neg bx x 3.  MRI c/w bph only.  PSAs stable as of 11/2019. Dr. Demetrios Isaacs   GERD (gastroesophageal reflux disease)    Hearing loss of both ears    History of myocardial infarction 2011   Hyperlipidemia    Hypertension    Insomnia    Organic impotence    Palpitations 05/2018   PVCs   Sciatica    Piriformis syndrome likely--improving with gabapentin as of 04/2017.  May need MRI L spine and/or emg/NCS   Squamous cell carcinoma     Surgical History: Past Surgical History:  Procedure Laterality Date   CARDIAC CATHETERIZATION  01/31/13   Stents to LAD patent; small diagonal stent with 90% ostial stenosis--good overall LV function with mild anterior and focal inferoapical hypokinesis, EF 60-65%.  Unsuccessful angioplasty of diagonal.   CARDIOVASCULAR STRESS TEST  05/26/15   stress echo neg for ischemia   CARDIOVASCULAR STRESS TEST  10/04/2017   Myoc perf imaging: normal (EF 59%)   COLONOSCOPY  2008; 06/14/16   2008 Normal.  2018 hyperplastic polyp +internal hemorrhoids.  Recall 2028.   HYDROCELE EXCISION / REPAIR  early 2000's   INGUINAL HERNIA REPAIR Bilateral 1998   PROSTATE BIOPSY     Multiple; all neg.  Prostate MRI 2018 -NO SIGN OF PROSTATE CA.   PTCA     Cath 2011---4 stents--LAD and  diagonal.  2014 repeat cath for CP showed no changes.   Scrotal u/s  01/2015   numerous extratesticular cystic lesions bilat, c/w numerous epididymal cysts vs loculated hydrocele--following expectantly.    Home Medications:  Allergies as of 07/23/2021   No Known Allergies      Medication List        Accurate as of July 23, 2021 12:44 PM. If you have any questions, ask your nurse or doctor.          aspirin EC 81 MG tablet Take 1 tablet by mouth daily.   atorvastatin 40 MG tablet Commonly known as: LIPITOR Take 1 tablet (40 mg total) by mouth daily.   Avodart 0.5 MG capsule Generic drug: dutasteride TAKE 1 CAPSULE EVERY OTHER DAY   carvedilol 6.25 MG tablet Commonly known as: COREG Take 1 tablet (6.25 mg total) by mouth 2 (two) times daily.   lisinopril 5 MG tablet Commonly known as: ZESTRIL Take 1 tablet (5 mg total) by mouth daily.   melatonin 5 MG Tabs Take 1 tablet by mouth at bedtime.   MULTIVITAMIN ADULT PO Take 1 capsule by mouth daily.   nitroGLYCERIN 0.4 MG/SPRAY spray Commonly known as: NITROLINGUAL Place 1 spray under the tongue every 5 (five) minutes x 3 doses as needed for  chest pain.   omeprazole 20 MG capsule Commonly known as: PRILOSEC Take 1 capsule by mouth daily.   silodosin 8 MG Caps capsule Commonly known as: RAPAFLO Take 1 capsule (8 mg total) by mouth daily.   ticagrelor 60 MG Tabs tablet Commonly known as: Brilinta Take 1 tablet (60 mg total) by mouth 2 (two) times daily.        Allergies: No Known Allergies  Family History: Family History  Problem Relation Age of Onset   Hyperlipidemia Mother    Hypertension Mother    Heart disease Mother 40       CABG   Other Mother        Neuroendocrine carcinoma pancreas (solid mass) and liver (diffuse)   Heart disease Father 70       CABG   Hyperlipidemia Father    Hypertension Father    Colon polyps Father    Hypothyroidism Sister    Heart attack Brother 49       MI    Prostate cancer Paternal Uncle    Peripheral vascular disease Brother        Carotid    Stroke Neg Hx    Diabetes Neg Hx     Social History:  reports that he quit smoking about 43 years ago. His smoking use included cigarettes. He has a 0.50 pack-year smoking history. He has never used smokeless tobacco. He reports current alcohol use of about 14.0 standard drinks per week. He reports that he does not use drugs.  ROS: All other review of systems were reviewed and are negative except what is noted above in HPI  Physical Exam: BP 124/72    Pulse (!) 56   Constitutional:  Alert and oriented, No acute distress. HEENT: [place] AT, moist mucus membranes.  Trachea midline, no masses. Cardiovascular: No clubbing, cyanosis, or edema. Respiratory: Normal respiratory effort, no increased work of breathing. GI: Abdomen is soft, nontender, nondistended, no abdominal masses GU: No CVA tenderness.  Lymph: No cervical or inguinal lymphadenopathy. Skin: No rashes, bruises or suspicious lesions. Neurologic: Grossly intact, no focal deficits, moving all 4 extremities. Psychiatric: Normal mood and affect.  Laboratory Data: Lab Results  Component Value Date   WBC 6.3 11/26/2020   HGB 15.1 11/26/2020   HCT 45.7 11/26/2020   MCV 94.0 11/26/2020   PLT 185 11/26/2020    Lab Results  Component Value Date   CREATININE 1.07 06/22/2021    Lab Results  Component Value Date   PSA 4.4 (H) 11/25/2019   PSA 5.20 11/20/2019   PSA 5.61 05/08/2019   PSA 5.61 05/08/2019    Lab Results  Component Value Date   TESTOSTERONE 414 11/25/2019    Lab Results  Component Value Date   HGBA1C 5.5 11/26/2020    Urinalysis    Component Value Date/Time   APPEARANCEUR Cloudy (A) 04/02/2021 1236   GLUCOSEU Negative 04/02/2021 1236   BILIRUBINUR Negative 04/02/2021 1236   PROTEINUR Negative 04/02/2021 1236   NITRITE Negative 04/02/2021 1236   LEUKOCYTESUR 3+ (A) 04/02/2021 1236    Lab Results  Component  Value Date   LABMICR See below: 04/02/2021   WBCUA >30 (A) 04/02/2021   LABEPIT None seen 04/02/2021   BACTERIA None seen 04/02/2021    Pertinent Imaging:  No results found for this or any previous visit.  No results found for this or any previous visit.  No results found for this or any previous visit.  No results found for this or any previous  visit.  No results found for this or any previous visit.  No results found for this or any previous visit.  No results found for this or any previous visit.  No results found for this or any previous visit.   Assessment & Plan:    1. Elevated PSA -RTC 6 months with PSA - Urinalysis, Routine w reflex microscopic - BLADDER SCAN AMB NON-IMAGING  2. Weak urinary stream -We will pursue Urolift  3. Benign prostatic hyperplasia with urinary obstruction We discussed the management of his BPH including continued medical therapy, Rezum, Urolift, TURP and simple prostatectomy. After discussing the options the patient has elected to proceed with urolift. Risks/benefits/alternatives discussed.    No follow-ups on file.  Nicolette Bang, MD  [facility] [place]

## 2021-07-24 ENCOUNTER — Other Ambulatory Visit: Payer: Self-pay | Admitting: Cardiology

## 2021-07-26 ENCOUNTER — Other Ambulatory Visit: Payer: 59

## 2021-07-27 ENCOUNTER — Other Ambulatory Visit: Payer: Self-pay | Admitting: Urology

## 2021-07-27 ENCOUNTER — Encounter: Payer: Self-pay | Admitting: Urology

## 2021-07-27 DIAGNOSIS — N401 Enlarged prostate with lower urinary tract symptoms: Secondary | ICD-10-CM

## 2021-07-27 NOTE — Progress Notes (Signed)
Surgical Physician Order Form [facility]   * Scheduling expectation : Next Available  *Length of Case: 30 minutes  *MD Preforming Case: Nicolette Bang, MD  *Assistant Needed: no  *Facility Preference: [name]  *Clearance needed: no  *Anticoagulation Instructions: Hold all anticoagulants  *Aspirin Instructions: Ok to continue Aspirin  -Admit type: OUTpatient  -Anesthesia: General  -Use Standing Orders:  NA  *Diagnosis: BPH w/LUTS  *Procedure:  Cystoscopy with Urolift placement      Additional orders: N/A  -Equipment:  NA -VTE Prophylaxis Standing Order SCDs       Other:   -Standing Lab Orders Per Anesthesia    Lab other: None  -Standing Test orders EKG/Chest x-ray per Anesthesia       Test other:   - Medications:  Ancef 2gm IV  -Other orders:  PAS  *Post-op visit Date/Instructions:   2 days voiding trial

## 2021-07-27 NOTE — Patient Instructions (Signed)

## 2021-08-02 ENCOUNTER — Ambulatory Visit: Payer: 59 | Admitting: Urology

## 2021-08-09 ENCOUNTER — Ambulatory Visit: Payer: 59 | Admitting: Cardiology

## 2021-08-10 ENCOUNTER — Telehealth: Payer: Self-pay

## 2021-08-10 NOTE — Progress Notes (Signed)
[facility] [place] Surgery Posting Form   Surgery Date/Time: Date: 09/09/2021  Surgeon: Dr. Nicolette Bang, MD  Surgery Location: Day Surgery  Inpt ( No  )   Outpt (Yes)   Obs ( No  )   Diagnosis: Benign Prostatic Hyperplasia with Lower Urinary Tract Symptoms N40.1, N13.8  -CPT: 45038,88280  Surgery: Cystoscopy with Insertion of Urolift  Stop Anticoagulations: Yes  Cardiac/Medical/Pulmonary Clearance needed: Yes  Clearance needed from Dr: Dr. Percival Spanish  Clearance request sent on: Date: 08/10/21   *Orders entered into EPIC  Date: 08/10/21   *Case booked in EPIC  Date: 08/10/21  *Notified pt of Surgery: Date: 08/10/21  PRE-OP UA & CX: no  *Placed into Prior Authorization Work [place] Date: 08/10/21   Assistant/laser/rep:No

## 2021-08-10 NOTE — Progress Notes (Signed)
REQUEST FOR SURGICAL CLEARANCE       Date: Date: 08/10/21  Faxed to: Dr. Percival Spanish  Surgeon: Dr. Nicolette Bang, MD     Date of Surgery: 09/09/2021  Operation: Cystoscopy with Insertion of Urolift  Anesthesia Type: General    Diagnosis: BPH with Lower Urinary Tract Symptoms  Patient Requires:   Cardiac / Vascular Clearance : Yes  Reason: Would like for patient to hold Brilinta prior to surgery as well as Aspirin   Risk Assessment:    Low   '[]'$       Moderate   '[]'$     High   '[]'$           This patient is optimized for surgery  YES '[]'$       NO   '[]'$    I recommend further assessment/workup prior to surgery. YES '[]'$      NO  '[]'$   Appointment scheduled for: _______________________   Further recommendations: ____________________________________     Physician Signature:__________________________________   Printed Name: ________________________________________   Date: _________________

## 2021-08-10 NOTE — Telephone Encounter (Signed)
I spoke with Ricky Chavez. We have discussed possible surgery dates and Thursday April 6th, 2023 was agreed upon by all parties. Patient given information about surgery date, what to expect pre-operatively and post operatively. ? ? We discussed that a pre-op nurse will be calling to set up the pre-op visit that will take place prior to surgery. Informed patient that our office will communicate any additional care to be provided after surgery.  ? ?Patients questions or concerns were discussed during our call. Advised to call our office should there be any additional information, questions or concerns that arise. Patient verbalized understanding.  ? ?

## 2021-08-20 ENCOUNTER — Other Ambulatory Visit: Payer: Self-pay | Admitting: Cardiology

## 2021-08-27 ENCOUNTER — Other Ambulatory Visit: Payer: Self-pay | Admitting: Urology

## 2021-09-13 ENCOUNTER — Ambulatory Visit: Payer: 59 | Admitting: Physician Assistant

## 2021-09-14 ENCOUNTER — Telehealth: Payer: Self-pay | Admitting: *Deleted

## 2021-09-14 NOTE — Telephone Encounter (Signed)
Primary Cardiologist:James Hochrein, MD ? ?Chart reviewed as part of pre-operative protocol coverage. Because of Ricky Chavez's past medical history and time since last visit, he/she will require a follow-up visit in order to better assess preoperative cardiovascular risk. ? ?Pre-op covering staff: ?- Please schedule appointment and call patient to inform them. ?- Please contact requesting surgeon's office via preferred method (i.e, phone, fax) to inform them of need for appointment prior to surgery. ? ?If applicable, this message will also be routed to pharmacy pool and/or primary cardiologist for input on holding anticoagulant/antiplatelet agent as requested below so that this information is available at time of patient's appointment.  ? ?Emmaline Life, NP-C ? ?  ?09/14/2021, 2:45 PM ?[facility] ?[address], [address] ?Office [phone] Fax [phone] ? ?

## 2021-09-14 NOTE — Telephone Encounter (Signed)
? ?  Pre-operative Risk Assessment  ?  ?Patient Name: Ricky Chavez  ?DOB: 1955-09-05 ?MRN: [id number]  ? ?  ? ?Request for Surgical Clearance   ? ?Procedure:  cystoscopy with insertion of urolift ? ?Date of Surgery:  Clearance 10/04/21                              ?   ?Surgeon:  dr Nicolette Bang ?Surgeon's Group or Practice Name:  [facility] ?Phone number:  not listed ?Fax number:  [phone] ?  ?Type of Clearance Requested:   ?Brilinta and aspirin prior to surgery-they need direction ?  ?Type of Anesthesia:  not listed ?  ?Additional requests/questions:   ? ?Signed, ?Fredia Beets   ?09/14/2021, 12:24 PM   ?

## 2021-09-14 NOTE — Telephone Encounter (Signed)
Pt has in office appt with Dr. Percival Spanish 09/24/21. I have added need pre op clearance to appt notes. I will send notes to MD for upcoming appt. Will send FYI to requesting office the pt has appt 09/24/21.  ?

## 2021-09-21 ENCOUNTER — Encounter: Payer: Self-pay | Admitting: Cardiology

## 2021-09-23 DIAGNOSIS — Z01810 Encounter for preprocedural cardiovascular examination: Secondary | ICD-10-CM | POA: Insufficient documentation

## 2021-09-23 NOTE — Progress Notes (Signed)
?  ?Cardiology Office Note ? ? ?Date:  09/24/2021  ? ?ID:  Ricky Chavez, DOB June 21, 1955, MRN [id number] ? ?PCP:  [name], MD  ?Cardiologist:   Minus Breeding, MD ? ? ?Chief Complaint  ?Patient presents with  ? Coronary Artery Disease  ? ? ?  ?History of Present Illness: ?Ricky Chavez is a 66 y.o. male who presents for follow up of CAD.  The patient was treated at [facility] for CAD in late 2016.  He had stents to LAD patent; small diagonal stent with 90% ostial stenosis--good overall LV function with mild anterior and focal inferoapical hypokinesis, EF 60-65%.  He had unsuccessful angioplasty of the diagonal.  He had a stress echo in 2016 December that was negative for any evidence of ischemia. The patient had a complicated course with his initial angioplasty.  In 2019 he had chest pain.  He had a low risk perfusion study.  ? ?Since he was last seen he has done very well.  He did hurt his knee and so has not been exercising as much on the treadmill but he was doing this routinely. The patient denies any new symptoms such as chest discomfort, neck or arm discomfort. There has been no new shortness of breath, PND or orthopnea. There have been no reported palpitations, presyncope or syncope.  He is going to have a UroLift procedure.  He Brilinta is going to be held for 5 days for procedure ? ? ?Past Medical History:  ?Diagnosis Date  ? Allergic rhinitis   ? BPH with obstruction/lower urinary tract symptoms   ? flomax and finasteride qod per urol  ? Coronary artery disease   ? With preserved EF.  Needs DAPT lifetime due to high risk status.  ? COVID 09/01/2020  ? Elevated PSA onset @ 2009  ? Neg bx x 3.  MRI c/w bph only.  PSAs stable as of 11/2019. Dr. Demetrios Isaacs  ? GERD (gastroesophageal reflux disease)   ? Hearing loss of both ears   ? History of myocardial infarction 2011  ? Hyperlipidemia   ? Hypertension   ? Insomnia   ? Organic impotence   ? Palpitations 05/2018  ? PVCs  ? Sciatica   ?  Piriformis syndrome likely--improving with gabapentin as of 04/2017.  May need MRI L spine and/or emg/NCS  ? Squamous cell carcinoma   ? ? ?Past Surgical History:  ?Procedure Laterality Date  ? CARDIAC CATHETERIZATION  01/31/13  ? Stents to LAD patent; small diagonal stent with 90% ostial stenosis--good overall LV function with mild anterior and focal inferoapical hypokinesis, EF 60-65%.  Unsuccessful angioplasty of diagonal.  ? CARDIOVASCULAR STRESS TEST  05/26/15  ? stress echo neg for ischemia  ? CARDIOVASCULAR STRESS TEST  10/04/2017  ? Myoc perf imaging: normal (EF 59%)  ? COLONOSCOPY  2008; 06/14/16  ? 2008 Normal.  2018 hyperplastic polyp +internal hemorrhoids.  Recall 2028.  ? HYDROCELE EXCISION / REPAIR  early 2000's  ? INGUINAL HERNIA REPAIR Bilateral 1998  ? PROSTATE BIOPSY    ? Multiple; all neg.  Prostate MRI 2018 -NO SIGN OF PROSTATE CA.  ? PTCA    ? Cath 2011---4 stents--LAD and diagonal.  2014 repeat cath for CP showed no changes.  ? Scrotal u/s  01/2015  ? numerous extratesticular cystic lesions bilat, c/w numerous epididymal cysts vs loculated hydrocele--following expectantly.  ? ? ? ?Current Outpatient Medications  ?Medication Sig Dispense Refill  ? aspirin EC 81 MG tablet Take 1 tablet  by mouth daily.    ? atorvastatin (LIPITOR) 40 MG tablet Take 1 tablet (40 mg total) by mouth daily. 90 tablet 1  ? AVODART 0.5 MG capsule TAKE 1 CAPSULE EVERY OTHER DAY 30 capsule 3  ? carvedilol (COREG) 6.25 MG tablet Take 1 tablet (6.25 mg total) by mouth 2 (two) times daily. 180 tablet 1  ? melatonin 5 MG TABS Take 1 tablet by mouth at bedtime.    ? Multiple Vitamins-Minerals (MULTIVITAMIN ADULT PO) Take 1 capsule by mouth daily.    ? nitroGLYCERIN (NITROLINGUAL) 0.4 MG/SPRAY spray Place 1 spray under the tongue every 5 (five) minutes x 3 doses as needed for chest pain. 12 g 1  ? omeprazole (PRILOSEC) 20 MG capsule Take 1 capsule by mouth daily.    ? silodosin (RAPAFLO) 8 MG CAPS capsule Take 1 capsule (8 mg total)  by mouth daily. 90 capsule 3  ? lisinopril (ZESTRIL) 5 MG tablet Take 1 tablet (5 mg total) by mouth daily. 90 tablet 3  ? ticagrelor (BRILINTA) 60 MG TABS tablet Take 1 tablet (60 mg total) by mouth 2 (two) times daily. 180 tablet 3  ? ?No current facility-administered medications for this visit.  ? ? ?Allergies:   Cephalexin  ? ? ?ROS:  Please see the history of present illness.   Otherwise, review of systems are positive for none.   All other systems are reviewed and negative.  ? ? ?PHYSICAL EXAM: ?VS:  BP 110/70   Pulse (!) 54   Ht '5\' 8"'$  (1.727 m)   Wt 190 lb 3.2 oz (86.3 kg)   SpO2 98%   BMI 28.92 kg/m?  , BMI Body mass index is 28.92 kg/m?. ?GENERAL:  Well appearing ?NECK:  No jugular venous distention, waveform within normal limits, carotid upstroke brisk and symmetric, no bruits, no thyromegaly ?LUNGS:  Clear to auscultation bilaterally ?CHEST:  Unremarkable ?HEART:  PMI not displaced or sustained,S1 and S2 within normal limits, no S3, no S4, no clicks, no rubs, no murmurs ?ABD:  Flat, positive bowel sounds normal in frequency in pitch, no bruits, no rebound, no guarding, no midline pulsatile mass, no hepatomegaly, no splenomegaly ?EXT:  2 plus pulses throughout, no edema, no cyanosis no clubbing ? ?EKG:  EKG is   ordered today. ?The ekg ordered today demonstrates sinus rhythm, rate 54, axis within normal limits, intervals within normal limits, nonspecific T wave flattening. ? ? ?Recent Labs: ?11/26/2020: Hemoglobin 15.1; Platelets 185; TSH 1.85 ?06/22/2021: ALT 32; BUN 19; Creat 1.07; Potassium 4.7; Sodium 139  ? ? ?Lipid Panel ?   ?Component Value Date/Time  ? CHOL 137 11/26/2020 0854  ? TRIG 111 11/26/2020 0854  ? HDL 49 11/26/2020 0854  ? CHOLHDL 2.8 11/26/2020 0854  ? VLDL 24 09/01/2016 0925  ? [place] 69 11/26/2020 0854  ? ?  ? ?Wt Readings from Last 3 Encounters:  ?09/24/21 190 lb 3.2 oz (86.3 kg)  ?06/22/21 192 lb 12.8 oz (87.5 kg)  ?04/02/21 191 lb (86.6 kg)  ?  ? ? ?Other studies  Reviewed: ?Additional studies/ records that were reviewed today include: Labs. ?Review of the above records demonstrates:  Please see elsewhere in the note.   ? ? ?ASSESSMENT AND PLAN: ? ?CAD:   The patient has no new sypmtoms.  No further cardiovascular testing is indicated.  We will continue with aggressive risk reduction and meds as listed.he is to be on lifelong DAPT per his procedure note from years ago.  However, he cannot interrupt  his Brilinta for 5 days prior to his UroLift.  I would not want him to stop the aspirin however.  He should start this back when it is suggested to be safe per the urologist.   ? ?DYSLIPIDEMIA: LDL was 69 with an HDL 49.  No change in therapy.  ? ?HTN:    Blood pressure is at target.  No change in therapy.  ? ?PREOP: The patient will be at acceptable risk for the planned surgery.  He has a high functional level.  He has no symptoms.  No high risk features or findings.  This is not a high risk procedure.  According to ACC/AHA guidelines no cardiovascular testing preop is suggested.  He can hold Brilinta as above. ? ? ?Current medicines are reviewed at length with the patient today.  The patient does not have concerns regarding medicines. ? ?The following changes have been made:  None ? ?Labs/ tests ordered today include:   ? ?Orders Placed This Encounter  ?Procedures  ? EKG 12-Lead  ? ? ? ?Disposition:   FU with me in 12 months ? ? ?Signed, ?Minus Breeding, MD  ?09/24/2021 9:08 AM    ?[place] ? ? ?

## 2021-09-24 ENCOUNTER — Encounter: Payer: Self-pay | Admitting: Cardiology

## 2021-09-24 ENCOUNTER — Ambulatory Visit: Payer: 59 | Admitting: Cardiology

## 2021-09-24 VITALS — BP 110/70 | HR 54 | Ht 68.0 in | Wt 190.2 lb

## 2021-09-24 DIAGNOSIS — I10 Essential (primary) hypertension: Secondary | ICD-10-CM

## 2021-09-24 DIAGNOSIS — I25810 Atherosclerosis of coronary artery bypass graft(s) without angina pectoris: Secondary | ICD-10-CM

## 2021-09-24 DIAGNOSIS — E785 Hyperlipidemia, unspecified: Secondary | ICD-10-CM | POA: Diagnosis not present

## 2021-09-24 DIAGNOSIS — Z01810 Encounter for preprocedural cardiovascular examination: Secondary | ICD-10-CM | POA: Diagnosis not present

## 2021-09-24 MED ORDER — TICAGRELOR 60 MG PO TABS: 60.0000 mg | ORAL_TABLET | Freq: Two times a day (BID) | ORAL | 3 refills | Status: DC

## 2021-09-24 MED ORDER — LISINOPRIL 5 MG PO TABS: 5.0000 mg | ORAL_TABLET | Freq: Every day | ORAL | 3 refills | Status: DC

## 2021-09-24 NOTE — Patient Instructions (Signed)
Medication Instructions:  No changes *If you need a refill on your cardiac medications before your next appointment, please call your pharmacy*   Lab Work: None ordered If you have labs (blood work) drawn today and your tests are completely normal, you will receive your results only by: MyChart Message (if you have MyChart) OR A paper copy in the mail If you have any lab test that is abnormal or we need to change your treatment, we will call you to review the results.   Testing/Procedures: None ordered   Follow-Up: At [facility], you and your health needs are our priority.  As part of our continuing mission to provide you with exceptional heart care, we have created designated Provider Care Teams.  These Care Teams include your primary Cardiologist (physician) and Advanced Practice Providers (APPs -  Physician Assistants and Nurse Practitioners) who all work together to provide you with the care you need, when you need it.  We recommend signing up for the patient portal called "MyChart".  Sign up information is provided on this After Visit Summary.  MyChart is used to connect with patients for Virtual Visits (Telemedicine).  Patients are able to view lab/test results, encounter notes, upcoming appointments, etc.  Non-urgent messages can be sent to your provider as well.   To learn more about what you can do with MyChart, go to [email or web address].    Your next appointment:   12 month(s)  The format for your next appointment:   In Person  Provider:   James Hochrein, MD {   Important Information About Sugar       

## 2021-09-28 NOTE — Patient Instructions (Signed)
? ? ? ? ? ? Ricky Chavez ? 09/28/2021  ?  ? '@PREFPERIOPPHARMACY'$ @ ? ? Your procedure is scheduled on  10/04/2021. ? ? Report to [name] at  0600  A.M. ? ? Call this number if you have problems the morning of surgery: ? [phone] ? ? Remember: ? Do not eat or drink after midnight. ?  ?  ? Take these medicines the morning of surgery with A SIP OF WATER  ? ?                        carvedilol, prilosec, rapaflo. ?  ? ? Do not wear jewelry, make-up or nail polish. ? Do not wear lotions, powders, or perfumes, or deodorant. ? Do not shave 48 hours prior to surgery.  Men may shave face and neck. ? Do not bring valuables to the hospital. ? [facility] is not responsible for any belongings or valuables. ? ?Contacts, dentures or bridgework may not be worn into surgery.  Leave your suitcase in the car.  After surgery it may be brought to your room. ? ?For patients admitted to the hospital, discharge time will be determined by your treatment team. ? ?Patients discharged the day of surgery will not be allowed to drive home and must have someone with them for 24 hours.  ? ? ?Special instructions:   DO NOT smoke tobacco or vape for 24 hours before your procedure. ? ?Please read over the following fact sheets that you were given. ?Coughing and Deep Breathing, Surgical Site Infection Prevention, Anesthesia Post-op Instructions, and Care and Recovery After Surgery ?  ? ? ? Prostatic Urethral Lift, Care After ?The following information offers guidance on how to care for yourself after your procedure. Your health care provider may also give you more specific instructions. If you have problems or questions, contact your health care provider. ?What can I expect after the procedure? ?After the procedure, it is common to have: ?Soreness or discomfort in your penis from having the cystoscope inserted during the procedure. ?Discomfort or burning when urinating. ?An increased urge to urinate. ?More frequent urination. ?Urine that is  blood-tinged. ?These symptoms should go away after a few days. ?Follow these instructions at home: ?Activity ? ?If you were given a sedative during the procedure, it can affect you for several hours. Do not drive or operate machinery until your health care provider says that it is safe. ?Avoid sitting for a long time without moving. Get up to take short walks every 1-2 hours. This is important to improve blood flow and breathing. Ask for help if you feel weak or unsteady. ?You may have to avoid lifting. Ask your health care provider how much you can safely lift. ?Avoid intense physical activity for as long as told by your health care provider. ?Return to your normal activities as told by your health care provider. Ask your health care provider what activities are safe for you. Ask when you can return to sexual activity. ?General instructions ? ?Take over-the-counter and prescription medicines only as told by your health care provider. ?Ask your health care provider if the medicine prescribed to you: ?Requires you to avoid driving or using machinery. ?Can cause constipation. You may need to take these actions to prevent or treat constipation: ?Drink enough fluid to keep your urine pale yellow. ?Take over-the-counter or prescription medicines. ?Eat foods that are high in fiber, such as beans, whole grains, and fresh fruits and vegetables. ?Limit  foods that are high in fat and processed sugars, such as fried or sweet foods. ?Do not use any products that contain nicotine or tobacco. These products include cigarettes, chewing tobacco, and vaping devices, such as e-cigarettes. These can delay healing after the procedure. If you need help quitting, ask your health care provider. ?Keep all follow-up visits. This is important. ?Contact a health care provider if: ?You have chills or a fever. ?You have pain when passing urine. ?You have bright red blood or blood clots in your urine. ?You have difficulty passing urine. ?You  have leaking of urine (incontinence). ?Get help right away if: ?You have chest pain or shortness of breath. ?You have leg pain or swelling. ?You cannot pass urine. ?These symptoms may be an emergency. Get help right away. Call 911. ?Do not wait to see if the symptoms will go away. ?Do not drive yourself to the hospital. ?Summary ?After the procedure, it is common to have discomfort or burning when urinating, an increased urge to urinate, more frequent urination, and urine that is blood-tinged. ?You may have to avoid lifting. Ask your health care provider how much you can safely lift. ?Return to your normal activities as told by your health care provider. Ask when you can return to sexual activity. ?This information is not intended to replace advice given to you by your health care provider. Make sure you discuss any questions you have with your health care provider. ?Document Revised: 12/18/2020 Document Reviewed: 12/18/2020 ?Elsevier Patient Education ? [place]. ?General Anesthesia, Adult, Care After ?This sheet gives you information about how to care for yourself after your procedure. Your health care provider may also give you more specific instructions. If you have problems or questions, contact your health care provider. ?What can I expect after the procedure? ?After the procedure, the following side effects are common: ?Pain or discomfort at the IV site. ?Nausea. ?Vomiting. ?Sore throat. ?Trouble concentrating. ?Feeling cold or chills. ?Feeling weak or tired. ?Sleepiness and fatigue. ?Soreness and body aches. These side effects can affect parts of the body that were not involved in surgery. ?Follow these instructions at home: ?For the time period you were told by your health care provider: ? ?Rest. ?Do not participate in activities where you could fall or become injured. ?Do not drive or use machinery. ?Do not drink alcohol. ?Do not take sleeping pills or medicines that cause drowsiness. ?Do not make  important decisions or sign legal documents. ?Do not take care of children on your own. ?Eating and drinking ?Follow any instructions from your health care provider about eating or drinking restrictions. ?When you feel hungry, start by eating small amounts of foods that are soft and easy to digest (bland), such as toast. Gradually return to your regular diet. ?Drink enough fluid to keep your urine pale yellow. ?If you vomit, rehydrate by drinking water, juice, or clear broth. ?General instructions ?If you have sleep apnea, surgery and certain medicines can increase your risk for breathing problems. Follow instructions from your health care provider about wearing your sleep device: ?Anytime you are sleeping, including during daytime naps. ?While taking prescription pain medicines, sleeping medicines, or medicines that make you drowsy. ?Have a responsible adult stay with you for the time you are told. It is important to have someone help care for you until you are awake and alert. ?Return to your normal activities as told by your health care provider. Ask your health care provider what activities are safe for you. ?Take over-the-counter  and prescription medicines only as told by your health care provider. ?If you smoke, do not smoke without supervision. ?Keep all follow-up visits as told by your health care provider. This is important. ?Contact a health care provider if: ?You have nausea or vomiting that does not get better with medicine. ?You cannot eat or drink without vomiting. ?You have pain that does not get better with medicine. ?You are unable to pass urine. ?You develop a skin rash. ?You have a fever. ?You have redness around your IV site that gets worse. ?Get help right away if: ?You have difficulty breathing. ?You have chest pain. ?You have blood in your urine or stool, or you vomit blood. ?Summary ?After the procedure, it is common to have a sore throat or nausea. It is also common to feel tired. ?Have a  responsible adult stay with you for the time you are told. It is important to have someone help care for you until you are awake and alert. ?When you feel hungry, start by eating small amounts of foods that

## 2021-09-29 ENCOUNTER — Encounter (HOSPITAL_COMMUNITY): Payer: Self-pay

## 2021-09-29 ENCOUNTER — Encounter (HOSPITAL_COMMUNITY)
Admission: RE | Admit: 2021-09-29 | Discharge: 2021-09-29 | Disposition: A | Discharge status: Home / Self Care | Payer: 59 | Source: Ambulatory Visit | Attending: Urology | Admitting: Urology

## 2021-09-29 ENCOUNTER — Other Ambulatory Visit: Payer: Self-pay

## 2021-09-29 HISTORY — DX: Acute myocardial infarction, unspecified: I21.9

## 2021-09-29 NOTE — Progress Notes (Signed)
?   09/29/21 0847  ?OBSTRUCTIVE SLEEP APNEA  ?Have you ever been diagnosed with sleep apnea through a sleep study? No  ?Do you snore loudly (loud enough to be heard through closed doors)?  0  ?Do you often feel tired, fatigued, or sleepy during the daytime (such as falling asleep during driving or talking to someone)? 1  ?Has anyone observed you stop breathing during your sleep? 0  ?Do you have, or are you being treated for high blood pressure? 1  ?BMI more than 35 kg/m2? 0  ?Age > 18 (1-yes) 1  ?Neck circumference greater than:Male 16 inches or larger, Male 17inches or larger? 0  ?Male Gender (Yes=1) 1  ?Obstructive Sleep Apnea Score 4  ? ? ?

## 2021-10-04 ENCOUNTER — Ambulatory Visit (HOSPITAL_COMMUNITY): Payer: 59 | Admitting: Anesthesiology

## 2021-10-04 ENCOUNTER — Ambulatory Visit (HOSPITAL_BASED_OUTPATIENT_CLINIC_OR_DEPARTMENT_OTHER): Payer: 59 | Admitting: Anesthesiology

## 2021-10-04 ENCOUNTER — Ambulatory Visit (HOSPITAL_COMMUNITY)
Admission: RE | Admit: 2021-10-04 | Discharge: 2021-10-04 | Disposition: A | Discharge status: Home / Self Care | Payer: 59 | Attending: Urology | Admitting: Urology

## 2021-10-04 ENCOUNTER — Encounter (HOSPITAL_COMMUNITY): Payer: Self-pay | Admitting: Urology

## 2021-10-04 ENCOUNTER — Encounter (HOSPITAL_COMMUNITY)
Admission: RE | Disposition: A | Discharge status: Home / Self Care | Payer: Self-pay | Source: Home / Self Care | Attending: Urology

## 2021-10-04 DIAGNOSIS — R3912 Poor urinary stream: Secondary | ICD-10-CM | POA: Diagnosis not present

## 2021-10-04 DIAGNOSIS — K219 Gastro-esophageal reflux disease without esophagitis: Secondary | ICD-10-CM | POA: Insufficient documentation

## 2021-10-04 DIAGNOSIS — Z87891 Personal history of nicotine dependence: Secondary | ICD-10-CM

## 2021-10-04 DIAGNOSIS — Z955 Presence of coronary angioplasty implant and graft: Secondary | ICD-10-CM | POA: Diagnosis not present

## 2021-10-04 DIAGNOSIS — I252 Old myocardial infarction: Secondary | ICD-10-CM | POA: Diagnosis not present

## 2021-10-04 DIAGNOSIS — N401 Enlarged prostate with lower urinary tract symptoms: Secondary | ICD-10-CM | POA: Diagnosis present

## 2021-10-04 DIAGNOSIS — I10 Essential (primary) hypertension: Secondary | ICD-10-CM | POA: Insufficient documentation

## 2021-10-04 DIAGNOSIS — N138 Other obstructive and reflux uropathy: Secondary | ICD-10-CM | POA: Insufficient documentation

## 2021-10-04 DIAGNOSIS — I2510 Atherosclerotic heart disease of native coronary artery without angina pectoris: Secondary | ICD-10-CM

## 2021-10-04 DIAGNOSIS — N400 Enlarged prostate without lower urinary tract symptoms: Secondary | ICD-10-CM | POA: Diagnosis not present

## 2021-10-04 HISTORY — PX: CYSTOSCOPY WITH INSERTION OF UROLIFT: SHX6678

## 2021-10-04 SURGERY — CYSTOSCOPY WITH INSERTION OF UROLIFT
Anesthesia: General | Site: Bladder

## 2021-10-04 MED ORDER — ONDANSETRON HCL 4 MG/2ML IJ SOLN: 4.0000 mg | Freq: Once | INTRAMUSCULAR | Status: DC | PRN

## 2021-10-04 MED ORDER — ONDANSETRON HCL 4 MG/2ML IJ SOLN
INTRAMUSCULAR | Status: DC | PRN
  Administered 2021-10-04: 4 mg via INTRAVENOUS

## 2021-10-04 MED ORDER — WATER FOR IRRIGATION, STERILE IR SOLN
Status: DC | PRN
  Administered 2021-10-04: 500 mL

## 2021-10-04 MED ORDER — EPHEDRINE 5 MG/ML INJ
INTRAVENOUS | Status: AC
  Filled 2021-10-04: qty 5

## 2021-10-04 MED ORDER — CHLORHEXIDINE GLUCONATE 0.12 % MT SOLN
15.0000 mL | Freq: Once | OROMUCOSAL | Status: AC
  Administered 2021-10-04: 15 mL via OROMUCOSAL

## 2021-10-04 MED ORDER — FENTANYL CITRATE (PF) 100 MCG/2ML IJ SOLN
INTRAMUSCULAR | Status: AC
Start: 2021-10-04 — End: ?
  Filled 2021-10-04: qty 2

## 2021-10-04 MED ORDER — PROPOFOL 10 MG/ML IV BOLUS
INTRAVENOUS | Status: AC
  Filled 2021-10-04: qty 20

## 2021-10-04 MED ORDER — LIDOCAINE HCL (PF) 2 % IJ SOLN
INTRAMUSCULAR | Status: AC
  Filled 2021-10-04: qty 10

## 2021-10-04 MED ORDER — LACTATED RINGERS IV SOLN
INTRAVENOUS | Status: DC
  Administered 2021-10-04: 1000 mL via INTRAVENOUS

## 2021-10-04 MED ORDER — ORAL CARE MOUTH RINSE: 15.0000 mL | Freq: Once | OROMUCOSAL | Status: AC

## 2021-10-04 MED ORDER — LIDOCAINE HCL (CARDIAC) PF 50 MG/5ML IV SOSY
PREFILLED_SYRINGE | INTRAVENOUS | Status: DC | PRN
  Administered 2021-10-04: 80 mg via INTRAVENOUS

## 2021-10-04 MED ORDER — FENTANYL CITRATE (PF) 100 MCG/2ML IJ SOLN
INTRAMUSCULAR | Status: DC | PRN
  Administered 2021-10-04: 50 ug via INTRAVENOUS
  Administered 2021-10-04: 25 ug via INTRAVENOUS

## 2021-10-04 MED ORDER — FENTANYL CITRATE PF 50 MCG/ML IJ SOSY: 25.0000 ug | PREFILLED_SYRINGE | INTRAMUSCULAR | Status: DC | PRN

## 2021-10-04 MED ORDER — LIDOCAINE HCL URETHRAL/MUCOSAL 2 % EX GEL
CUTANEOUS | Status: AC
  Filled 2021-10-04: qty 10

## 2021-10-04 MED ORDER — EPHEDRINE SULFATE (PRESSORS) 50 MG/ML IJ SOLN
INTRAMUSCULAR | Status: DC | PRN
  Administered 2021-10-04: 10 mg via INTRAVENOUS

## 2021-10-04 MED ORDER — PROPOFOL 10 MG/ML IV BOLUS
INTRAVENOUS | Status: DC | PRN
  Administered 2021-10-04: 200 mg via INTRAVENOUS

## 2021-10-04 MED ORDER — TRAMADOL HCL 50 MG PO TABS: 50.0000 mg | ORAL_TABLET | Freq: Four times a day (QID) | ORAL | 0 refills | Status: DC | PRN

## 2021-10-04 MED ORDER — LIDOCAINE HCL (PF) 2 % IJ SOLN
INTRAMUSCULAR | Status: AC
  Filled 2021-10-04: qty 5

## 2021-10-04 MED ORDER — CEFAZOLIN SODIUM-DEXTROSE 2-4 GM/100ML-% IV SOLN
2.0000 g | INTRAVENOUS | Status: AC
  Administered 2021-10-04: 2 g via INTRAVENOUS
  Filled 2021-10-04: qty 100

## 2021-10-04 SURGICAL SUPPLY — 23 items
BAG DRAIN URO TABLE W/ADPT NS (BAG) ×2 IMPLANT
BAG DRN 8 ADPR NS SKTRN CSTL (BAG) ×1
BAG HAMPER (MISCELLANEOUS) ×2 IMPLANT
CLOTH BEACON ORANGE TIMEOUT ST (SAFETY) ×2 IMPLANT
GLOVE BIO SURGEON STRL SZ8 (GLOVE) ×2 IMPLANT
GLOVE BIOGEL PI IND STRL 6.5 (GLOVE) IMPLANT
GLOVE BIOGEL PI IND STRL 7.0 (GLOVE) ×2 IMPLANT
GLOVE BIOGEL PI INDICATOR 6.5 (GLOVE) ×1
GLOVE BIOGEL PI INDICATOR 7.0 (GLOVE) ×2
GLOVE SS BIOGEL STRL SZ 6.5 (GLOVE) IMPLANT
GLOVE SUPERSENSE BIOGEL SZ 6.5 (GLOVE) ×1
GOWN STRL REUS W/TWL LRG LVL3 (GOWN DISPOSABLE) ×2 IMPLANT
GOWN STRL REUS W/TWL XL LVL3 (GOWN DISPOSABLE) ×2 IMPLANT
KIT TURNOVER CYSTO (KITS) ×2 IMPLANT
MANIFOLD NEPTUNE II (INSTRUMENTS) ×2 IMPLANT
PACK CYSTO (CUSTOM PROCEDURE TRAY) ×2 IMPLANT
PAD ARMBOARD 7.5X6 YLW CONV (MISCELLANEOUS) ×2 IMPLANT
SYSTEM UROLIFT (Male Continence) ×6 IMPLANT
TOWEL OR 17X26 4PK STRL BLUE (TOWEL DISPOSABLE) ×2 IMPLANT
TRAY FOLEY W/BAG SLVR 16FR (SET/KITS/TRAYS/PACK) ×2
TRAY FOLEY W/BAG SLVR 16FR ST (SET/KITS/TRAYS/PACK) ×1 IMPLANT
WATER STERILE IRR 3000ML UROMA (IV SOLUTION) ×2 IMPLANT
WATER STERILE IRR 500ML POUR (IV SOLUTION) ×2 IMPLANT

## 2021-10-04 NOTE — H&P (Signed)
Urology Admission H&P ? ?Chief Complaint: weak urinary stream ? ?History of Present Illness: Mr Sawaya is a 66yo here for followup for Urolift for BPH and elevated PSA. PSA decreased from 8.7 to 6.3.  He is on avodart and rapaflo '8mg'$ . IPSS 15 QOl 3. Urine stream fair. He has nocturia 0x.  ? ?Past Medical History:  ?Diagnosis Date  ? Allergic rhinitis   ? BPH with obstruction/lower urinary tract symptoms   ? flomax and finasteride qod per urol  ? Coronary artery disease   ? With preserved EF.  Needs DAPT lifetime due to high risk status.  ? COVID 09/01/2020  ? Elevated PSA onset @ 2009  ? Neg bx x 3.  MRI c/w bph only.  PSAs stable as of 11/2019. Dr. Demetrios Isaacs  ? GERD (gastroesophageal reflux disease)   ? Hearing loss of both ears   ? History of myocardial infarction 2011  ? Hyperlipidemia   ? Hypertension   ? Insomnia   ? Myocardial infarction [facility])   ? Organic impotence   ? Palpitations 05/2018  ? PVCs  ? Sciatica   ? Piriformis syndrome likely--improving with gabapentin as of 04/2017.  May need MRI L spine and/or emg/NCS  ? Squamous cell carcinoma   ? ?Past Surgical History:  ?Procedure Laterality Date  ? CARDIAC CATHETERIZATION  01/31/13  ? Stents to LAD patent; small diagonal stent with 90% ostial stenosis--good overall LV function with mild anterior and focal inferoapical hypokinesis, EF 60-65%.  Unsuccessful angioplasty of diagonal.  ? CARDIOVASCULAR STRESS TEST  05/26/15  ? stress echo neg for ischemia  ? CARDIOVASCULAR STRESS TEST  10/04/2017  ? Myoc perf imaging: normal (EF 59%)  ? COLONOSCOPY  2008; 06/14/16  ? 2008 Normal.  2018 hyperplastic polyp +internal hemorrhoids.  Recall 2028.  ? HYDROCELE EXCISION / REPAIR  early 2000's  ? INGUINAL HERNIA REPAIR Bilateral 1998  ? PROSTATE BIOPSY    ? Multiple; all neg.  Prostate MRI 2018 -NO SIGN OF PROSTATE CA.  ? PTCA    ? Cath 2011---4 stents--LAD and diagonal.  2014 repeat cath for CP showed no changes.  ? Scrotal u/s  01/2015  ? numerous extratesticular  cystic lesions bilat, c/w numerous epididymal cysts vs loculated hydrocele--following expectantly.  ? ? ?Home Medications:  ?Current Facility-Administered Medications  ?Medication Dose Route Frequency Provider Last Rate Last Admin  ? ceFAZolin (ANCEF) IVPB 2g/100 mL premix  2 g Intravenous 30 min Pre-Op Tamica Covell, Candee Furbish, MD      ? lactated ringers infusion   Intravenous Continuous Louann Sjogren, MD 10 mL/hr at 10/04/21 0723 1,000 mL at 10/04/21 0723  ? water for irrigation, sterile for irrigation SOLN    PRN Cleon Gustin, MD   500 mL at 10/04/21 0724  ? ?Allergies:  ?Allergies  ?Allergen Reactions  ? Cephalexin Other (See Comments)  ?  Cause the patient to develop Cdiff   ? ? ?Family History  ?Problem Relation Age of Onset  ? Hyperlipidemia Mother   ? Hypertension Mother   ? Heart disease Mother 66  ?     CABG  ? Other Mother   ?     Neuroendocrine carcinoma pancreas (solid mass) and liver (diffuse)  ? Heart disease Father 64  ?     CABG  ? Hyperlipidemia Father   ? Hypertension Father   ? Colon polyps Father   ? Hypothyroidism Sister   ? Heart attack Brother 60  ?     MI  ?  Prostate cancer Paternal Uncle   ? Peripheral vascular disease Brother   ?     Carotid   ? Stroke Neg Hx   ? Diabetes Neg Hx   ? ?Social History:  reports that he quit smoking about 43 years ago. His smoking use included cigarettes. He has a 0.50 pack-year smoking history. He has never used smokeless tobacco. He reports current alcohol use of about 14.0 standard drinks per week. He reports that he does not use drugs. ? ?Review of Systems  ?Genitourinary:  Positive for difficulty urinating.  ?All other systems reviewed and are negative. ? ?Physical Exam:  ?Vital signs in last 24 hours: ?Temp:  [98 ?F (36.7 ?C)] 98 ?F (36.7 ?C) (05/01 4970) ?Resp:  [24] 24 (05/01 2637) ?BP: (135)/(85) 135/85 (05/01 0655) ?SpO2:  [98 %] 98 % (05/01 0655) ?Physical Exam ?Constitutional:   ?   Appearance: Normal appearance.  ?HENT:  ?   Head:  Normocephalic and atraumatic.  ?   Nose: Nose normal. No congestion.  ?   Mouth/Throat:  ?   Mouth: Mucous membranes are dry.  ?Eyes:  ?   Extraocular Movements: Extraocular movements intact.  ?   Pupils: Pupils are equal, round, and reactive to light.  ?Cardiovascular:  ?   Rate and Rhythm: Normal rate and regular rhythm.  ?Pulmonary:  ?   Effort: Pulmonary effort is normal. No respiratory distress.  ?Abdominal:  ?   General: Abdomen is flat. There is no distension.  ?Musculoskeletal:     ?   General: No swelling. Normal range of motion.  ?   Cervical back: Normal range of motion and neck supple.  ?Skin: ?   General: Skin is warm and dry.  ?Neurological:  ?   General: No focal deficit present.  ?   Mental Status: He is alert and oriented to person, place, and time.  ?Psychiatric:     ?   Mood and Affect: Mood normal.     ?   Behavior: Behavior normal.     ?   Thought Content: Thought content normal.     ?   Judgment: Judgment normal.  ? ? ?Laboratory Data:  ?No results found for this or any previous visit (from the past 24 hour(s)). ?No results found for this or any previous visit (from the past 240 hour(s)). ?Creatinine: ?No results for input(s): CREATININE in the last 168 hours. ?Baseline Creatinine: unknwon ? ?Impression/Assessment:  ?65yo with BPH and weak urinary stream ? ?Plan:  ?We discussed the management of his BPH including continued medical therapy, Rezum, Urolift, TURP and simple prostatectomy. After discussing the options the patient has elected to proceed with Urolift. Risks/benefits/alternatives discussed. ? ? ?Nicolette Bang ?10/04/2021, 7:32 AM  ? ? ? ? ? ?

## 2021-10-04 NOTE — Anesthesia Procedure Notes (Signed)
Procedure Name: LMA Insertion ?Date/Time: 10/04/2021 7:48 AM ?Performed by: Vista Deck, CRNA ?Pre-anesthesia Checklist: Patient identified, Patient being monitored, Emergency Drugs available, Timeout performed and Suction available ?Patient Re-evaluated:Patient Re-evaluated prior to induction ?Oxygen Delivery Method: Circle System Utilized ?Preoxygenation: Pre-oxygenation with 100% oxygen ?Induction Type: IV induction ?Ventilation: Mask ventilation without difficulty ?LMA: LMA inserted ?LMA Size: 4.0 ?Number of attempts: 1 ?Placement Confirmation: positive ETCO2 and breath sounds checked- equal and bilateral ?Tube secured with: Tape ?Dental Injury: Teeth and Oropharynx as per pre-operative assessment  ? ? ? ? ?

## 2021-10-04 NOTE — Transfer of Care (Signed)
Immediate Anesthesia Transfer of Care Note ? ?Patient: Ricky Chavez ? ?Procedure(s) Performed: CYSTOSCOPY WITH INSERTION OF UROLIFT (Bladder) ? ?Patient Location: PACU ? ?Anesthesia Type:General ? ?Level of Consciousness: awake and patient cooperative ? ?Airway & Oxygen Therapy: Patient Spontanous Breathing and Patient connected to nasal cannula oxygen ? ?Post-op Assessment: Report given to RN and Post -op Vital signs reviewed and stable ? ?Post vital signs: Reviewed and stable ? ?Last Vitals:  ?Vitals Value Taken Time  ?BP 111/73 0815  ?Temp 97 0815  ?Pulse 61 10/04/21 0815  ?Resp 16 10/04/21 0815  ?SpO2 96 % 10/04/21 0815  ?Vitals shown include unvalidated device data. ? ?Last Pain:  ?Vitals:  ? 10/04/21 0655  ?TempSrc: Oral  ?PainSc: 0-No pain  ?   ? ?Patients Stated Pain Goal: 8 (10/04/21 0100) ? ?Complications: No notable events documented. ?

## 2021-10-04 NOTE — Anesthesia Preprocedure Evaluation (Signed)
Anesthesia Evaluation  ?Patient identified by MRN, date of birth, ID band ?Patient awake ? ? ? ?Reviewed: ?Allergy & Precautions, H&P , NPO status , Patient's Chart, lab work & pertinent test results, reviewed documented beta blocker date and time  ? ?Airway ?Mallampati: II ? ?TM Distance: >3 FB ?Neck ROM: full ? ? ? Dental ?no notable dental hx. ? ?  ?Pulmonary ?neg pulmonary ROS, former smoker,  ?  ?Pulmonary exam normal ?breath sounds clear to auscultation ? ? ? ? ? ? Cardiovascular ?Exercise Tolerance: Good ?hypertension, + CAD, + Past MI and + Cardiac Stents  ? ?Rhythm:regular Rate:Normal ? ? ?  ?Neuro/Psych ? Neuromuscular disease negative psych ROS  ? GI/Hepatic ?Neg liver ROS, GERD  Medicated,  ?Endo/Other  ?negative endocrine ROS ? Renal/GU ?negative Renal ROS  ?negative genitourinary ?  ?Musculoskeletal ? ? Abdominal ?  ?Peds ? Hematology ?negative hematology ROS ?(+)   ?Anesthesia Other Findings ? ? Reproductive/Obstetrics ?negative OB ROS ? ?  ? ? ? ? ? ? ? ? ? ? ? ? ? ?  ?  ? ? ? ? ? ? ? ? ?Anesthesia Physical ?Anesthesia Plan ? ?ASA: 3 ? ?Anesthesia Plan: General and General LMA  ? ?Post-op Pain Management:   ? ?Induction:  ? ?PONV Risk Score and Plan: Ondansetron ? ?Airway Management Planned:  ? ?Additional Equipment:  ? ?Intra-op Plan:  ? ?Post-operative Plan:  ? ?Informed Consent: I have reviewed the patients History and Physical, chart, labs and discussed the procedure including the risks, benefits and alternatives for the proposed anesthesia with the patient or authorized representative who has indicated his/her understanding and acceptance.  ? ? ? ?Dental Advisory Given ? ?Plan Discussed with: CRNA ? ?Anesthesia Plan Comments:   ? ? ? ? ? ? ?Anesthesia Quick Evaluation ? ?

## 2021-10-04 NOTE — Op Note (Signed)
? ?  PREOPERATIVE DIAGNOSIS:  Benign prostatic hypertrophy with bladder ?outlet obstruction. ? ?POSTOPERATIVE DIAGNOSIS:  Benign prostatic hypertrophy with bladder ?outlet obstruction. ? ?PROCEDURE:  Cystoscopy with implantation of UroLift devices, 6 implants. ? ?SURGEON:  Nicolette Bang, M.D. ? ?ANESTHESIA:  General ? ?ANTIBIOTICS: Ancef ? ?SPECIMEN:  None. ? ?DRAINS:  A 16-French Foley catheter. ? ?BLOOD LOSS:  Minimal. ? ?COMPLICATIONS:  None. ? ?INDICATIONS: The Patient is an 66 year old male with BPH and ?bladder outlet obstruction.  He has failed medical therapy and has ?elected UroLift for definitive treatment. ? ?FINDINGS OF PROCEDURE:  He was taken to the operating room where a ?genral anesthetic was induced.  He was placed in ?lithotomy position and was fitted with PAS hose.  His perineum and ?genitalia were prepped with chlorhexidine, and he was draped in usual ?sterile fashion. ? ?Cystoscopy was performed using the UroLift scope and 0 degree lens. ?Examination revealed a normal urethra.  The external sphincter was ?intact.  Prostatic urethra was approximately 5 cm in length with lateral ?lobe enlargement. There was also little bit of bladder neck elevation. ?Inspection of bladder revealed mild-to-moderate trabeculation with no ?tumors, stones, or inflammation.  No cellules or diverticula were noted. ?Ureteral orifices were in their normal anatomic position effluxing clear ?urine. ? ?After initial cystoscopy, the visual obturator was replaced with the ?first UroLift device.  This was turned to the 9 o'clock position and ?pulled back to the veru and then slightly advanced.  Pressure was ?then applied to the right lateral lobe and the UroLift device was ?deployed. ? ?The second UroLift device was then inserted and applied to the left ?lateral lobe at 3 o'clock and deployed in the mid prostatic urethra. ?After this, there was still some apparent obstruction closer to the ?bladder neck.  So a second and  third level of UroLift your left device was applied ?between the mid urethra and the proximal urethra providing further ?patency to the prostatic urethra.  At this point, there was mild ?bleeding so it was ?thought that a Foley catheter was indicated.  The scope was removed and ?a 16-French Foley catheter was inserted without difficulty. The balloon ?was filled with 10 mL sterile fluid, and the catheter was placed to ?straight drainage. ? ?COMPLICATIONS: None  ? ?CONDITION: Stable, extubated, transferred to PACU ? ?PLAN: The patient will be discharged home and followup in 2 days for a voiding trial.  ? ?

## 2021-10-04 NOTE — Anesthesia Postprocedure Evaluation (Signed)
Anesthesia Post Note ? ?Patient: MICCO BOURBEAU ? ?Procedure(s) Performed: CYSTOSCOPY WITH INSERTION OF UROLIFT (Bladder) ? ?Patient location during evaluation: Phase II ?Anesthesia Type: General ?Level of consciousness: awake ?Pain management: pain level controlled ?Vital Signs Assessment: post-procedure vital signs reviewed and stable ?Respiratory status: spontaneous breathing and respiratory function stable ?Cardiovascular status: blood pressure returned to baseline and stable ?Postop Assessment: no headache and no apparent nausea or vomiting ?Anesthetic complications: no ?Comments: Late entry ? ? ?No notable events documented. ? ? ?Last Vitals:  ?Vitals:  ? 10/04/21 0830 10/04/21 0845  ?BP: 116/69 115/74  ?Pulse: (!) 56 (!) 50  ?Resp: 12 16  ?Temp:    ?SpO2: 99% 95%  ?  ?Last Pain:  ?Vitals:  ? 10/04/21 0830  ?TempSrc:   ?PainSc: (P) 0-No pain  ? ? ?  ?  ?  ?  ?  ?  ? ?Louann Sjogren ? ? ? ? ?

## 2021-10-06 ENCOUNTER — Encounter: Payer: Self-pay | Admitting: Urology

## 2021-10-06 ENCOUNTER — Ambulatory Visit: Payer: 59 | Admitting: Urology

## 2021-10-06 ENCOUNTER — Telehealth: Payer: Self-pay

## 2021-10-06 VITALS — BP 118/71 | HR 61

## 2021-10-06 DIAGNOSIS — N401 Enlarged prostate with lower urinary tract symptoms: Secondary | ICD-10-CM

## 2021-10-06 DIAGNOSIS — R3912 Poor urinary stream: Secondary | ICD-10-CM | POA: Diagnosis not present

## 2021-10-06 NOTE — Progress Notes (Signed)
Fill and Pull Catheter Removal ? ?Patient is present today for a catheter removal.  Patient was cleaned and prepped in a sterile fashion 281m of sterile water/ saline was instilled into the bladder when the patient felt the urge to urinate. 130mof water was then drained from the balloon.  A 16FR foley cath was removed from the bladder no complications were noted .  Patient as then given some time to void on their own.  Patient can void  [id number] their own after some time.  Patient tolerated well. ? ?Performed by: Susie Ehresman LPN ? ?Follow up/ Additional notes: Per MD note  ?

## 2021-10-06 NOTE — Telephone Encounter (Signed)
Patient called asking if there were any weight restrictions after his procedure.  Verbal from Dr. Alyson Ingles no lifting or strenuous activity for 2 weeks.  Called patient and left detailed message, informed him to call back with any other questions.  ?

## 2021-10-06 NOTE — Progress Notes (Signed)
? ?10/06/2021 ?8:57 AM  ? ?Ricky Chavez ?1956-01-05 ?[id number] ? ?Referring provider: [name], MD ?[address] ?[place],  [place] [address] ? ?Followup BPH ? ? ?HPI: ? ?[name] is a 66yo here for followup for BPH and a weak urinary stream. He underwent Urolift 2 days ago. No hematuria. Voiding trial passed today.  ? ?PMH: ?Past Medical History:  ?Diagnosis Date  ? Allergic rhinitis   ? BPH with obstruction/lower urinary tract symptoms   ? flomax and finasteride qod per urol  ? Coronary artery disease   ? With preserved EF.  Needs DAPT lifetime due to high risk status.  ? COVID 09/01/2020  ? Elevated PSA onset @ 2009  ? Neg bx x 3.  MRI c/w bph only.  PSAs stable as of 11/2019. Dr. Demetrios Isaacs  ? GERD (gastroesophageal reflux disease)   ? Hearing loss of both ears   ? History of myocardial infarction 2011  ? Hyperlipidemia   ? Hypertension   ? Insomnia   ? Myocardial infarction [facility]   ? Organic impotence   ? Palpitations 05/2018  ? PVCs  ? Sciatica   ? Piriformis syndrome likely--improving with gabapentin as of 04/2017.  May need MRI L spine and/or emg/NCS  ? Squamous cell carcinoma   ? ? ?Surgical History: ?Past Surgical History:  ?Procedure Laterality Date  ? CARDIAC CATHETERIZATION  01/31/13  ? Stents to LAD patent; small diagonal stent with 90% ostial stenosis--good overall LV function with mild anterior and focal inferoapical hypokinesis, EF 60-65%.  Unsuccessful angioplasty of diagonal.  ? CARDIOVASCULAR STRESS TEST  05/26/15  ? stress echo neg for ischemia  ? CARDIOVASCULAR STRESS TEST  10/04/2017  ? Myoc perf imaging: normal (EF 59%)  ? COLONOSCOPY  2008; 06/14/16  ? 2008 Normal.  2018 hyperplastic polyp +internal hemorrhoids.  Recall 2028.  ? HYDROCELE EXCISION / REPAIR  early 2000's  ? INGUINAL HERNIA REPAIR Bilateral 1998  ? PROSTATE BIOPSY    ? Multiple; all neg.  Prostate MRI 2018 -NO SIGN OF PROSTATE CA.  ? PTCA    ? Cath 2011---4 stents--LAD and diagonal.  2014 repeat cath for CP  showed no changes.  ? Scrotal u/s  01/2015  ? numerous extratesticular cystic lesions bilat, c/w numerous epididymal cysts vs loculated hydrocele--following expectantly.  ? ? ?Home Medications:  ?Allergies as of 10/06/2021   ? ?   Reactions  ? Cephalexin Other (See Comments)  ? Cause the patient to develop Cdiff   ? ?  ? ?  ?Medication List  ?  ? ?  ? Accurate as of Oct 06, 2021  8:57 AM. If you have any questions, ask your nurse or doctor.  ?  ?  ? ?  ? ?aspirin EC 81 MG tablet ?Take 81 mg by mouth daily. ?  ?atorvastatin 40 MG tablet ?Commonly known as: LIPITOR ?Take 1 tablet (40 mg total) by mouth daily. ?  ?Avodart 0.5 MG capsule ?Generic drug: dutasteride ?TAKE 1 CAPSULE EVERY OTHER DAY ?  ?carvedilol 6.25 MG tablet ?Commonly known as: COREG ?Take 1 tablet (6.25 mg total) by mouth 2 (two) times daily. ?  ?lisinopril 5 MG tablet ?Commonly known as: ZESTRIL ?Take 1 tablet (5 mg total) by mouth daily. ?  ?melatonin 5 MG Tabs ?Take 5 mg by mouth at bedtime. ?  ?MULTIVITAMIN ADULT PO ?Take 1 capsule by mouth daily. ?  ?nitroGLYCERIN 0.4 MG/SPRAY spray ?Commonly known as: NITROLINGUAL ?Place 1 spray under the tongue  every 5 (five) minutes x 3 doses as needed for chest pain. ?  ?omeprazole 20 MG capsule ?Commonly known as: PRILOSEC ?Take 20 mg by mouth daily. ?  ?silodosin 8 MG Caps capsule ?Commonly known as: RAPAFLO ?Take 1 capsule (8 mg total) by mouth daily. ?  ?ticagrelor 60 MG Tabs tablet ?Commonly known as: Brilinta ?Take 1 tablet (60 mg total) by mouth 2 (two) times daily. ?  ?traMADol 50 MG tablet ?Commonly known as: Ultram ?Take 1 tablet (50 mg total) by mouth every 6 (six) hours as needed. ?  ? ?  ? ? ?Allergies:  ?Allergies  ?Allergen Reactions  ? Cephalexin Other (See Comments)  ?  Cause the patient to develop Cdiff   ? ? ?Family History: ?Family History  ?Problem Relation Age of Onset  ? Hyperlipidemia Mother   ? Hypertension Mother   ? Heart disease Mother 34  ?     CABG  ? Other Mother   ?      Neuroendocrine carcinoma pancreas (solid mass) and liver (diffuse)  ? Heart disease Father 19  ?     CABG  ? Hyperlipidemia Father   ? Hypertension Father   ? Colon polyps Father   ? Hypothyroidism Sister   ? Heart attack Brother 22  ?     MI  ? Prostate cancer Paternal Uncle   ? Peripheral vascular disease Brother   ?     Carotid   ? Stroke Neg Hx   ? Diabetes Neg Hx   ? ? ?Social History:  reports that he quit smoking about 43 years ago. His smoking use included cigarettes. He has a 0.50 pack-year smoking history. He has never used smokeless tobacco. He reports current alcohol use of about 14.0 standard drinks per week. He reports that he does not use drugs. ? ?ROS: ?All other review of systems were reviewed and are negative except what is noted above in HPI ? ?Physical Exam: ?BP 118/71   Pulse 61   ?Constitutional:  Alert and oriented, No acute distress. ?HEENT: [place] AT, moist mucus membranes.  Trachea midline, no masses. ?Cardiovascular: No clubbing, cyanosis, or edema. ?Respiratory: Normal respiratory effort, no increased work of breathing. ?GI: Abdomen is soft, nontender, nondistended, no abdominal masses ?GU: No CVA tenderness.  ?Lymph: No cervical or inguinal lymphadenopathy. ?Skin: No rashes, bruises or suspicious lesions. ?Neurologic: Grossly intact, no focal deficits, moving all 4 extremities. ?Psychiatric: Normal mood and affect. ? ?Laboratory Data: ?Lab Results  ?Component Value Date  ? WBC 6.3 11/26/2020  ? HGB 15.1 11/26/2020  ? HCT 45.7 11/26/2020  ? MCV 94.0 11/26/2020  ? PLT 185 11/26/2020  ? ? ?Lab Results  ?Component Value Date  ? CREATININE 1.07 06/22/2021  ? ? ?Lab Results  ?Component Value Date  ? PSA 4.4 (H) 11/25/2019  ? PSA 5.20 11/20/2019  ? PSA 5.61 05/08/2019  ? PSA 5.61 05/08/2019  ? ? ?Lab Results  ?Component Value Date  ? TESTOSTERONE 414 11/25/2019  ? ? ?Lab Results  ?Component Value Date  ? HGBA1C 5.5 11/26/2020  ? ? ?Urinalysis ?   ?Component Value Date/Time  ? APPEARANCEUR Clear  07/23/2021 1300  ? GLUCOSEU Negative 07/23/2021 1300  ? BILIRUBINUR Negative 07/23/2021 1300  ? PROTEINUR Negative 07/23/2021 1300  ? NITRITE Negative 07/23/2021 1300  ? LEUKOCYTESUR Negative 07/23/2021 1300  ? ? ?Lab Results  ?Component Value Date  ? LABMICR Comment 07/23/2021  ? WBCUA >30 (A) 04/02/2021  ? LABEPIT None seen 04/02/2021  ?  BACTERIA None seen 04/02/2021  ? ? ?Pertinent Imaging: ? ?No results found for this or any previous visit. ? ?No results found for this or any previous visit. ? ?No results found for this or any previous visit. ? ?No results found for this or any previous visit. ? ?No results found for this or any previous visit. ? ?No results found for this or any previous visit. ? ?No results found for this or any previous visit. ? ?No results found for this or any previous visit. ? ? ?Assessment & Plan:   ? ?1. Benign localized prostatic hyperplasia with lower urinary tract symptoms (LUTS) ?-RTC 2 weeks with PVR ? ? ?No follow-ups on file. ? ?Nicolette Bang, MD ? ?[facility] [place] ?  ?

## 2021-10-06 NOTE — Patient Instructions (Signed)

## 2021-10-20 ENCOUNTER — Ambulatory Visit: Payer: 59 | Admitting: Physician Assistant

## 2021-10-22 DIAGNOSIS — M25562 Pain in left knee: Secondary | ICD-10-CM | POA: Insufficient documentation

## 2021-11-02 ENCOUNTER — Encounter: Payer: Self-pay | Admitting: Urology

## 2021-11-11 ENCOUNTER — Telehealth: Payer: Self-pay

## 2021-11-11 ENCOUNTER — Other Ambulatory Visit: Payer: Self-pay

## 2021-11-11 ENCOUNTER — Encounter: Payer: Self-pay | Admitting: Cardiology

## 2021-11-11 ENCOUNTER — Other Ambulatory Visit: Payer: Self-pay | Admitting: Cardiology

## 2021-11-11 NOTE — Telephone Encounter (Signed)
Called and spoke with Jinny Blossom, pharm tech. She states the prescription is expired. Pt was seen 09/24/2021, no medication changes at that time. Sent in the medication refill.

## 2021-11-15 ENCOUNTER — Encounter: Payer: Self-pay | Admitting: Family Medicine

## 2021-11-15 ENCOUNTER — Ambulatory Visit: Payer: 59 | Admitting: Family Medicine

## 2021-11-15 VITALS — BP 114/67 | HR 52 | Temp 97.9°F | Ht 68.0 in | Wt 192.4 lb

## 2021-11-15 DIAGNOSIS — M109 Gout, unspecified: Secondary | ICD-10-CM | POA: Diagnosis not present

## 2021-11-15 MED ORDER — PREDNISONE 20 MG PO TABS: ORAL_TABLET | ORAL | 0 refills | Status: DC

## 2021-11-15 NOTE — Patient Instructions (Signed)
Low-Purine Eating Plan A low-purine eating plan involves making food choices to limit your purine intake. Purine is a kind of uric acid. Too much uric acid in your blood can cause certain conditions, such as gout and kidney stones. Eating a low-purine diet may help control these conditions. What are tips for following this plan? Shopping Avoid buying products that contain high-fructose corn syrup. Check for this on food labels. It is commonly found in many processed foods and soft drinks. Be sure to check for it in baked goods such as cookies, canned fruits, and cereals and cereal bars. Avoid buying veal, chicken breast with skin, lamb, and organ meats such as liver. These types of meats tend to have the highest purine content. Choose dairy products. These may lower uric acid levels. Avoid certain types of fish. Not all fish and seafood have high purine content. Examples with high purine content include anchovies, trout, tuna, sardines, and salmon. Avoid buying beverages that contain alcohol, particularly beer and hard liquor. Alcohol can affect the way your body gets rid of uric acid. Meal planning  Learn which foods do or do not affect you. If you find out that a food tends to cause your gout symptoms to flare up, avoid eating that food. You can enjoy foods that do not cause problems. If you have any questions about a food item, talk with your dietitian or health care provider. Reduce the overall amount of meat in your diet. When you do eat meat, choose ones with lower purine content. Include plenty of fruits and vegetables. Although some vegetables may have a high purine content--such as asparagus, mushrooms, spinach, or cauliflower--it has been shown that these do not contribute to uric acid blood levels as much. Consume at least 1 dairy serving a day. This has been shown to decrease uric acid levels. General information If you drink alcohol: Limit how much you have to: 0-1 drink a day for  women who are not pregnant. 0-2 drinks a day for men. Know how much alcohol is in a drink. In the [place] one drink equals one 12 oz bottle of beer (355 mL), one 5 oz glass of wine (148 mL), or one 1 oz glass of hard liquor (44 mL). Drink plenty of water. Try to drink enough to keep your urine pale yellow. Fluids can help remove uric acid from your body. Work with your health care provider and dietitian to develop a plan to achieve or maintain a healthy weight. Losing weight may help reduce uric acid in your blood. What foods are recommended? The following are some types of foods that are good choices when limiting purine intake: Fresh or frozen fruits and vegetables. Whole grains, breads, cereals, and pasta. Rice. Beans, peas, legumes. Nuts and seeds. Dairy products. Fats and oils. The items listed above may not be a complete list. Talk with a dietitian about what dietary choices are best for you. What foods are not recommended? Limit your intake of foods high in purines, including: Beer and other alcohol. Meat-based gravy or sauce. Canned or fresh fish, such as: Anchovies, sardines, herring, salmon, and tuna. Mussels and scallops. Codfish, trout, and haddock. Bacon, veal, chicken breast with skin, and lamb. Organ meats, such as: Liver or kidney. Tripe. Sweetbreads (thymus gland or pancreas). Wild game or goose. Yeast or yeast extract supplements. Drinks sweetened with high-fructose corn syrup, such as soda. Processed foods made with high-fructose corn syrup. The items listed above may not be a complete list of foods   and beverages you should limit. Contact a dietitian for more information. Summary Eating a low-purine diet may help control conditions caused by too much uric acid in the body, such as gout or kidney stones. Choose low-purine foods, limit alcohol, and limit high-fructose corn syrup. You will learn over time which foods do or do not affect you. If you find out that a  food tends to cause your gout symptoms to flare up, avoid eating that food. This information is not intended to replace advice given to you by your health care provider. Make sure you discuss any questions you have with your health care provider. Document Revised: 05/06/2021 Document Reviewed: 05/06/2021 Elsevier Patient Education  2023 Elsevier Inc.  

## 2021-11-15 NOTE — Progress Notes (Signed)
OFFICE VISIT  11/15/2021  CC:  Chief Complaint  Patient presents with   Foot Pain    Moderate to severe pain in left foot, involving great toe and ball of foot.  Symptoms started on June 6, only slightly responsive to treatment with tylenol/motrin. Making mobility difficult    Patient is a 66 y.o. male who presents for left foot pain.  HPI: Onset of pain 6d/a in the area of the left MTP joint.  No preceding trauma or overuse. The pain was quite intense and made him hobble when walking.  It did turn a bit deeper pink than his other toe and was warm. No history of gout.  No recent fever chills or malaise. He has been taking 800 mg of ibuprofen and 1300 mg Tylenol pretty regularly.  Past Medical History:  Diagnosis Date   Allergic rhinitis    BPH with obstruction/lower urinary tract symptoms    flomax and finasteride qod per urol   Coronary artery disease    With preserved EF.  Needs DAPT lifetime due to high risk status.   COVID 09/01/2020   Elevated PSA onset @ 2009   Neg bx x 3.  MRI c/w bph only.  PSAs stable as of 11/2019. Dr. Demetrios Isaacs   GERD (gastroesophageal reflux disease)    Hearing loss of both ears    History of myocardial infarction 2011   Hyperlipidemia    Hypertension    Insomnia    Myocardial infarction [facility])    Organic impotence    Palpitations 05/2018   PVCs   Sciatica    Piriformis syndrome likely--improving with gabapentin as of 04/2017.  May need MRI L spine and/or emg/NCS   Squamous cell carcinoma     Past Surgical History:  Procedure Laterality Date   CARDIAC CATHETERIZATION  01/31/13   Stents to LAD patent; small diagonal stent with 90% ostial stenosis--good overall LV function with mild anterior and focal inferoapical hypokinesis, EF 60-65%.  Unsuccessful angioplasty of diagonal.   CARDIOVASCULAR STRESS TEST  05/26/15   stress echo neg for ischemia   CARDIOVASCULAR STRESS TEST  10/04/2017   Myoc perf imaging: normal (EF 59%)   COLONOSCOPY   2008; 06/14/16   2008 Normal.  2018 hyperplastic polyp +internal hemorrhoids.  Recall 2028.   CYSTOSCOPY WITH INSERTION OF UROLIFT N/A 10/04/2021   Procedure: CYSTOSCOPY WITH INSERTION OF UROLIFT;  Surgeon: Cleon Gustin, MD;  Location: [facility];  Service: Urology;  Laterality: N/A;   HYDROCELE EXCISION / REPAIR  early 2000's   INGUINAL HERNIA REPAIR Bilateral 1998   PROSTATE BIOPSY     Multiple; all neg.  Prostate MRI 2018 -NO SIGN OF PROSTATE CA.   PTCA     Cath 2011---4 stents--LAD and diagonal.  2014 repeat cath for CP showed no changes.   Scrotal u/s  01/2015   numerous extratesticular cystic lesions bilat, c/w numerous epididymal cysts vs loculated hydrocele--following expectantly.    Outpatient Medications Prior to Visit  Medication Sig Dispense Refill   aspirin EC 81 MG tablet Take 81 mg by mouth daily.     atorvastatin (LIPITOR) 40 MG tablet Take 1 tablet (40 mg total) by mouth daily. 90 tablet 1   AVODART 0.5 MG capsule TAKE 1 CAPSULE EVERY OTHER DAY 30 capsule 3   COREG 6.25 MG tablet TAKE 1 TABLET TWICE A DAY 180 tablet 3   lisinopril (ZESTRIL) 5 MG tablet Take 1 tablet (5 mg total) by mouth daily. 90 tablet 3   melatonin  5 MG TABS Take 5 mg by mouth at bedtime.     Multiple Vitamins-Minerals (MULTIVITAMIN ADULT PO) Take 1 capsule by mouth daily.     omeprazole (PRILOSEC) 20 MG capsule Take 20 mg by mouth daily.     ticagrelor (BRILINTA) 60 MG TABS tablet Take 1 tablet (60 mg total) by mouth 2 (two) times daily. 180 tablet 3   nitroGLYCERIN (NITROLINGUAL) 0.4 MG/SPRAY spray Place 1 spray under the tongue every 5 (five) minutes x 3 doses as needed for chest pain. (Patient not taking: Reported on 11/15/2021) 12 g 1   silodosin (RAPAFLO) 8 MG CAPS capsule Take 1 capsule (8 mg total) by mouth daily. 90 capsule 3   traMADol (ULTRAM) 50 MG tablet Take 1 tablet (50 mg total) by mouth every 6 (six) hours as needed. 15 tablet 0   No facility-administered medications prior to visit.     Allergies  Allergen Reactions   Cephalexin Other (See Comments)    Cause the patient to develop Cdiff     ROS As per HPI  PE:    11/15/2021    8:36 AM 10/06/2021    8:35 AM 10/04/2021    9:11 AM  Vitals with BMI  Height '5\' 8"'$     Weight 192 lbs 6 oz    BMI 00.93    Systolic 818 299 371  Diastolic 67 71 69  Pulse 52 61 54     Physical Exam  Gen: Alert, well appearing.  Patient is oriented to person, place, time, and situation. AFFECT: pleasant, lucid thought and speech. Left great toe with mild erythema and swelling at the MTP joint.  Significantly tender to palpation in this area.  Remainder of the foot and ankle completely without swelling erythema or tenderness.  Range of motion of the toe is impaired in flexion extension moderately.  LABS:  Last CBC Lab Results  Component Value Date   WBC 6.3 11/26/2020   HGB 15.1 11/26/2020   HCT 45.7 11/26/2020   MCV 94.0 11/26/2020   MCH 31.1 11/26/2020   RDW 13.0 11/26/2020   PLT 185 69/67/8938   Last metabolic panel Lab Results  Component Value Date   GLUCOSE 102 (H) 06/22/2021   NA 139 06/22/2021   K 4.7 06/22/2021   CL 103 06/22/2021   CO2 26 06/22/2021   BUN 19 06/22/2021   CREATININE 1.07 06/22/2021   CALCIUM 9.7 06/22/2021   PROT 7.1 06/22/2021   ALBUMIN 3.9 09/01/2016   BILITOT 0.8 06/22/2021   ALKPHOS 72 09/01/2016   AST 26 06/22/2021   ALT 32 06/22/2021   Last hemoglobin A1c Lab Results  Component Value Date   HGBA1C 5.5 11/26/2020   IMPRESSION AND PLAN:  #1 podagra, left side. This is his first episode of gout. Low purine diet discussed, handout given. Prednisone 20 mg, 2 tabs daily x5 days. If recurrent will check uric acid level and start allopurinol.  An After Visit Summary was printed and given to the patient.  FOLLOW UP: Return if symptoms worsen or fail to improve.  Signed:  Crissie Sickles, MD           11/15/2021

## 2021-11-19 ENCOUNTER — Other Ambulatory Visit: Payer: Self-pay | Admitting: Cardiology

## 2021-11-22 ENCOUNTER — Other Ambulatory Visit: Payer: Self-pay | Admitting: Cardiology

## 2021-11-29 ENCOUNTER — Encounter: Payer: Self-pay | Admitting: Cardiology

## 2021-11-29 MED ORDER — CARVEDILOL 6.25 MG PO TABS: 6.2500 mg | ORAL_TABLET | Freq: Two times a day (BID) | ORAL | 2 refills | Status: DC

## 2021-12-06 ENCOUNTER — Other Ambulatory Visit: Payer: Self-pay | Admitting: Cardiology

## 2021-12-21 ENCOUNTER — Encounter: Payer: Self-pay | Admitting: Family Medicine

## 2021-12-21 ENCOUNTER — Ambulatory Visit (INDEPENDENT_AMBULATORY_CARE_PROVIDER_SITE_OTHER): Payer: 59 | Admitting: Family Medicine

## 2021-12-21 VITALS — BP 105/71 | HR 61 | Temp 97.9°F | Ht 68.5 in | Wt 184.4 lb

## 2021-12-21 DIAGNOSIS — I10 Essential (primary) hypertension: Secondary | ICD-10-CM

## 2021-12-21 DIAGNOSIS — Z125 Encounter for screening for malignant neoplasm of prostate: Secondary | ICD-10-CM | POA: Diagnosis not present

## 2021-12-21 DIAGNOSIS — Z0000 Encounter for general adult medical examination without abnormal findings: Secondary | ICD-10-CM

## 2021-12-21 DIAGNOSIS — E7800 Pure hypercholesterolemia, unspecified: Secondary | ICD-10-CM | POA: Diagnosis not present

## 2021-12-21 DIAGNOSIS — R7301 Impaired fasting glucose: Secondary | ICD-10-CM

## 2021-12-21 NOTE — Progress Notes (Signed)
Office Note 12/21/2021  CC:  Chief Complaint  Patient presents with   Annual Exam    Pt is fasting   Patient is a 66 y.o. male who is here for annual health maintenance exam and 15-monthfollow-up hypertension, hyperlipidemia, and prediabetes. He has known coronary artery disease and is followed by cardiology, DAPT indefinitely. A/P as of last visit: "#1 hypertension.  Blood pressure great.  Continue lisinopril 5 mg a day and carvedilol 6.25 mg twice daily. Electrolytes and creatinine today.  2.  Hypercholesterolemia, well controlled on atorvastatin 40 mg a day. Last LDL about 6 months ago was 69.  He is not fasting today.  Plan repeat cholesterol in 6 months.  3.  Overweight, BMI 29. He is interested in appetite suppressant but phentermine is contraindicated in him due to his history of cardiac disease. I asked him to check with his insurance about coverage for Ozempic, Mounjaro, or Victoza.   4. CAD; asymptomatic.  Statin and BB. DAPT indefinitely per cards.  INTERIM HX:   RLiliane Channelis feeling well.  He had a left knee meniscus injury that fortunately healed up with rest and home rehab. Additionally, he had UroLift procedure in May of this year and says this has improved his urine stream and he was able to get off one of his medications.  He is getting back into the swing of exercise pretty soon, had been hampered by his knee but this is better now. Blood pressures have been normal.   Past Medical History:  Diagnosis Date   Allergic rhinitis    BPH with obstruction/lower urinary tract symptoms    flomax and finasteride qod per urol   Coronary artery disease    With preserved EF.  Needs DAPT lifetime due to high risk status.   COVID 09/01/2020   Elevated PSA onset @ 2009   Neg bx x 3.  MRI c/w bph only.  PSAs stable as of 11/2019. Dr. MDemetrios Isaacs  GERD (gastroesophageal reflux disease)    Hearing loss of both ears    History of myocardial infarction 2011    Hyperlipidemia    Hypertension    Insomnia    Myocardial infarction ([facility]    Organic impotence    Palpitations 05/2018   PVCs   Sciatica    Piriformis syndrome likely--improving with gabapentin as of 04/2017.  May need MRI L spine and/or emg/NCS   Squamous cell carcinoma     Past Surgical History:  Procedure Laterality Date   CARDIAC CATHETERIZATION  01/31/13   Stents to LAD patent; small diagonal stent with 90% ostial stenosis--good overall LV function with mild anterior and focal inferoapical hypokinesis, EF 60-65%.  Unsuccessful angioplasty of diagonal.   CARDIOVASCULAR STRESS TEST  05/26/15   stress echo neg for ischemia   CARDIOVASCULAR STRESS TEST  10/04/2017   Myoc perf imaging: normal (EF 59%)   COLONOSCOPY  2008; 06/14/16   2008 Normal.  2018 hyperplastic polyp +internal hemorrhoids.  Recall 2028.   CYSTOSCOPY WITH INSERTION OF UROLIFT N/A 10/04/2021   Procedure: CYSTOSCOPY WITH INSERTION OF UROLIFT;  Surgeon: MCleon Gustin MD;  Location: [facility];  Service: Urology;  Laterality: N/A;   HYDROCELE EXCISION / REPAIR  early 2000's   INGUINAL HERNIA REPAIR Bilateral 1998   PROSTATE BIOPSY     Multiple; all neg.  Prostate MRI 2018 -NO SIGN OF PROSTATE CA.   PTCA     Cath 2011---4 stents--LAD and diagonal.  2014 repeat cath for CP showed no changes.  Scrotal u/s  01/2015   numerous extratesticular cystic lesions bilat, c/w numerous epididymal cysts vs loculated hydrocele--following expectantly.    Family History  Problem Relation Age of Onset   Hyperlipidemia Mother    Hypertension Mother    Heart disease Mother 7       CABG   Other Mother        Neuroendocrine carcinoma pancreas (solid mass) and liver (diffuse)   Heart disease Father 37       CABG   Hyperlipidemia Father    Hypertension Father    Colon polyps Father    Hypothyroidism Sister    Heart attack Brother 25       MI   Prostate cancer Paternal Uncle    Peripheral vascular disease Brother         Carotid    Stroke Neg Hx    Diabetes Neg Hx     Social History   Socioeconomic History   Marital status: Married    Spouse name: Not on file   Number of children: 2   Years of education: Not on file   Highest education level: Master's degree (e.g., MA, MS, MEng, MEd, MSW, MBA)  Occupational History   Occupation: retired  Tobacco Use   Smoking status: Former    Packs/day: 0.25    Years: 2.00    Total pack years: 0.50    Types: Cigarettes    Quit date: 06/06/1978    Years since quitting: 43.5   Smokeless tobacco: Never  Vaping Use   Vaping Use: Never used  Substance and Sexual Activity   Alcohol use: Yes    Alcohol/week: 14.0 standard drinks of alcohol    Types: 14 Glasses of wine per week   Drug use: No   Sexual activity: Yes  Other Topics Concern   Not on file  Social History Narrative   Married, 2 grown children.   Relocated to [place] from [facility] 2016.   Educ: Masters degree   Occup: Retired Government social research officer with [employer]   No tob.   Alc: 1 glass wine/night.   Social Determinants of Health   Financial Resource Strain: Low Risk  (06/18/2021)   Overall Financial Resource Strain (CARDIA)    Difficulty of Paying Living Expenses: Not hard at all  Food Insecurity: No Food Insecurity (06/18/2021)   Hunger Vital Sign    Worried About Running Out of Food in the Last Year: Never true    Ran Out of Food in the Last Year: Never true  Transportation Needs: No Transportation Needs (06/18/2021)   PRAPARE - Hydrologist (Medical): No    Lack of Transportation (Non-Medical): No  Physical Activity: Insufficiently Active (06/18/2021)   Exercise Vital Sign    Days of Exercise per Week: 3 days    Minutes of Exercise per Session: 30 min  Stress: No Stress Concern Present (06/18/2021)   [place]    Feeling of Stress : Not at all  Social Connections: [place] (06/18/2021)    Social Connection and Isolation Panel [NHANES]    Frequency of Communication with Friends and Family: More than three times a week    Frequency of Social Gatherings with Friends and Family: Twice a week    Attends Religious Services: More than 4 times per year    Active Member of [employer] or Organizations: Yes    Attends Archivist Meetings: More than 4 times per year  Marital Status: Married  Human resources officer Violence: Not on file    Outpatient Medications Prior to Visit  Medication Sig Dispense Refill   aspirin EC 81 MG tablet Take 81 mg by mouth daily.     atorvastatin (LIPITOR) 40 MG tablet TAKE 1 TABLET DAILY 90 tablet 3   AVODART 0.5 MG capsule TAKE 1 CAPSULE EVERY OTHER DAY 30 capsule 3   carvedilol (COREG) 6.25 MG tablet Take 1 tablet (6.25 mg total) by mouth 2 (two) times daily. 180 tablet 2   lisinopril (ZESTRIL) 5 MG tablet Take 1 tablet (5 mg total) by mouth daily. 90 tablet 3   melatonin 5 MG TABS Take 5 mg by mouth at bedtime.     Multiple Vitamins-Minerals (MULTIVITAMIN ADULT PO) Take 1 capsule by mouth daily.     nitroGLYCERIN (NITROLINGUAL) 0.4 MG/SPRAY spray Place 1 spray under the tongue every 5 (five) minutes x 3 doses as needed for chest pain. 12 g 1   omeprazole (PRILOSEC) 20 MG capsule Take 20 mg by mouth daily.     ticagrelor (BRILINTA) 60 MG TABS tablet Take 1 tablet (60 mg total) by mouth 2 (two) times daily. 180 tablet 3   predniSONE (DELTASONE) 20 MG tablet 2 tabs po qd x 5d 10 tablet 0   No facility-administered medications prior to visit.    Allergies  Allergen Reactions   Cephalexin Other (See Comments)    Cause the patient to develop Cdiff     ROS Review of Systems  Constitutional:  Negative for appetite change, chills, fatigue and fever.  HENT:  Negative for congestion, dental problem, ear pain and sore throat.   Eyes:  Negative for discharge, redness and visual disturbance.  Respiratory:  Negative for cough, chest tightness, shortness  of breath and wheezing.   Cardiovascular:  Negative for chest pain, palpitations and leg swelling.  Gastrointestinal:  Negative for abdominal pain, blood in stool, diarrhea, nausea and vomiting.  Genitourinary:  Negative for difficulty urinating, dysuria, flank pain, frequency, hematuria and urgency.  Musculoskeletal:  Negative for arthralgias, back pain, joint swelling, myalgias and neck stiffness.  Skin:  Negative for pallor and rash.  Neurological:  Negative for dizziness, speech difficulty, weakness and headaches.  Hematological:  Negative for adenopathy. Does not bruise/bleed easily.  Psychiatric/Behavioral:  Negative for confusion and sleep disturbance. The patient is not nervous/anxious.     PE;    12/21/2021    8:38 AM 11/15/2021    8:36 AM 10/06/2021    8:35 AM  Vitals with BMI  Height 5' 8.504" '5\' 8"'$    Weight 184 lbs 6 oz 192 lbs 6 oz   BMI 35.59 74.16   Systolic 384 536 468  Diastolic 71 67 71  Pulse 61 52 61     Gen: Alert, well appearing.  Patient is oriented to person, place, time, and situation. AFFECT: pleasant, lucid thought and speech. ENT: Ears: EACs clear, normal epithelium.  TMs with good light reflex and landmarks bilaterally.  Eyes: no injection, icteris, swelling, or exudate.  EOMI, PERRLA. Nose: no drainage or turbinate edema/swelling.  No injection or focal lesion.  Mouth: lips without lesion/swelling.  Oral mucosa pink and moist.  Dentition intact and without obvious caries or gingival swelling.  Oropharynx without erythema, exudate, or swelling.  Neck: supple/nontender.  No LAD, mass, or TM.  Carotid pulses 2+ bilaterally, without bruits. CV: RRR, no m/r/g.   LUNGS: CTA bilat, nonlabored resps, good aeration in all lung fields. ABD: soft, NT, ND, BS  normal.  No hepatospenomegaly or mass.  No bruits. EXT: no clubbing, cyanosis, or edema.  Musculoskeletal: no joint swelling, erythema, warmth, or tenderness.  ROM of all joints intact. Skin - no sores or  suspicious lesions or rashes or color changes  Pertinent labs:  Lab Results  Component Value Date   TSH 1.85 11/26/2020   Lab Results  Component Value Date   WBC 6.3 11/26/2020   HGB 15.1 11/26/2020   HCT 45.7 11/26/2020   MCV 94.0 11/26/2020   PLT 185 11/26/2020   Lab Results  Component Value Date   CREATININE 1.07 06/22/2021   BUN 19 06/22/2021   NA 139 06/22/2021   K 4.7 06/22/2021   CL 103 06/22/2021   CO2 26 06/22/2021   Lab Results  Component Value Date   ALT 32 06/22/2021   AST 26 06/22/2021   ALKPHOS 72 09/01/2016   BILITOT 0.8 06/22/2021   Lab Results  Component Value Date   CHOL 137 11/26/2020   Lab Results  Component Value Date   HDL 49 11/26/2020   Lab Results  Component Value Date   LDLCALC 69 11/26/2020   Lab Results  Component Value Date   TRIG 111 11/26/2020   Lab Results  Component Value Date   CHOLHDL 2.8 11/26/2020   Lab Results  Component Value Date   PSA 4.4 (H) 11/25/2019   PSA 5.20 11/20/2019   PSA 5.61 05/08/2019   PSA 5.61 05/08/2019   Lab Results  Component Value Date   HGBA1C 5.5 11/26/2020   ASSESSMENT AND PLAN:   #1 hypertension, well controlled on lisinopril 5 mg a day and Coreg 6.25 mg twice daily. Electrolytes and creatinine today.  2.  Hyperlipidemia, doing well on atorvastatin 40 mg a day Lipid panel and hepatic panel today.  3.  Impaired fasting glucose.  He has had a history of very mild elevated fasting glucose.  A1c max has been a 5.7%. Checking fasting glucose and hemoglobin A1c today.  4. Health maintenance exam: Reviewed age and gender appropriate health maintenance issues (prudent diet, regular exercise, health risks of tobacco and excessive alcohol, use of seatbelts, fire alarms in home, use of sunscreen).  Also reviewed age and gender appropriate health screening as well as vaccine recommendations. Vaccines: All up-to-date. Labs: Fasting health panel, hemoglobin A1c, and PSA. Prostate ca  screening: PSA ordered Colon ca screening: recall 2028 Skin cancer screening--he goes to a dermatologist annually for this.  An After Visit Summary was printed and given to the patient.  FOLLOW UP:  No follow-ups on file.  Signed:  Crissie Sickles, MD           12/21/2021

## 2021-12-21 NOTE — Patient Instructions (Signed)
Health Maintenance, Male Adopting a healthy lifestyle and getting preventive care are important in promoting health and wellness. Ask your health care provider about: The right schedule for you to have regular tests and exams. Things you can do on your own to prevent diseases and keep yourself healthy. What should I know about diet, weight, and exercise? Eat a healthy diet  Eat a diet that includes plenty of vegetables, fruits, low-fat dairy products, and lean protein. Do not eat a lot of foods that are high in solid fats, added sugars, or sodium. Maintain a healthy weight Body mass index (BMI) is a measurement that can be used to identify possible weight problems. It estimates body fat based on height and weight. Your health care provider can help determine your BMI and help you achieve or maintain a healthy weight. Get regular exercise Get regular exercise. This is one of the most important things you can do for your health. Most adults should: Exercise for at least 150 minutes each week. The exercise should increase your heart rate and make you sweat (moderate-intensity exercise). Do strengthening exercises at least twice a week. This is in addition to the moderate-intensity exercise. Spend less time sitting. Even light physical activity can be beneficial. Watch cholesterol and blood lipids Have your blood tested for lipids and cholesterol at 65 years of age, then have this test every 5 years. You may need to have your cholesterol levels checked more often if: Your lipid or cholesterol levels are high. You are older than 65 years of age. You are at high risk for heart disease. What should I know about cancer screening? Many types of cancers can be detected early and may often be prevented. Depending on your health history and family history, you may need to have cancer screening at various ages. This may include screening for: Colorectal cancer. Prostate cancer. Skin cancer. Lung  cancer. What should I know about heart disease, diabetes, and high blood pressure? Blood pressure and heart disease High blood pressure causes heart disease and increases the risk of stroke. This is more likely to develop in people who have high blood pressure readings or are overweight. Talk with your health care provider about your target blood pressure readings. Have your blood pressure checked: Every 3-5 years if you are 18-39 years of age. Every year if you are 40 years old or older. If you are between the ages of 65 and 75 and are a current or former smoker, ask your health care provider if you should have a one-time screening for abdominal aortic aneurysm (AAA). Diabetes Have regular diabetes screenings. This checks your fasting blood sugar level. Have the screening done: Once every three years after age 45 if you are at a normal weight and have a low risk for diabetes. More often and at a younger age if you are overweight or have a high risk for diabetes. What should I know about preventing infection? Hepatitis B If you have a higher risk for hepatitis B, you should be screened for this virus. Talk with your health care provider to find out if you are at risk for hepatitis B infection. Hepatitis C Blood testing is recommended for: Everyone born from 1945 through 1965. Anyone with known risk factors for hepatitis C. Sexually transmitted infections (STIs) You should be screened each year for STIs, including gonorrhea and chlamydia, if: You are sexually active and are younger than 65 years of age. You are older than 65 years of age and your   health care provider tells you that you are at risk for this type of infection. Your sexual activity has changed since you were last screened, and you are at increased risk for chlamydia or gonorrhea. Ask your health care provider if you are at risk. Ask your health care provider about whether you are at high risk for HIV. Your health care provider  may recommend a prescription medicine to help prevent HIV infection. If you choose to take medicine to prevent HIV, you should first get tested for HIV. You should then be tested every 3 months for as long as you are taking the medicine. Follow these instructions at home: Alcohol use Do not drink alcohol if your health care provider tells you not to drink. If you drink alcohol: Limit how much you have to 0-2 drinks a day. Know how much alcohol is in your drink. In the [place] one drink equals one 12 oz bottle of beer (355 mL), one 5 oz glass of wine (148 mL), or one 1 oz glass of hard liquor (44 mL). Lifestyle Do not use any products that contain nicotine or tobacco. These products include cigarettes, chewing tobacco, and vaping devices, such as e-cigarettes. If you need help quitting, ask your health care provider. Do not use street drugs. Do not share needles. Ask your health care provider for help if you need support or information about quitting drugs. General instructions Schedule regular health, dental, and eye exams. Stay current with your vaccines. Tell your health care provider if: You often feel depressed. You have ever been abused or do not feel safe at home. Summary Adopting a healthy lifestyle and getting preventive care are important in promoting health and wellness. Follow your health care provider's instructions about healthy diet, exercising, and getting tested or screened for diseases. Follow your health care provider's instructions on monitoring your cholesterol and blood pressure. This information is not intended to replace advice given to you by your health care provider. Make sure you discuss any questions you have with your health care provider. Document Revised: 10/12/2020 Document Reviewed: 10/12/2020 Elsevier Patient Education  2023 [employer]  

## 2021-12-22 LAB — HEMOGLOBIN A1C
Hgb A1c MFr Bld: 5.6 % of total Hgb (ref <5.7)
Mean Plasma Glucose: 114 mg/dL
eAG (mmol/L): 6.3 mmol/L

## 2021-12-22 LAB — CBC
HCT: 44.4 % (ref 38.5–50.0)
Hemoglobin: 15.3 g/dL (ref 13.2–17.1)
MCH: 31 pg (ref 27.0–33.0)
MCHC: 34.5 g/dL (ref 32.0–36.0)
MCV: 89.9 fL (ref 80.0–100.0)
MPV: 9.3 fL (ref 7.5–12.5)
Platelets: 194 10*3/uL (ref 140–400)
RBC: 4.94 10*6/uL (ref 4.20–5.80)
RDW: 12.8 % (ref 11.0–15.0)
WBC: 10.3 10*3/uL (ref 3.8–10.8)

## 2021-12-22 LAB — COMPREHENSIVE METABOLIC PANEL
AG Ratio: 1.5 (calc) (ref 1.0–2.5)
ALT: 32 U/L (ref 9–46)
AST: 24 U/L (ref 10–35)
Albumin: 4.2 g/dL (ref 3.6–5.1)
Alkaline phosphatase (APISO): 79 U/L (ref 35–144)
BUN: 14 mg/dL (ref 7–25)
CO2: 25 mmol/L (ref 20–32)
Calcium: 9.4 mg/dL (ref 8.6–10.3)
Chloride: 105 mmol/L (ref 98–110)
Creat: 0.99 mg/dL (ref 0.70–1.35)
Globulin: 2.8 g/dL (calc) (ref 1.9–3.7)
Glucose, Bld: 104 mg/dL — ABNORMAL HIGH (ref 65–99)
Potassium: 4.8 mmol/L (ref 3.5–5.3)
Sodium: 140 mmol/L (ref 135–146)
Total Bilirubin: 0.8 mg/dL (ref 0.2–1.2)
Total Protein: 7 g/dL (ref 6.1–8.1)

## 2021-12-22 LAB — LIPID PANEL
Cholesterol: 141 mg/dL (ref <200)
HDL: 46 mg/dL (ref >=40)
LDL Cholesterol (Calc): 73 mg/dL (calc)
Non-HDL Cholesterol (Calc): 95 mg/dL (calc) (ref <130)
Total CHOL/HDL Ratio: 3.1 (calc) (ref <5.0)
Triglycerides: 140 mg/dL (ref <150)

## 2021-12-22 LAB — PSA: PSA: 6.62 ng/mL — ABNORMAL HIGH (ref <=4.00)

## 2021-12-22 LAB — TSH: TSH: 1.25 mIU/L (ref 0.40–4.50)

## 2022-01-05 ENCOUNTER — Encounter: Payer: Self-pay | Admitting: Urology

## 2022-01-05 ENCOUNTER — Ambulatory Visit (INDEPENDENT_AMBULATORY_CARE_PROVIDER_SITE_OTHER): Payer: 59 | Admitting: Urology

## 2022-01-05 VITALS — BP 115/69 | HR 60

## 2022-01-05 DIAGNOSIS — R972 Elevated prostate specific antigen [PSA]: Secondary | ICD-10-CM | POA: Diagnosis not present

## 2022-01-05 DIAGNOSIS — N401 Enlarged prostate with lower urinary tract symptoms: Secondary | ICD-10-CM | POA: Diagnosis not present

## 2022-01-05 DIAGNOSIS — N3000 Acute cystitis without hematuria: Secondary | ICD-10-CM

## 2022-01-05 LAB — URINALYSIS, ROUTINE W REFLEX MICROSCOPIC
Bilirubin, UA: NEGATIVE
Glucose, UA: NEGATIVE
Ketones, UA: NEGATIVE
Nitrite, UA: POSITIVE — AB
Protein,UA: NEGATIVE
Specific Gravity, UA: <1.005 — ABNORMAL LOW (ref 1.005–1.030)
Urobilinogen, Ur: 0.2 mg/dL (ref 0.2–1.0)
pH, UA: 5.5 (ref 5.0–7.5)

## 2022-01-05 LAB — MICROSCOPIC EXAMINATION
Epithelial Cells (non renal): NONE SEEN /hpf (ref 0–10)
Renal Epithel, UA: NONE SEEN /hpf
WBC, UA: >30 /hpf — AB (ref 0–5)

## 2022-01-05 MED ORDER — NITROFURANTOIN MONOHYD MACRO 100 MG PO CAPS: 100.0000 mg | ORAL_CAPSULE | Freq: Two times a day (BID) | ORAL | 0 refills | Status: DC

## 2022-01-05 NOTE — Patient Instructions (Signed)
Urinary Tract Infection, Adult  A urinary tract infection (UTI) is an infection of any part of the urinary tract. The urinary tract includes the kidneys, ureters, bladder, and urethra. These organs make, store, and get rid of urine in the body. An upper UTI affects the ureters and kidneys. A lower UTI affects the bladder and urethra. What are the causes? Most urinary tract infections are caused by bacteria in your genital area around your urethra, where urine leaves your body. These bacteria grow and cause inflammation of your urinary tract. What increases the risk? You are more likely to develop this condition if: You have a urinary catheter that stays in place. You are not able to control when you urinate or have a bowel movement (incontinence). You are male and you: Use a spermicide or diaphragm for birth control. Have low estrogen levels. Are pregnant. You have certain genes that increase your risk. You are sexually active. You take antibiotic medicines. You have a condition that causes your flow of urine to slow down, such as: An enlarged prostate, if you are male. Blockage in your urethra. A kidney stone. A nerve condition that affects your bladder control (neurogenic bladder). Not getting enough to drink, or not urinating often. You have certain medical conditions, such as: Diabetes. A weak disease-fighting system (immunesystem). Sickle cell disease. Gout. Spinal cord injury. What are the signs or symptoms? Symptoms of this condition include: Needing to urinate right away (urgency). Frequent urination. This may include small amounts of urine each time you urinate. Pain or burning with urination. Blood in the urine. Urine that smells bad or unusual. Trouble urinating. Cloudy urine. Vaginal discharge, if you are male. Pain in the abdomen or the lower back. You may also have: Vomiting or a decreased appetite. Confusion. Irritability or tiredness. A fever or  chills. Diarrhea. The first symptom in older adults may be confusion. In some cases, they may not have any symptoms until the infection has worsened. How is this diagnosed? This condition is diagnosed based on your medical history and a physical exam. You may also have other tests, including: Urine tests. Blood tests. Tests for STIs (sexually transmitted infections). If you have had more than one UTI, a cystoscopy or imaging studies may be done to determine the cause of the infections. How is this treated? Treatment for this condition includes: Antibiotic medicine. Over-the-counter medicines to treat discomfort. Drinking enough water to stay hydrated. If you have frequent infections or have other conditions such as a kidney stone, you may need to see a health care provider who specializes in the urinary tract (urologist). In rare cases, urinary tract infections can cause sepsis. Sepsis is a life-threatening condition that occurs when the body responds to an infection. Sepsis is treated in the hospital with IV antibiotics, fluids, and other medicines. Follow these instructions at home:  Medicines Take over-the-counter and prescription medicines only as told by your health care provider. If you were prescribed an antibiotic medicine, take it as told by your health care provider. Do not stop using the antibiotic even if you start to feel better. General instructions Make sure you: Empty your bladder often and completely. Do not hold urine for long periods of time. Empty your bladder after sex. Wipe from front to back after urinating or having a bowel movement if you are male. Use each tissue only one time when you wipe. Drink enough fluid to keep your urine pale yellow. Keep all follow-up visits. This is important. Contact a health   care provider if: Your symptoms do not get better after 1-2 days. Your symptoms go away and then return. Get help right away if: You have severe pain in  your back or your lower abdomen. You have a fever or chills. You have nausea or vomiting. Summary A urinary tract infection (UTI) is an infection of any part of the urinary tract, which includes the kidneys, ureters, bladder, and urethra. Most urinary tract infections are caused by bacteria in your genital area. Treatment for this condition often includes antibiotic medicines. If you were prescribed an antibiotic medicine, take it as told by your health care provider. Do not stop using the antibiotic even if you start to feel better. Keep all follow-up visits. This is important. This information is not intended to replace advice given to you by your health care provider. Make sure you discuss any questions you have with your health care provider. Document Revised: 01/03/2020 Document Reviewed: 01/03/2020 Elsevier Patient Education  2023 Elsevier Inc.  

## 2022-01-05 NOTE — Progress Notes (Signed)
01/05/2022 11:13 AM   Ricky Chavez [birth year] [id number]  Referring provider: [name], MD [address] [place],  [place] [address]  dysuria   HPI: Ricky Chavez is a 66yo here for folllowup for BPH. 1 week ago he developed dysuria and increased urinary frequency and urgency. UA is concerning for infection. PVR 199cc. IPSS 11 QOL 2.    PMH: Past Medical History:  Diagnosis Date   Allergic rhinitis    BPH with obstruction/lower urinary tract symptoms    flomax and finasteride qod per urol   Coronary artery disease    With preserved EF.  Needs DAPT lifetime due to high risk status.   COVID 09/01/2020   Elevated PSA onset @ 2009   Neg bx x 3.  MRI c/w bph only.  PSAs stable as of 11/2019. Dr. Demetrios Isaacs   GERD (gastroesophageal reflux disease)    Hearing loss of both ears    History of meniscal tear 2023   L knee   History of myocardial infarction 2011   Hyperlipidemia    Hypertension    Insomnia    Myocardial infarction [facility])    Organic impotence    Palpitations 05/2018   PVCs   Sciatica    Piriformis syndrome likely--improving with gabapentin as of 04/2017.  May need MRI L spine and/or emg/NCS   Squamous cell carcinoma     Surgical History: Past Surgical History:  Procedure Laterality Date   CARDIAC CATHETERIZATION  01/31/13   Stents to LAD patent; small diagonal stent with 90% ostial stenosis--good overall LV function with mild anterior and focal inferoapical hypokinesis, EF 60-65%.  Unsuccessful angioplasty of diagonal.   CARDIOVASCULAR STRESS TEST  05/26/15   stress echo neg for ischemia   CARDIOVASCULAR STRESS TEST  10/04/2017   Myoc perf imaging: normal (EF 59%)   COLONOSCOPY  2008; 06/14/16   2008 Normal.  2018 hyperplastic polyp +internal hemorrhoids.  Recall 2028.   CYSTOSCOPY WITH INSERTION OF UROLIFT N/A 10/04/2021   Procedure: CYSTOSCOPY WITH INSERTION OF UROLIFT;  Surgeon: Cleon Gustin, MD;  Location: [facility];  Service:  Urology;  Laterality: N/A;   HYDROCELE EXCISION / REPAIR  early 2000's   INGUINAL HERNIA REPAIR Bilateral 1998   PROSTATE BIOPSY     Multiple; all neg.  Prostate MRI 2018 -NO SIGN OF PROSTATE CA.   PTCA     Cath 2011---4 stents--LAD and diagonal.  2014 repeat cath for CP showed no changes.   Scrotal u/s  01/2015   numerous extratesticular cystic lesions bilat, c/w numerous epididymal cysts vs loculated hydrocele--following expectantly.    Home Medications:  Allergies as of 01/05/2022       Reactions   Cephalexin Other (See Comments)   Cause the patient to develop Cdiff         Medication List        Accurate as of January 05, 2022 11:13 AM. If you have any questions, ask your nurse or doctor.          aspirin EC 81 MG tablet Take 81 mg by mouth daily.   atorvastatin 40 MG tablet Commonly known as: LIPITOR TAKE 1 TABLET DAILY   Avodart 0.5 MG capsule Generic drug: dutasteride TAKE 1 CAPSULE EVERY OTHER DAY   carvedilol 6.25 MG tablet Commonly known as: Coreg Take 1 tablet (6.25 mg total) by mouth 2 (two) times daily.   lisinopril 5 MG tablet Commonly known as: ZESTRIL Take 1 tablet (5 mg total)  by mouth daily.   melatonin 5 MG Tabs Take 5 mg by mouth at bedtime.   MULTIVITAMIN ADULT PO Take 1 capsule by mouth daily.   nitroGLYCERIN 0.4 MG/SPRAY spray Commonly known as: NITROLINGUAL Place 1 spray under the tongue every 5 (five) minutes x 3 doses as needed for chest pain.   omeprazole 20 MG capsule Commonly known as: PRILOSEC Take 20 mg by mouth daily.   ticagrelor 60 MG Tabs tablet Commonly known as: Brilinta Take 1 tablet (60 mg total) by mouth 2 (two) times daily.        Allergies:  Allergies  Allergen Reactions   Cephalexin Other (See Comments)    Cause the patient to develop Cdiff     Family History: Family History  Problem Relation Age of Onset   Hyperlipidemia Mother    Hypertension Mother    Heart disease Mother 32       CABG    Other Mother        Neuroendocrine carcinoma pancreas (solid mass) and liver (diffuse)   Heart disease Father 31       CABG   Hyperlipidemia Father    Hypertension Father    Colon polyps Father    Hypothyroidism Sister    Heart attack Brother 45       MI   Prostate cancer Paternal Uncle    Peripheral vascular disease Brother        Carotid    Stroke Neg Hx    Diabetes Neg Hx     Social History:  reports that he quit smoking about 43 years ago. His smoking use included cigarettes. He has a 0.50 pack-year smoking history. He has never used smokeless tobacco. He reports current alcohol use of about 14.0 standard drinks of alcohol per week. He reports that he does not use drugs.  ROS: All other review of systems were reviewed and are negative except what is noted above in HPI  Physical Exam: BP 115/69   Pulse 60   Constitutional:  Alert and oriented, No acute distress. HEENT: [place] AT, moist mucus membranes.  Trachea midline, no masses. Cardiovascular: No clubbing, cyanosis, or edema. Respiratory: Normal respiratory effort, no increased work of breathing. GI: Abdomen is soft, nontender, nondistended, no abdominal masses GU: No CVA tenderness.  Lymph: No cervical or inguinal lymphadenopathy. Skin: No rashes, bruises or suspicious lesions. Neurologic: Grossly intact, no focal deficits, moving all 4 extremities. Psychiatric: Normal mood and affect.  Laboratory Data: Lab Results  Component Value Date   WBC 10.3 12/21/2021   HGB 15.3 12/21/2021   HCT 44.4 12/21/2021   MCV 89.9 12/21/2021   PLT 194 12/21/2021    Lab Results  Component Value Date   CREATININE 0.99 12/21/2021    Lab Results  Component Value Date   PSA 6.62 (H) 12/21/2021   PSA 4.4 (H) 11/25/2019   PSA 5.20 11/20/2019    Lab Results  Component Value Date   TESTOSTERONE 414 11/25/2019    Lab Results  Component Value Date   HGBA1C 5.6 12/21/2021    Urinalysis    Component Value Date/Time    APPEARANCEUR Clear 07/23/2021 1300   GLUCOSEU Negative 07/23/2021 1300   BILIRUBINUR Negative 07/23/2021 1300   PROTEINUR Negative 07/23/2021 1300   NITRITE Negative 07/23/2021 1300   LEUKOCYTESUR Negative 07/23/2021 1300    Lab Results  Component Value Date   LABMICR Comment 07/23/2021   WBCUA >30 (A) 04/02/2021   LABEPIT None seen 04/02/2021   BACTERIA None  seen 04/02/2021    Pertinent Imaging:  No results found for this or any previous visit.  No results found for this or any previous visit.  No results found for this or any previous visit.  No results found for this or any previous visit.  No results found for this or any previous visit.  No results found for this or any previous visit.  No results found for this or any previous visit.  No results found for this or any previous visit.   Assessment & Plan:    1. Benign localized prostatic hyperplasia with lower urinary tract symptoms (LUTS) -improved after urolift - BLADDER SCAN AMB NON-IMAGING - Urinalysis, Routine w reflex microscopic  2.  Acute cystitis without hematuria - Urine Culture -macrobid '100mg'$  BID for 7 days   No follow-ups on file.  Nicolette Bang, MD  [facility] [place]

## 2022-01-05 NOTE — Progress Notes (Signed)
post void residual=199

## 2022-01-10 LAB — URINE CULTURE

## 2022-01-17 ENCOUNTER — Encounter: Payer: Self-pay | Admitting: Urology

## 2022-01-17 ENCOUNTER — Ambulatory Visit (INDEPENDENT_AMBULATORY_CARE_PROVIDER_SITE_OTHER): Payer: 59 | Admitting: Urology

## 2022-01-17 VITALS — BP 120/77 | HR 61 | Ht 69.0 in | Wt 190.0 lb

## 2022-01-17 DIAGNOSIS — R3914 Feeling of incomplete bladder emptying: Secondary | ICD-10-CM

## 2022-01-17 DIAGNOSIS — R300 Dysuria: Secondary | ICD-10-CM | POA: Diagnosis not present

## 2022-01-17 DIAGNOSIS — N390 Urinary tract infection, site not specified: Secondary | ICD-10-CM

## 2022-01-17 LAB — BLADDER SCAN AMB NON-IMAGING: Scan Result: 330

## 2022-01-17 MED ORDER — SULFAMETHOXAZOLE-TRIMETHOPRIM 800-160 MG PO TABS: 1.0000 | ORAL_TABLET | Freq: Two times a day (BID) | ORAL | 0 refills | Status: DC

## 2022-01-17 NOTE — Progress Notes (Signed)
01/17/2022 4:06 PM   Kerry Kass [birth year] [id number]  Referring provider: [name], MD [address] [place],   [address]  Dysuria and incomplete emptying   HPI: Mr Arcia is a 66yo here for evaluation of dysuria and a feeling of incomplete emptying. PVR 330cc. He finished macrobid and then symptoms returned. UA today is concerning for infection.    PMH: Past Medical History:  Diagnosis Date   Allergic rhinitis    BPH with obstruction/lower urinary tract symptoms    flomax and finasteride qod per urol   Coronary artery disease    With preserved EF.  Needs DAPT lifetime due to high risk status.   COVID 09/01/2020   Elevated PSA onset @ 2009   Neg bx x 3.  MRI c/w bph only.  PSAs stable as of 11/2019. Dr. Demetrios Isaacs   GERD (gastroesophageal reflux disease)    Hearing loss of both ears    History of meniscal tear 2023   L knee   History of myocardial infarction 2011   Hyperlipidemia    Hypertension    Insomnia    Myocardial infarction [facility])    Organic impotence    Palpitations 05/2018   PVCs   Sciatica    Piriformis syndrome likely--improving with gabapentin as of 04/2017.  May need MRI L spine and/or emg/NCS   Squamous cell carcinoma     Surgical History: Past Surgical History:  Procedure Laterality Date   CARDIAC CATHETERIZATION  01/31/13   Stents to LAD patent; small diagonal stent with 90% ostial stenosis--good overall LV function with mild anterior and focal inferoapical hypokinesis, EF 60-65%.  Unsuccessful angioplasty of diagonal.   CARDIOVASCULAR STRESS TEST  05/26/15   stress echo neg for ischemia   CARDIOVASCULAR STRESS TEST  10/04/2017   Myoc perf imaging: normal (EF 59%)   COLONOSCOPY  2008; 06/14/16   2008 Normal.  2018 hyperplastic polyp +internal hemorrhoids.  Recall 2028.   CYSTOSCOPY WITH INSERTION OF UROLIFT N/A 10/04/2021   Procedure: CYSTOSCOPY WITH INSERTION OF UROLIFT;  Surgeon: Cleon Gustin, MD;   Location: [facility];  Service: Urology;  Laterality: N/A;   HYDROCELE EXCISION / REPAIR  early 2000's   INGUINAL HERNIA REPAIR Bilateral 1998   PROSTATE BIOPSY     Multiple; all neg.  Prostate MRI 2018 -NO SIGN OF PROSTATE CA.   PTCA     Cath 2011---4 stents--LAD and diagonal.  2014 repeat cath for CP showed no changes.   Scrotal u/s  01/2015   numerous extratesticular cystic lesions bilat, c/w numerous epididymal cysts vs loculated hydrocele--following expectantly.    Home Medications:  Allergies as of 01/17/2022       Reactions   Cephalexin Other (See Comments)   Cause the patient to develop Cdiff         Medication List        Accurate as of January 17, 2022  4:06 PM. If you have any questions, ask your nurse or doctor.          aspirin EC 81 MG tablet Take 81 mg by mouth daily.   atorvastatin 40 MG tablet Commonly known as: LIPITOR TAKE 1 TABLET DAILY   Avodart 0.5 MG capsule Generic drug: dutasteride TAKE 1 CAPSULE EVERY OTHER DAY   carvedilol 6.25 MG tablet Commonly known as: Coreg Take 1 tablet (6.25 mg total) by mouth 2 (two) times daily.   lisinopril 5 MG tablet Commonly known as: ZESTRIL Take 1 tablet (5  mg total) by mouth daily.   melatonin 5 MG Tabs Take 5 mg by mouth at bedtime.   MULTIVITAMIN ADULT PO Take 1 capsule by mouth daily.   nitrofurantoin (macrocrystal-monohydrate) 100 MG capsule Commonly known as: MACROBID Take 1 capsule (100 mg total) by mouth every 12 (twelve) hours.   nitroGLYCERIN 0.4 MG/SPRAY spray Commonly known as: NITROLINGUAL Place 1 spray under the tongue every 5 (five) minutes x 3 doses as needed for chest pain.   omeprazole 20 MG capsule Commonly known as: PRILOSEC Take 20 mg by mouth daily.   ticagrelor 60 MG Tabs tablet Commonly known as: Brilinta Take 1 tablet (60 mg total) by mouth 2 (two) times daily.        Allergies:  Allergies  Allergen Reactions   Cephalexin Other (See Comments)    Cause the  patient to develop Cdiff     Family History: Family History  Problem Relation Age of Onset   Hyperlipidemia Mother    Hypertension Mother    Heart disease Mother 65       CABG   Other Mother        Neuroendocrine carcinoma pancreas (solid mass) and liver (diffuse)   Heart disease Father 71       CABG   Hyperlipidemia Father    Hypertension Father    Colon polyps Father    Hypothyroidism Sister    Heart attack Brother 4       MI   Prostate cancer Paternal Uncle    Peripheral vascular disease Brother        Carotid    Stroke Neg Hx    Diabetes Neg Hx     Social History:  reports that he quit smoking about 43 years ago. His smoking use included cigarettes. He has a 0.50 pack-year smoking history. He has never used smokeless tobacco. He reports current alcohol use of about 14.0 standard drinks of alcohol per week. He reports that he does not use drugs.  ROS: All other review of systems were reviewed and are negative except what is noted above in HPI  Physical Exam: BP 120/77   Pulse 61   Ht '5\' 9"'$  (1.753 m)   Wt 190 lb (86.2 kg)   BMI 28.06 kg/m   Constitutional:  Alert and oriented, No acute distress. HEENT: [place] AT, moist mucus membranes.  Trachea midline, no masses. Cardiovascular: No clubbing, cyanosis, or edema. Respiratory: Normal respiratory effort, no increased work of breathing. GI: Abdomen is soft, nontender, nondistended, no abdominal masses GU: No CVA tenderness.  Lymph: No cervical or inguinal lymphadenopathy. Skin: No rashes, bruises or suspicious lesions. Neurologic: Grossly intact, no focal deficits, moving all 4 extremities. Psychiatric: Normal mood and affect.  Laboratory Data: Lab Results  Component Value Date   WBC 10.3 12/21/2021   HGB 15.3 12/21/2021   HCT 44.4 12/21/2021   MCV 89.9 12/21/2021   PLT 194 12/21/2021    Lab Results  Component Value Date   CREATININE 0.99 12/21/2021    Lab Results  Component Value Date   PSA 6.62 (H)  12/21/2021   PSA 4.4 (H) 11/25/2019   PSA 5.20 11/20/2019    Lab Results  Component Value Date   TESTOSTERONE 414 11/25/2019    Lab Results  Component Value Date   HGBA1C 5.6 12/21/2021    Urinalysis    Component Value Date/Time   APPEARANCEUR Cloudy (A) 01/05/2022 1043   GLUCOSEU Negative 01/05/2022 1043   BILIRUBINUR Negative 01/05/2022 1043   PROTEINUR  Negative 01/05/2022 1043   NITRITE Positive (A) 01/05/2022 1043   LEUKOCYTESUR 3+ (A) 01/05/2022 1043    Lab Results  Component Value Date   LABMICR See below: 01/05/2022   WBCUA >30 (A) 01/05/2022   LABEPIT None seen 01/05/2022   BACTERIA Many (A) 01/05/2022    Pertinent Imaging:  No results found for this or any previous visit.  No results found for this or any previous visit.  No results found for this or any previous visit.  No results found for this or any previous visit.  No results found for this or any previous visit.  No results found for this or any previous visit.  No results found for this or any previous visit.  No results found for this or any previous visit.   Assessment & Plan:    1. Urinary tract infection without hematuria, site unspecified -Bactrim DS BID for 28 days - Urinalysis, Routine w reflex microscopic   No follow-ups on file.  Nicolette Bang, MD  [facility] [place]

## 2022-01-17 NOTE — Progress Notes (Signed)
post void residual =340m

## 2022-01-18 ENCOUNTER — Telehealth: Payer: Self-pay

## 2022-01-18 MED ORDER — DOXYCYCLINE HYCLATE 100 MG PO CAPS: 100.0000 mg | ORAL_CAPSULE | Freq: Two times a day (BID) | ORAL | 0 refills | Status: DC

## 2022-01-18 NOTE — Telephone Encounter (Signed)
See below and please advised

## 2022-01-18 NOTE — Telephone Encounter (Signed)
Patient did not like the interactions with Bactrim and another medication he is taking.  He requested another antibiotic be sent in.  Verbal from Dr. Alyson Ingles to send in Doxycycline BID for 7 Days, rx sent and patient aware.

## 2022-01-19 LAB — URINALYSIS, ROUTINE W REFLEX MICROSCOPIC
Bilirubin, UA: NEGATIVE
Glucose, UA: NEGATIVE
Ketones, UA: NEGATIVE
Nitrite, UA: POSITIVE — AB
Protein,UA: NEGATIVE
Specific Gravity, UA: 1.015 (ref 1.005–1.030)
Urobilinogen, Ur: 0.2 mg/dL (ref 0.2–1.0)
pH, UA: 5.5 (ref 5.0–7.5)

## 2022-01-19 LAB — MICROSCOPIC EXAMINATION: WBC, UA: >30 /hpf — AB (ref 0–5)

## 2022-01-19 NOTE — Telephone Encounter (Signed)
Patient medication was changed to doxycycline.

## 2022-01-20 LAB — URINE CULTURE

## 2022-01-25 ENCOUNTER — Encounter: Payer: Self-pay | Admitting: Urology

## 2022-01-25 MED ORDER — SULFAMETHOXAZOLE-TRIMETHOPRIM 800-160 MG PO TABS
1.0000 | ORAL_TABLET | Freq: Two times a day (BID) | ORAL | 0 refills | Status: DC
Start: 2022-01-25 — End: 2022-06-23

## 2022-01-31 ENCOUNTER — Other Ambulatory Visit: Payer: Self-pay

## 2022-01-31 ENCOUNTER — Ambulatory Visit: Payer: 59 | Admitting: Physician Assistant

## 2022-01-31 VITALS — BP 113/73 | HR 56

## 2022-01-31 DIAGNOSIS — N390 Urinary tract infection, site not specified: Secondary | ICD-10-CM

## 2022-01-31 LAB — URINALYSIS, ROUTINE W REFLEX MICROSCOPIC
Bilirubin, UA: NEGATIVE
Glucose, UA: NEGATIVE
Ketones, UA: NEGATIVE
Leukocytes,UA: NEGATIVE
Nitrite, UA: NEGATIVE
Protein,UA: NEGATIVE
RBC, UA: NEGATIVE
Specific Gravity, UA: <1.005 — ABNORMAL LOW (ref 1.005–1.030)
Urobilinogen, Ur: 0.2 mg/dL (ref 0.2–1.0)
pH, UA: 5.5 (ref 5.0–7.5)

## 2022-01-31 LAB — BLADDER SCAN AMB NON-IMAGING: Scan Result: 257

## 2022-01-31 NOTE — Telephone Encounter (Signed)
Called patient with no answer and left voice message for patient to call our office back. Will try back later

## 2022-01-31 NOTE — Progress Notes (Unsigned)
Scan

## 2022-01-31 NOTE — Telephone Encounter (Signed)
-----   Message from Cleon Gustin, MD sent at 01/31/2022  4:40 PM EDT ----- Macrobid '100mg'$  BID for 7 days ----- Message ----- From: Sherrilyn Rist, CMA Sent: 01/20/2022   8:35 AM EDT To: Cleon Gustin, MD  Please Review. Patient have appt 08/28

## 2022-02-01 NOTE — Progress Notes (Signed)
Dr. Alyson Ingles aware of PVR. Will continue to follow. No new tx at this time

## 2022-02-03 ENCOUNTER — Telehealth: Payer: Self-pay

## 2022-02-03 MED ORDER — NITROFURANTOIN MONOHYD MACRO 100 MG PO CAPS: 100.0000 mg | ORAL_CAPSULE | Freq: Two times a day (BID) | ORAL | 0 refills | Status: DC

## 2022-02-03 NOTE — Telephone Encounter (Signed)
Can you please follow up with Dr. Alyson Ingles tomorrow?

## 2022-02-04 NOTE — Telephone Encounter (Signed)
Informed patient that per Dr. Alyson Ingles he did not need to take the Macrobid that was sent in on 08/31.  Patient states that he had misunderstood my last instructions when we last spoke to d/c the Bactrim and take the doxycycline sent in.  Patient states he completed the 14 tabs of doxy and had been taking the Bactrim for a week now.  Verbal from Dr. Alyson Ingles for patient to discontinue the Bactrim as long as he was doing better and his symptoms had resolved.  Patient states he is doing better and confirmed he will d/c the Bactrim rx.

## 2022-02-09 ENCOUNTER — Ambulatory Visit: Payer: 59 | Admitting: Urology

## 2022-02-23 ENCOUNTER — Encounter: Payer: Self-pay | Admitting: Cardiology

## 2022-02-24 MED ORDER — ATORVASTATIN CALCIUM 40 MG PO TABS: 40.0000 mg | ORAL_TABLET | Freq: Every day | ORAL | 2 refills | Status: DC

## 2022-02-24 MED ORDER — CARVEDILOL 6.25 MG PO TABS: 6.2500 mg | ORAL_TABLET | Freq: Two times a day (BID) | ORAL | 2 refills | Status: DC

## 2022-03-15 ENCOUNTER — Ambulatory Visit (INDEPENDENT_AMBULATORY_CARE_PROVIDER_SITE_OTHER): Payer: Medicare Other | Admitting: Urology

## 2022-03-15 ENCOUNTER — Encounter: Payer: Self-pay | Admitting: Urology

## 2022-03-15 VITALS — BP 114/74 | HR 54

## 2022-03-15 DIAGNOSIS — I25810 Atherosclerosis of coronary artery bypass graft(s) without angina pectoris: Secondary | ICD-10-CM | POA: Diagnosis not present

## 2022-03-15 DIAGNOSIS — N401 Enlarged prostate with lower urinary tract symptoms: Secondary | ICD-10-CM

## 2022-03-15 DIAGNOSIS — N390 Urinary tract infection, site not specified: Secondary | ICD-10-CM

## 2022-03-15 DIAGNOSIS — Z87440 Personal history of urinary (tract) infections: Secondary | ICD-10-CM | POA: Diagnosis not present

## 2022-03-15 DIAGNOSIS — R3912 Poor urinary stream: Secondary | ICD-10-CM

## 2022-03-15 LAB — URINALYSIS, ROUTINE W REFLEX MICROSCOPIC
Bilirubin, UA: NEGATIVE
Glucose, UA: NEGATIVE
Ketones, UA: NEGATIVE
Leukocytes,UA: NEGATIVE
Nitrite, UA: NEGATIVE
Protein,UA: NEGATIVE
RBC, UA: NEGATIVE
Specific Gravity, UA: 1.01 (ref 1.005–1.030)
Urobilinogen, Ur: 0.2 mg/dL (ref 0.2–1.0)
pH, UA: 7 (ref 5.0–7.5)

## 2022-03-15 NOTE — Progress Notes (Signed)
03/15/2022 9:42 AM   Ricky Chavez 1956/03/18 [id number]  Referring provider: [name], MD [address] [place],  [place] [address]  Followup BPH   HPI: Mr Raska is a 66yo here for followup for BPH. PVR 238 IPSS 9 QOL 1 after urolift. Urine stream strong. Nocturia 0x. No straining to urinate. Occasional dribble. He is currently on avodart   PMH: Past Medical History:  Diagnosis Date   Allergic rhinitis    BPH with obstruction/lower urinary tract symptoms    flomax and finasteride qod per urol   Coronary artery disease    With preserved EF.  Needs DAPT lifetime due to high risk status.   COVID 09/01/2020   Elevated PSA onset @ 2009   Neg bx x 3.  MRI c/w bph only.  PSAs stable as of 11/2019. Dr. Demetrios Isaacs   GERD (gastroesophageal reflux disease)    Hearing loss of both ears    History of meniscal tear 2023   L knee   History of myocardial infarction 2011   Hyperlipidemia    Hypertension    Insomnia    Myocardial infarction [facility])    Organic impotence    Palpitations 05/2018   PVCs   Sciatica    Piriformis syndrome likely--improving with gabapentin as of 04/2017.  May need MRI L spine and/or emg/NCS   Squamous cell carcinoma     Surgical History: Past Surgical History:  Procedure Laterality Date   CARDIAC CATHETERIZATION  01/31/13   Stents to LAD patent; small diagonal stent with 90% ostial stenosis--good overall LV function with mild anterior and focal inferoapical hypokinesis, EF 60-65%.  Unsuccessful angioplasty of diagonal.   CARDIOVASCULAR STRESS TEST  05/26/15   stress echo neg for ischemia   CARDIOVASCULAR STRESS TEST  10/04/2017   Myoc perf imaging: normal (EF 59%)   COLONOSCOPY  2008; 06/14/16   2008 Normal.  2018 hyperplastic polyp +internal hemorrhoids.  Recall 2028.   CYSTOSCOPY WITH INSERTION OF UROLIFT N/A 10/04/2021   Procedure: CYSTOSCOPY WITH INSERTION OF UROLIFT;  Surgeon: Cleon Gustin, MD;  Location: [facility];   Service: Urology;  Laterality: N/A;   HYDROCELE EXCISION / REPAIR  early 2000's   INGUINAL HERNIA REPAIR Bilateral 1998   PROSTATE BIOPSY     Multiple; all neg.  Prostate MRI 2018 -NO SIGN OF PROSTATE CA.   PTCA     Cath 2011---4 stents--LAD and diagonal.  2014 repeat cath for CP showed no changes.   Scrotal u/s  01/2015   numerous extratesticular cystic lesions bilat, c/w numerous epididymal cysts vs loculated hydrocele--following expectantly.    Home Medications:  Allergies as of 03/15/2022       Reactions   Cephalexin Other (See Comments)   Cause the patient to develop Cdiff         Medication List        Accurate as of March 15, 2022  9:42 AM. If you have any questions, ask your nurse or doctor.          aspirin EC 81 MG tablet Take 81 mg by mouth daily.   atorvastatin 40 MG tablet Commonly known as: LIPITOR Take 1 tablet (40 mg total) by mouth daily.   Avodart 0.5 MG capsule Generic drug: dutasteride TAKE 1 CAPSULE EVERY OTHER DAY   carvedilol 6.25 MG tablet Commonly known as: Coreg Take 1 tablet (6.25 mg total) by mouth 2 (two) times daily.   doxycycline 100 MG capsule Commonly known as:  VIBRAMYCIN Take 1 capsule (100 mg total) by mouth every 12 (twelve) hours.   lisinopril 5 MG tablet Commonly known as: ZESTRIL Take 1 tablet (5 mg total) by mouth daily.   melatonin 5 MG Tabs Take 5 mg by mouth at bedtime.   MULTIVITAMIN ADULT PO Take 1 capsule by mouth daily.   nitrofurantoin (macrocrystal-monohydrate) 100 MG capsule Commonly known as: MACROBID Take 1 capsule (100 mg total) by mouth every 12 (twelve) hours.   nitroGLYCERIN 0.4 MG/SPRAY spray Commonly known as: NITROLINGUAL Place 1 spray under the tongue every 5 (five) minutes x 3 doses as needed for chest pain.   omeprazole 20 MG capsule Commonly known as: PRILOSEC Take 20 mg by mouth daily.   sulfamethoxazole-trimethoprim 800-160 MG tablet Commonly known as: BACTRIM DS Take 1 tablet  by mouth every 12 (twelve) hours.   ticagrelor 60 MG Tabs tablet Commonly known as: Brilinta Take 1 tablet (60 mg total) by mouth 2 (two) times daily.        Allergies:  Allergies  Allergen Reactions   Cephalexin Other (See Comments)    Cause the patient to develop Cdiff     Family History: Family History  Problem Relation Age of Onset   Hyperlipidemia Mother    Hypertension Mother    Heart disease Mother 3       CABG   Other Mother        Neuroendocrine carcinoma pancreas (solid mass) and liver (diffuse)   Heart disease Father 38       CABG   Hyperlipidemia Father    Hypertension Father    Colon polyps Father    Hypothyroidism Sister    Heart attack Brother 10       MI   Prostate cancer Paternal Uncle    Peripheral vascular disease Brother        Carotid    Stroke Neg Hx    Diabetes Neg Hx     Social History:  reports that he quit smoking about 43 years ago. His smoking use included cigarettes. He has a 0.50 pack-year smoking history. He has never used smokeless tobacco. He reports current alcohol use of about 14.0 standard drinks of alcohol per week. He reports that he does not use drugs.  ROS: All other review of systems were reviewed and are negative except what is noted above in HPI  Physical Exam: BP 114/74   Pulse (!) 54   Constitutional:  Alert and oriented, No acute distress. HEENT:  AT, moist mucus membranes.  Trachea midline, no masses. Cardiovascular: No clubbing, cyanosis, or edema. Respiratory: Normal respiratory effort, no increased work of breathing. GI: Abdomen is soft, nontender, nondistended, no abdominal masses GU: No CVA tenderness.  Lymph: No cervical or inguinal lymphadenopathy. Skin: No rashes, bruises or suspicious lesions. Neurologic: Grossly intact, no focal deficits, moving all 4 extremities. Psychiatric: Normal mood and affect.  Laboratory Data: Lab Results  Component Value Date   WBC 10.3 12/21/2021   HGB 15.3 12/21/2021    HCT 44.4 12/21/2021   MCV 89.9 12/21/2021   PLT 194 12/21/2021    Lab Results  Component Value Date   CREATININE 0.99 12/21/2021    Lab Results  Component Value Date   PSA 6.62 (H) 12/21/2021   PSA 4.4 (H) 11/25/2019   PSA 5.20 11/20/2019    Lab Results  Component Value Date   TESTOSTERONE 414 11/25/2019    Lab Results  Component Value Date   HGBA1C 5.6 12/21/2021  Urinalysis    Component Value Date/Time   APPEARANCEUR Clear 01/31/2022 1156   GLUCOSEU Negative 01/31/2022 1156   BILIRUBINUR Negative 01/31/2022 1156   PROTEINUR Negative 01/31/2022 1156   NITRITE Negative 01/31/2022 1156   LEUKOCYTESUR Negative 01/31/2022 1156    Lab Results  Component Value Date   LABMICR Comment 01/31/2022   WBCUA >30 (A) 01/17/2022   LABEPIT 0-10 01/17/2022   BACTERIA Many (A) 01/17/2022    Pertinent Imaging:  No results found for this or any previous visit.  No results found for this or any previous visit.  No results found for this or any previous visit.  No results found for this or any previous visit.  No results found for this or any previous visit.  No valid procedures specified. No results found for this or any previous visit.  No results found for this or any previous visit.   Assessment & Plan:    1. Urinary tract infection without hematuria, site unspecified -resolved - Urinalysis, Routine w reflex microscopic - BLADDER SCAN AMB NON-IMAGING  2. Benign localized prostatic hyperplasia with lower urinary tract symptoms (LUTS) -Patient to stop avodart in 1 month - Urinalysis, Routine w reflex microscopic - BLADDER SCAN AMB NON-IMAGING  3. Weak urinary stream -improved with urolift   No follow-ups on file.  Nicolette Bang, MD  [facility] [place]

## 2022-03-15 NOTE — Patient Instructions (Signed)

## 2022-03-15 NOTE — Progress Notes (Signed)
post void residual=238

## 2022-05-17 ENCOUNTER — Encounter: Payer: Self-pay | Admitting: Cardiology

## 2022-05-17 MED ORDER — LISINOPRIL 5 MG PO TABS: 5.0000 mg | ORAL_TABLET | Freq: Every day | ORAL | 1 refills | Status: DC

## 2022-05-17 MED ORDER — TICAGRELOR 60 MG PO TABS: 60.0000 mg | ORAL_TABLET | Freq: Two times a day (BID) | ORAL | 1 refills | Status: DC

## 2022-06-23 ENCOUNTER — Encounter: Payer: Self-pay | Admitting: Family Medicine

## 2022-06-23 ENCOUNTER — Ambulatory Visit (INDEPENDENT_AMBULATORY_CARE_PROVIDER_SITE_OTHER): Payer: Medicare Other | Admitting: Family Medicine

## 2022-06-23 VITALS — BP 100/62 | HR 53 | Temp 97.6°F | Ht 69.0 in | Wt 194.2 lb

## 2022-06-23 DIAGNOSIS — S90424A Blister (nonthermal), right lesser toe(s), initial encounter: Secondary | ICD-10-CM

## 2022-06-23 DIAGNOSIS — E7800 Pure hypercholesterolemia, unspecified: Secondary | ICD-10-CM | POA: Diagnosis not present

## 2022-06-23 DIAGNOSIS — E663 Overweight: Secondary | ICD-10-CM

## 2022-06-23 DIAGNOSIS — I10 Essential (primary) hypertension: Secondary | ICD-10-CM | POA: Diagnosis not present

## 2022-06-23 NOTE — Progress Notes (Signed)
OFFICE VISIT  06/23/2022  CC:  Chief Complaint  Patient presents with   Medical Management of Chronic Issues    Pt is not fasting    Patient is a 67 y.o. male with coronary artery disease on dual antiplatelet therapy indefinitely who presents for 79-monthfollow-up hypertension, hyperlipidemia, and IFG. A/P as of last visit: "#1 hypertension, well controlled on lisinopril 5 mg a day and Coreg 6.25 mg twice daily. Electrolytes and creatinine today.  2.  Hyperlipidemia, doing well on atorvastatin 40 mg a day Lipid panel and hepatic panel today.  3.  Impaired fasting glucose.  He has had a history of very mild elevated fasting glucose.  A1c max has been a 5.7%. Checking fasting glucose and hemoglobin A1c today."  INTERIM HX: Feeling well. Hunting season went well, got 7 deer.  When he was hunting 6 weeks ago he used a warmers and got a very painful blister on the tip of one of his toes on the right foot. He drained the blister and it filled up again a couple times.  Then got a small hematoma/scab over it and this is not resolved yet.  He no longer has any pain in the toe.    Past Medical History:  Diagnosis Date   Allergic rhinitis    BPH with obstruction/lower urinary tract symptoms    flomax and finasteride qod per urol   Coronary artery disease    With preserved EF.  Needs DAPT lifetime due to high risk status.   COVID 09/01/2020   Elevated PSA onset @ 2009   Neg bx x 3.  MRI c/w bph only.  PSAs stable as of 11/2019. Dr. MDemetrios Isaacs  GERD (gastroesophageal reflux disease)    Hearing loss of both ears    History of meniscal tear 2023   L knee   History of myocardial infarction 2011   Hyperlipidemia    Hypertension    Insomnia    Myocardial infarction ([facility]    Organic impotence    Palpitations 05/2018   PVCs   Sciatica    Piriformis syndrome likely--improving with gabapentin as of 04/2017.  May need MRI L spine and/or emg/NCS   Squamous cell carcinoma      Past Surgical History:  Procedure Laterality Date   CARDIAC CATHETERIZATION  01/31/13   Stents to LAD patent; small diagonal stent with 90% ostial stenosis--good overall LV function with mild anterior and focal inferoapical hypokinesis, EF 60-65%.  Unsuccessful angioplasty of diagonal.   CARDIOVASCULAR STRESS TEST  05/26/15   stress echo neg for ischemia   CARDIOVASCULAR STRESS TEST  10/04/2017   Myoc perf imaging: normal (EF 59%)   COLONOSCOPY  2008; 06/14/16   2008 Normal.  2018 hyperplastic polyp +internal hemorrhoids.  Recall 2028.   CYSTOSCOPY WITH INSERTION OF UROLIFT N/A 10/04/2021   Procedure: CYSTOSCOPY WITH INSERTION OF UROLIFT;  Surgeon: MCleon Gustin MD;  Location: [facility];  Service: Urology;  Laterality: N/A;   HYDROCELE EXCISION / REPAIR  early 2000's   INGUINAL HERNIA REPAIR Bilateral 1998   PROSTATE BIOPSY     Multiple; all neg.  Prostate MRI 2018 -NO SIGN OF PROSTATE CA.   PTCA     Cath 2011---4 stents--LAD and diagonal.  2014 repeat cath for CP showed no changes.   Scrotal u/s  01/2015   numerous extratesticular cystic lesions bilat, c/w numerous epididymal cysts vs loculated hydrocele--following expectantly.    Outpatient Medications Prior to Visit  Medication Sig Dispense Refill  aspirin EC 81 MG tablet Take 81 mg by mouth daily.     atorvastatin (LIPITOR) 40 MG tablet Take 1 tablet (40 mg total) by mouth daily. 90 tablet 2   AVODART 0.5 MG capsule TAKE 1 CAPSULE EVERY OTHER DAY 30 capsule 3   carvedilol (COREG) 6.25 MG tablet Take 1 tablet (6.25 mg total) by mouth 2 (two) times daily. 180 tablet 2   lisinopril (ZESTRIL) 5 MG tablet Take 1 tablet (5 mg total) by mouth daily. 90 tablet 1   melatonin 5 MG TABS Take 5 mg by mouth at bedtime.     Multiple Vitamins-Minerals (MULTIVITAMIN ADULT PO) Take 1 capsule by mouth daily.     nitroGLYCERIN (NITROLINGUAL) 0.4 MG/SPRAY spray Place 1 spray under the tongue every 5 (five) minutes x 3 doses as needed for chest  pain. 12 g 1   omeprazole (PRILOSEC) 20 MG capsule Take 20 mg by mouth daily.     ticagrelor (BRILINTA) 60 MG TABS tablet Take 1 tablet (60 mg total) by mouth 2 (two) times daily. 180 tablet 1   doxycycline (VIBRAMYCIN) 100 MG capsule Take 1 capsule (100 mg total) by mouth every 12 (twelve) hours. (Patient not taking: Reported on 06/23/2022) 14 capsule 0   nitrofurantoin, macrocrystal-monohydrate, (MACROBID) 100 MG capsule Take 1 capsule (100 mg total) by mouth every 12 (twelve) hours. (Patient not taking: Reported on 03/15/2022) 14 capsule 0   sulfamethoxazole-trimethoprim (BACTRIM DS) 800-160 MG tablet Take 1 tablet by mouth every 12 (twelve) hours. (Patient not taking: Reported on 06/23/2022) 56 tablet 0   No facility-administered medications prior to visit.    Allergies  Allergen Reactions   Cephalexin Other (See Comments)    Cause the patient to develop Cdiff     Review of Systems As per HPI  PE:    06/23/2022    9:02 AM 03/15/2022    9:28 AM 01/31/2022   11:47 AM  Vitals with BMI  Height '5\' 9"'$     Weight 194 lbs 3 oz    BMI 75.64    Systolic 332 951 884  Diastolic 62 74 73  Pulse 53 54 56     Physical Exam  Gen: Alert, well appearing.  Patient is oriented to person, place, time, and situation. AFFECT: pleasant, lucid thought and speech. CV: RRR, no m/r/g.   LUNGS: CTA bilat, nonlabored resps, good aeration in all lung fields. EXT: no clubbing or cyanosis.  no edema.  Right second toe with scab on the tip, small amount of pink epidermal tissue in the periphery.  No opening to the subcutaneous tissue.  No sign of recent drainage.  No tenderness.  No swelling.  LABS:  Last CBC Lab Results  Component Value Date   WBC 10.3 12/21/2021   HGB 15.3 12/21/2021   HCT 44.4 12/21/2021   MCV 89.9 12/21/2021   MCH 31.0 12/21/2021   RDW 12.8 12/21/2021   PLT 194 16/60/6301   Last metabolic panel Lab Results  Component Value Date   GLUCOSE 104 (H) 12/21/2021   NA 140  12/21/2021   K 4.8 12/21/2021   CL 105 12/21/2021   CO2 25 12/21/2021   BUN 14 12/21/2021   CREATININE 0.99 12/21/2021   CALCIUM 9.4 12/21/2021   PROT 7.0 12/21/2021   ALBUMIN 3.9 09/01/2016   BILITOT 0.8 12/21/2021   ALKPHOS 72 09/01/2016   AST 24 12/21/2021   ALT 32 12/21/2021   Last lipids Lab Results  Component Value Date   CHOL 141  12/21/2021   HDL 46 12/21/2021   LDLCALC 73 12/21/2021   TRIG 140 12/21/2021   CHOLHDL 3.1 12/21/2021   Last hemoglobin A1c Lab Results  Component Value Date   HGBA1C 5.6 12/21/2021   Last thyroid functions Lab Results  Component Value Date   TSH 1.25 12/21/2021   Lab Results  Component Value Date   PSA1 6.3 (H) 07/16/2021   PSA1 8.7 (H) 01/26/2021   PSA1 5.2 (H) 07/13/2020   PSA 6.62 (H) 12/21/2021   PSA 4.4 (H) 11/25/2019   PSA 5.20 11/20/2019   IMPRESSION AND PLAN:  #1 hypertension, well-controlled on lisinopril 5 mg a day and Coreg 6.25 mg twice daily. He has had long-term stability of electrolytes and creatinine so we deferred lab work today.  2.  Hyperlipidemia, doing well on atorvastatin 40 mg a day. LDL was 73 about 6 months ago.  He is not fasting today. Plan repeat lipids in hepatic panel in 6 months.  #3 overweight, BMI 28. He would like to try GLP-1 receptor agonist for weight loss but wants to still do some research regarding long-term safety.  We discussed this today.  We also discussed the lack of insurance coverage for these medications unless a person carries a diagnosis of diabetes. He will research prices.  Will call back and let me know if he wants to pursue 1 of these medications.  #4 right toe blister. Is taking a while to heal and does have significant amount of subcutaneous old blood.  Expect this to gradually resolved.  No sign of infection.  An After Visit Summary was printed and given to the patient.  FOLLOW UP: Return in about 6 months (around 12/22/2022) for annual CPE (fasting).  Signed:  Crissie Sickles, MD           06/23/2022

## 2022-09-21 ENCOUNTER — Ambulatory Visit (INDEPENDENT_AMBULATORY_CARE_PROVIDER_SITE_OTHER): Payer: Medicare Other | Admitting: Urology

## 2022-09-21 ENCOUNTER — Encounter: Payer: Self-pay | Admitting: Urology

## 2022-09-21 VITALS — BP 107/67 | HR 49 | Ht 69.0 in | Wt 165.0 lb

## 2022-09-21 DIAGNOSIS — R3912 Poor urinary stream: Secondary | ICD-10-CM | POA: Diagnosis not present

## 2022-09-21 DIAGNOSIS — N401 Enlarged prostate with lower urinary tract symptoms: Secondary | ICD-10-CM

## 2022-09-21 DIAGNOSIS — R972 Elevated prostate specific antigen [PSA]: Secondary | ICD-10-CM | POA: Diagnosis not present

## 2022-09-21 LAB — URINALYSIS, ROUTINE W REFLEX MICROSCOPIC
Bilirubin, UA: NEGATIVE
Glucose, UA: NEGATIVE
Ketones, UA: NEGATIVE
Leukocytes,UA: NEGATIVE
Nitrite, UA: NEGATIVE
Protein,UA: NEGATIVE
RBC, UA: NEGATIVE
Specific Gravity, UA: 1.01 (ref 1.005–1.030)
Urobilinogen, Ur: 0.2 mg/dL (ref 0.2–1.0)
pH, UA: 5.5 (ref 5.0–7.5)

## 2022-09-21 LAB — BLADDER SCAN AMB NON-IMAGING: Scan Result: 219

## 2022-09-21 NOTE — Patient Instructions (Signed)

## 2022-09-21 NOTE — Progress Notes (Signed)
post void residual =219 ml 

## 2022-09-21 NOTE — Progress Notes (Signed)
09/21/2022 10:50 AM   [name] [birth year] [id number]  Referring provider: Jeoffrey Massed, MD [address] [place],  [place] [address]  Followup elevated PSA and BPH   HPI: [name] is a 67yo here for followup for BPH and elevated PSA. No recent PSA. IPSS 9 QOL 2 after urolift. Nocturia 0x. Stream is intermittently weak. He is not bothered by his LUTS. No straining to urinate. No other complaints today   PMH: Past Medical History:  Diagnosis Date   Allergic rhinitis    BPH with obstruction/lower urinary tract symptoms    flomax and finasteride qod per urol   Coronary artery disease    With preserved EF.  Needs DAPT lifetime due to high risk status.   COVID 09/01/2020   Elevated PSA onset @ 2009   Neg bx x 3.  MRI c/w bph only.  PSAs stable as of 11/2019. Dr. Lenon Curt   GERD (gastroesophageal reflux disease)    Hearing loss of both ears    History of meniscal tear 2023   L knee   History of myocardial infarction 2011   Hyperlipidemia    Hypertension    Insomnia    Myocardial infarction [facility])    Organic impotence    Palpitations 05/2018   PVCs   Sciatica    Piriformis syndrome likely--improving with gabapentin as of 04/2017.  May need MRI L spine and/or emg/NCS   Squamous cell carcinoma     Surgical History: Past Surgical History:  Procedure Laterality Date   CARDIAC CATHETERIZATION  01/31/13   Stents to LAD patent; small diagonal stent with 90% ostial stenosis--good overall LV function with mild anterior and focal inferoapical hypokinesis, EF 60-65%.  Unsuccessful angioplasty of diagonal.   CARDIOVASCULAR STRESS TEST  05/26/15   stress echo neg for ischemia   CARDIOVASCULAR STRESS TEST  10/04/2017   Myoc perf imaging: normal (EF 59%)   COLONOSCOPY  2008; 06/14/16   2008 Normal.  2018 hyperplastic polyp +internal hemorrhoids.  Recall 2028.   CYSTOSCOPY WITH INSERTION OF UROLIFT N/A 10/04/2021   Procedure: CYSTOSCOPY WITH INSERTION OF UROLIFT;   Surgeon: Malen Gauze, MD;  Location: [facility];  Service: Urology;  Laterality: N/A;   HYDROCELE EXCISION / REPAIR  early 2000's   INGUINAL HERNIA REPAIR Bilateral 1998   PROSTATE BIOPSY     Multiple; all neg.  Prostate MRI 2018 -NO SIGN OF PROSTATE CA.   PTCA     Cath 2011---4 stents--LAD and diagonal.  2014 repeat cath for CP showed no changes.   Scrotal u/s  01/2015   numerous extratesticular cystic lesions bilat, c/w numerous epididymal cysts vs loculated hydrocele--following expectantly.    Home Medications:  Allergies as of 09/21/2022       Reactions   Cephalexin Other (See Comments)   Cause the patient to develop Cdiff         Medication List        Accurate as of September 21, 2022 10:50 AM. If you have any questions, ask your nurse or doctor.          aspirin EC 81 MG tablet Take 81 mg by mouth daily.   atorvastatin 40 MG tablet Commonly known as: LIPITOR Take 1 tablet (40 mg total) by mouth daily.   Avodart 0.5 MG capsule Generic drug: dutasteride TAKE 1 CAPSULE EVERY OTHER DAY   carvedilol 6.25 MG tablet Commonly known as: Coreg Take 1 tablet (6.25 mg total) by mouth 2 (two)  times daily.   lisinopril 5 MG tablet Commonly known as: ZESTRIL Take 1 tablet (5 mg total) by mouth daily.   melatonin 5 MG Tabs Take 5 mg by mouth at bedtime.   MULTIVITAMIN ADULT PO Take 1 capsule by mouth daily.   nitroGLYCERIN 0.4 MG/SPRAY spray Commonly known as: NITROLINGUAL Place 1 spray under the tongue every 5 (five) minutes x 3 doses as needed for chest pain.   omeprazole 20 MG capsule Commonly known as: PRILOSEC Take 20 mg by mouth daily.   ticagrelor 60 MG Tabs tablet Commonly known as: Brilinta Take 1 tablet (60 mg total) by mouth 2 (two) times daily.        Allergies:  Allergies  Allergen Reactions   Cephalexin Other (See Comments)    Cause the patient to develop Cdiff     Family History: Family History  Problem Relation Age of Onset    Hyperlipidemia Mother    Hypertension Mother    Heart disease Mother 36       CABG   Other Mother        Neuroendocrine carcinoma pancreas (solid mass) and liver (diffuse)   Heart disease Father 54       CABG   Hyperlipidemia Father    Hypertension Father    Colon polyps Father    Hypothyroidism Sister    Heart attack Brother 28       MI   Prostate cancer Paternal Uncle    Peripheral vascular disease Brother        Carotid    Stroke Neg Hx    Diabetes Neg Hx     Social History:  reports that he quit smoking about 44 years ago. His smoking use included cigarettes. He has a 0.50 pack-year smoking history. He has never used smokeless tobacco. He reports current alcohol use of about 14.0 standard drinks of alcohol per week. He reports that he does not use drugs.  ROS: All other review of systems were reviewed and are negative except what is noted above in HPI  Physical Exam: BP 107/67   Pulse (!) 49   Ht 5\' 9"  (1.753 m)   Wt 165 lb (74.8 kg)   BMI 24.37 kg/m   Constitutional:  Alert and oriented, No acute distress. HEENT: [place] AT, moist mucus membranes.  Trachea midline, no masses. Cardiovascular: No clubbing, cyanosis, or edema. Respiratory: Normal respiratory effort, no increased work of breathing. GI: Abdomen is soft, nontender, nondistended, no abdominal masses GU: No CVA tenderness.  Lymph: No cervical or inguinal lymphadenopathy. Skin: No rashes, bruises or suspicious lesions. Neurologic: Grossly intact, no focal deficits, moving all 4 extremities. Psychiatric: Normal mood and affect.  Laboratory Data: Lab Results  Component Value Date   WBC 10.3 12/21/2021   HGB 15.3 12/21/2021   HCT 44.4 12/21/2021   MCV 89.9 12/21/2021   PLT 194 12/21/2021    Lab Results  Component Value Date   CREATININE 0.99 12/21/2021    Lab Results  Component Value Date   PSA 6.62 (H) 12/21/2021   PSA 4.4 (H) 11/25/2019   PSA 5.20 11/20/2019    Lab Results  Component Value  Date   TESTOSTERONE 414 11/25/2019    Lab Results  Component Value Date   HGBA1C 5.6 12/21/2021    Urinalysis    Component Value Date/Time   APPEARANCEUR Clear 03/15/2022 0930   GLUCOSEU Negative 03/15/2022 0930   BILIRUBINUR Negative 03/15/2022 0930   PROTEINUR Negative 03/15/2022 0930   NITRITE Negative  03/15/2022 0930   LEUKOCYTESUR Negative 03/15/2022 0930    Lab Results  Component Value Date   LABMICR Comment 03/15/2022   WBCUA >30 (A) 01/17/2022   LABEPIT 0-10 01/17/2022   BACTERIA Many (A) 01/17/2022    Pertinent Imaging:  No results found for this or any previous visit.  No results found for this or any previous visit.  No results found for this or any previous visit.  No results found for this or any previous visit.  No results found for this or any previous visit.  No valid procedures specified. No results found for this or any previous visit.  No results found for this or any previous visit.   Assessment & Plan:    1. Benign localized prostatic hyperplasia with lower urinary tract symptoms (LUTS) -improved after urolift - Urinalysis, Routine w reflex microscopic - BLADDER SCAN AMB NON-IMAGING  2. Weak urinary stream -improved after Urolift  3. Elevated PSA PSA today, If stable followup 6 months with a PSA   No follow-ups on file.  Wilkie Aye, MD  [facility] [place]

## 2022-09-22 LAB — PSA: Prostate Specific Ag, Serum: 13.1 ng/mL — ABNORMAL HIGH (ref 0.0–4.0)

## 2022-09-25 ENCOUNTER — Encounter: Payer: Self-pay | Admitting: Urology

## 2022-09-25 ENCOUNTER — Encounter: Payer: Self-pay | Admitting: Cardiology

## 2022-09-26 MED ORDER — LISINOPRIL 5 MG PO TABS: 5.0000 mg | ORAL_TABLET | Freq: Every day | ORAL | 0 refills | Status: DC

## 2022-09-26 MED ORDER — ATORVASTATIN CALCIUM 40 MG PO TABS: 40.0000 mg | ORAL_TABLET | Freq: Every day | ORAL | 0 refills | Status: DC

## 2022-09-26 MED ORDER — CARVEDILOL 6.25 MG PO TABS: 6.2500 mg | ORAL_TABLET | Freq: Two times a day (BID) | ORAL | 0 refills | Status: DC

## 2022-09-29 ENCOUNTER — Telehealth: Payer: Self-pay

## 2022-09-29 NOTE — Telephone Encounter (Signed)
Patient called with no answer. Message left to return call to office. Patient also made aware that a My Chart message will be sent.

## 2022-09-29 NOTE — Telephone Encounter (Signed)
-----   Message from Malen Gauze, MD sent at 09/27/2022  9:37 AM EDT ----- He needs a prostate biopsy ----- Message ----- From: Gustavus Messing, LPN Sent: 1/61/0960   4:12 PM EDT To: Malen Gauze, MD  Please review

## 2022-10-06 NOTE — Telephone Encounter (Signed)
Consultation for prostate biopsy scheduled.

## 2022-10-06 NOTE — Telephone Encounter (Signed)
-----   Message from Patrick L McKenzie, MD sent at 09/27/2022  9:37 AM EDT ----- He needs a prostate biopsy ----- Message ----- From: Conchita Truxillo, LPN Sent: 09/22/2022   4:12 PM EDT To: Patrick L McKenzie, MD  Please review  

## 2022-11-12 ENCOUNTER — Encounter: Payer: Self-pay | Admitting: Cardiology

## 2022-11-16 ENCOUNTER — Ambulatory Visit (INDEPENDENT_AMBULATORY_CARE_PROVIDER_SITE_OTHER): Payer: Medicare Other | Admitting: Urology

## 2022-11-16 ENCOUNTER — Telehealth: Payer: Self-pay | Admitting: *Deleted

## 2022-11-16 VITALS — BP 132/78 | HR 54 | Ht 69.0 in | Wt 160.0 lb

## 2022-11-16 DIAGNOSIS — R972 Elevated prostate specific antigen [PSA]: Secondary | ICD-10-CM | POA: Diagnosis not present

## 2022-11-16 MED ORDER — DIAZEPAM 10 MG PO TABS: 10.0000 mg | ORAL_TABLET | Freq: Once | ORAL | 0 refills | Status: AC

## 2022-11-16 MED ORDER — LEVOFLOXACIN 750 MG PO TABS
750.0000 mg | ORAL_TABLET | Freq: Once | ORAL | 0 refills | Status: AC
Start: 2022-11-16 — End: 2022-11-16

## 2022-11-16 NOTE — Telephone Encounter (Signed)
.  Primary Cardiologist:James Hochrein, MD  Chart reviewed as part of pre-operative protocol coverage. Because of Ricky Chavez's past medical history and time since last visit, he/she will require a follow-up visit in order to better assess preoperative cardiovascular risk.  Pre-op covering staff: - Please schedule appointment and call patient to inform them. - Please contact requesting surgeon's office via preferred method (i.e, phone, fax) to inform them of need for appointment prior to surgery.  Previously advised he could hold Brilinta for 5 days for cystoscopy 09/2021. Pending no concerning cardiac symptoms, same hold may be applied to be determined by APP at the time of the virtual visit.   Levi Aland, NP-C  11/16/2022, 3:18 PM [address] Office [phone] Fax [phone]

## 2022-11-16 NOTE — Telephone Encounter (Signed)
   Pre-operative Risk Assessment    Patient Name: Ricky Chavez  DOB: [birth year] MRN: [id number]      Request for Surgical Clearance    Procedure:   PROSTATE BIOPSY  Date of Surgery:  Clearance 12/21/22                                 Surgeon:  DR. Ronne Binning Surgeon's Group or Practice Name:  [facility] Phone number:  [phone]  Fax number:  [phone]   Type of Clearance Requested:   - Medical  - Pharmacy:  Hold Ticagrelor (Brilinta) X'S 3 DAYS   Type of Anesthesia:  Local    Additional requests/questions:    Wilhemina Cash   11/16/2022, 2:50 PM

## 2022-11-16 NOTE — Telephone Encounter (Signed)
Left message to call back to schedule an in office appt for pre op clearance.

## 2022-11-16 NOTE — Patient Instructions (Addendum)
   Appointment ZOXW:[id number] Appointment Date:12/12/2022  Location: [facility]   Prostate Biopsy Instructions  Stop all aspirin or blood thinners (aspirin, plavix, coumadin, warfarin, motrin, ibuprofen, advil, aleve, naproxen, naprosyn) for 7 days prior to the procedure.  If you have any questions about stopping these medications, please contact your primary care physician or cardiologist.  Having a light meal prior to the procedure is recommended.  If you are diabetic or have low blood sugar please bring a small snack or glucose tablet.  A Fleets enema is needed to be purchased over the counter at a local pharmacy and used 2 hours before you scheduled appointment.  This can be purchased over the counter at any pharmacy.  Antibiotics will be administered in the clinic at the time of the procedure and 1 tablet has been sent to your pharmacy. Please take the antibiotic as prescribed.    Please bring someone with you to the procedure to drive you home if you are given a valium to take prior to your procedure.   If you have any questions or concerns, please feel free to call the office at [phone] or send a Mychart message.    Thank you, [facility]

## 2022-11-17 NOTE — Telephone Encounter (Signed)
Patient pre-op clearance visit scheduled on 6/19.

## 2022-11-17 NOTE — Telephone Encounter (Signed)
2nd attempt to reach the pt to schedule an appt for preop clearance. Pt can keep his appt 02/2023 with Dr. Antoine Poche but will need in office with APP for pre op clearance.

## 2022-11-17 NOTE — Telephone Encounter (Signed)
2nd attempt to reach pt regarding surgical clearance and the need for an IN OFFICE appointment.  Pt is scheduled to see Dr. Antoine Poche in September but pt needs to see someone sooner in order to be cleared for upcoming surgery.  Left pt a message to call back to schedule.

## 2022-11-17 NOTE — Telephone Encounter (Signed)
Will update the requesting office pt has appt 11/23/22.

## 2022-11-18 LAB — PSA: Prostate Specific Ag, Serum: 13.8 ng/mL — ABNORMAL HIGH (ref 0.0–4.0)

## 2022-11-19 ENCOUNTER — Other Ambulatory Visit: Payer: Self-pay | Admitting: Cardiology

## 2022-11-22 ENCOUNTER — Encounter: Payer: Self-pay | Admitting: Urology

## 2022-11-22 NOTE — Progress Notes (Unsigned)
Cardiology Clinic Note   Date: 11/23/2022 ID: ZETH SODERGREN, DOB 01/17/1956, MRN [id number]  Primary Cardiologist:  Rollene Rotunda, MD  Patient Profile    Ricky Chavez is a 67 y.o. male who presents to the clinic today for routine follow-up and preoperative risk assessment.    Past medical history significant for: CAD. LHC 12/23/2009 (anterior MI) performed in [place], [place]: LM disease.  Mild proximal LAD disease 70 to 80% mid LAD with visible thrombus.  60% ostial diagonal branch. PCI with overlapping stent x 2 to LAD. The second stent compromised the D1 branch to 80-90%. A stent was placed however, it moved during placement and protruded into LAD. An additional stent was attempted to be placed across the stent but there was difficulty advancing the additional stent. Given high radiation dose and dye load decision was made to stop procedure with plan to optimize the proximal LAD and D1 stent in 2 days.  LHC 12/23/2009 (recatheterization): 3 hours after primary procedure patient developed severe left-sided chest pain.  No significant EKG changes.  Angiography showed widely patent LAD and D1 stents.  Several attempts made to stretch diagonal stent to shrink some of the length but could not cross with balloon.  A balloon was placed across protruding stent in the LAD and crushed the D1 stent.  Patient developed severe chest pain and angiography showed total occlusion of the LAD.  Thrombotic material was found at the end of dilatation balloon.  Thrombus aspiration and multiple dilations were performed.  Flow was restored to the LAD but there was a lot of thrombotic material.  A second attempt to aspirate failed.  Additional stent to the proximal LAD was placed overlapping the prior stent.  Patient's pain immediately decreased.  The end result showed no evidence of compromise of flow in the LAD or diagonal branch. LHC 01/31/2013 (atypical chest pain) performed at [facility]: Multiple stents in  proximal, mid and distal LAD are patent.  Small stent in D1 90% ostial stenosis.  There is an appearance of a "crushed" bifurcation stenting technique.  The diagonal could be accessed by guidewire but a balloon could not be advanced from the LAD into the diagonal.  The tip of the balloon catheter would not engage the did not overlap of stents which is probably right at the crash where the LAD stent crushed the protrusion of the stent coming out of the diagonal.  Recommend continuing medical therapy. Nuclear stress test 10/04/2017: LVEF 55 to 65%.  No ST segment deviation noted during stress.  Normal, low risk study. Hypertension. Hyperlipidemia. Lipid panel 12/21/2021: LDL 73, HDL 46, TG 140, total 141. Palpitations.     History of Present Illness    Ricky Chavez is a longtime patient of cardiology.  He underwent complicated PCI following an anterior MI in [place], [place] in July 2011 in which he received multiple stents to LAD (details above).  Repeat catheterization performed at [facility] in August 2014 showed patent LAD stents and 90% ostial stenosis of small stent D1 (detailed above).  Patient was first to by Dr. Antoine Poche on 04/01/2016 for CAD at the request of Dr. Marvel Plan.  He had recently moved to the area.  Patient was last seen in the office by Dr. Antoine Poche on 09/24/2021 for routine follow-up.  He was pending cystoscopy at that time and was given the go ahead to hold Brilinta for 5 days prior while continuing aspirin.  Today, patient is here alone. He is doing well.  Patient denies shortness of breath or dyspnea on exertion. No chest pain, pressure, or tightness. Denies lower extremity edema, orthopnea, or PND. No palpitations. He is chronically bradycardic. He denies numbness, dizziness, presyncope, syncope.  Since January 2024 he and his wife joined the diet app [employer] and he has lost 25 lb. 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 He is pending a  prostate biopsy.       ROS: All other systems reviewed and are otherwise negative except as noted in History of Present Illness.  Studies Reviewed    EKG Interpretation  Date/Time:  Wednesday November 23 2022 11:30:40 EDT Ventricular Rate:  49 PR Interval:  142 QRS Duration: 80 QT Interval:  432 QTC Calculation: 390 R Axis:   46 Text Interpretation: Sinus bradycardia No previous ECGs available Confirmed by Carlos Levering [phone]) on 11/23/2022 11:37:15 AM  No significant changes from 09/24/2021.           Physical Exam    VS:  BP 118/76   Pulse (!) 49   Ht 5\' 8"  (1.727 m)   Wt 161 lb (73 kg)   SpO2 95%   BMI 24.48 kg/m  , BMI Body mass index is 24.48 kg/m.  GEN: Well nourished, well developed, in no acute distress. Neck: No JVD or carotid bruits. Cardiac: RRR. No murmurs. No rubs or gallops.   Respiratory:  Respirations regular and unlabored. Clear to auscultation without rales, wheezing or rhonchi. GI: Soft, nontender, nondistended. Extremities: Radials/DP/PT 2+ and equal bilaterally. No clubbing or cyanosis. No edema.  Skin: Warm and dry, no rash. Neuro: Strength intact.  Assessment & Plan    CAD.  S/p complicated PCI following anterior MI in [place], [place] in July 2011 in which she received multiple stents to the LAD (details above in patient profile).  Repeat catheterization performed at [facility] in August 2014 showed patent LAD stents and 90% stenosis of small D1 stent treated medically.  Nuclear stress test May 2019 was a normal low risk study.  Patient denies chest pain, tightness, or pressure.  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  He has lost 25 pounds since January 2024.  Continue aspirin, Brilinta, carvedilol, atorvastatin, as needed SL NTG. Hypertension: BP today 118/76.  Patient denies headaches, dizziness or vision changes. Continue carvedilol, lisinopril. Hyperlipidemia.  LDL July 2023 73.  Continue atorvastatin. Followed by PCP.  Bradycardia.  Patient reports he has been bradycardic since his heart catheterization.  Heart rate today 49 bpm.  He denies lightheadedness, dizziness, presyncope, syncope.  His mother had a history of PPM.  He will spot check his heart rate on BP cuff at home and contact the office if it is consistently in the 40s or he becomes symptomatic. Preoperative cardiovascular risk assessment. Prostate biopsy. According to the RCRI, patient has a 0.9% risk of MACE. Patient reports activity equivalent to >4.0 METS (walking, exercising at the gym). Based on ACC/AHA guidelines, ZEPH SISTARE would be at acceptable risk for the planned procedure without further cardiovascular testing. Per office protocol, he may hold Brilinta for 5 days prior to procedure and should resume as soon as hemodynamically stable postoperatively. Patient will continue aspirin through the perioperative period.   Disposition: Return as previously scheduled or sooner as needed.         Signed, Etta Grandchild. Amiylah Anastos, DNP, NP-C

## 2022-11-22 NOTE — Progress Notes (Signed)
11/16/2022 9:20 AM   Ricky Chavez Mar 15, 1956 [id number]  Referring provider: Jeoffrey Massed, Chavez [address] [place],  [place] [address]  Elevated PSA   HPI: Mr Por is a 66yo here for followup for elevated PSA. PSA increased to 13.1 from 6.3. No worsening LUTS. No other complaints today   PMH: Past Medical History:  Diagnosis Date   Allergic rhinitis    BPH with obstruction/lower urinary tract symptoms    flomax and finasteride qod per urol   Coronary artery disease    With preserved EF.  Needs DAPT lifetime due to high risk status.   COVID 09/01/2020   Elevated PSA onset @ 2009   Neg bx x 3.  MRI c/w bph only.  PSAs stable as of 11/2019. Ricky Chavez   GERD (gastroesophageal reflux disease)    Hearing loss of both ears    History of meniscal tear 2023   L knee   History of myocardial infarction 2011   Hyperlipidemia    Hypertension    Insomnia    Myocardial infarction [facility])    Organic impotence    Palpitations 05/2018   PVCs   Sciatica    Piriformis syndrome likely--improving with gabapentin as of 04/2017.  May need MRI L spine and/or emg/NCS   Squamous cell carcinoma     Surgical History: Past Surgical History:  Procedure Laterality Date   CARDIAC CATHETERIZATION  01/31/13   Stents to LAD patent; small diagonal stent with 90% ostial stenosis--good overall LV function with mild anterior and focal inferoapical hypokinesis, EF 60-65%.  Unsuccessful angioplasty of diagonal.   CARDIOVASCULAR STRESS TEST  05/26/15   stress echo neg for ischemia   CARDIOVASCULAR STRESS TEST  10/04/2017   Myoc perf imaging: normal (EF 59%)   COLONOSCOPY  2008; 06/14/16   2008 Normal.  2018 hyperplastic polyp +internal hemorrhoids.  Recall 2028.   CYSTOSCOPY WITH INSERTION OF UROLIFT N/A 10/04/2021   Procedure: CYSTOSCOPY WITH INSERTION OF UROLIFT;  Surgeon: Ricky Gauze, Chavez;  Location: [facility];  Service: Urology;  Laterality: N/A;   HYDROCELE EXCISION /  REPAIR  early 2000's   INGUINAL HERNIA REPAIR Bilateral 1998   PROSTATE BIOPSY     Multiple; all neg.  Prostate MRI 2018 -NO SIGN OF PROSTATE CA.   PTCA     Cath 2011---4 stents--LAD and diagonal.  2014 repeat cath for CP showed no changes.   Scrotal u/s  01/2015   numerous extratesticular cystic lesions bilat, c/w numerous epididymal cysts vs loculated hydrocele--following expectantly.    Home Medications:  Allergies as of 11/16/2022       Reactions   Cephalexin Other (See Comments)   Cause the patient to develop Cdiff         Medication List        Accurate as of November 16, 2022 11:59 PM. If you have any questions, ask your nurse or doctor.          aspirin EC 81 MG tablet Take 81 mg by mouth daily.   atorvastatin 40 MG tablet Commonly known as: LIPITOR Take 1 tablet (40 mg total) by mouth daily.   Avodart 0.5 MG capsule Generic drug: dutasteride TAKE 1 CAPSULE EVERY OTHER DAY   carvedilol 6.25 MG tablet Commonly known as: Coreg Take 1 tablet (6.25 mg total) by mouth 2 (two) times daily.   diazepam 10 MG tablet Commonly known as: Valium Take 1 tablet (10 mg total) by mouth once for  1 dose. Take 30 minutes prior to procedure Started by: Ricky Chavez   levofloxacin 750 MG tablet Commonly known as: Levaquin Take 1 tablet (750 mg total) by mouth once for 1 dose. Take 1 hour prior to your prostate biopsy procedure. Started by: Ricky Chavez   lisinopril 5 MG tablet Commonly known as: ZESTRIL Take 1 tablet (5 mg total) by mouth daily.   melatonin 5 MG Tabs Take 5 mg by mouth at bedtime.   MULTIVITAMIN ADULT PO Take 1 capsule by mouth daily.   nitroGLYCERIN 0.4 MG/SPRAY spray Commonly known as: NITROLINGUAL Place 1 spray under the tongue every 5 (five) minutes x 3 doses as needed for chest pain.   omeprazole 20 MG capsule Commonly known as: PRILOSEC Take 20 mg by mouth daily.   ticagrelor 60 MG Tabs tablet Commonly known as:  Brilinta Take 1 tablet (60 mg total) by mouth 2 (two) times daily.        Allergies:  Allergies  Allergen Reactions   Cephalexin Other (See Comments)    Cause the patient to develop Cdiff     Family History: Family History  Problem Relation Age of Onset   Hyperlipidemia Mother    Hypertension Mother    Heart disease Mother 60       CABG   Other Mother        Neuroendocrine carcinoma pancreas (solid mass) and liver (diffuse)   Heart disease Father 8       CABG   Hyperlipidemia Father    Hypertension Father    Colon polyps Father    Hypothyroidism Sister    Heart attack Brother 52       MI   Prostate cancer Paternal Uncle    Peripheral vascular disease Brother        Carotid    Stroke Neg Hx    Diabetes Neg Hx     Social History:  reports that he quit smoking about 44 years ago. His smoking use included cigarettes. He has a 0.50 pack-year smoking history. He has never used smokeless tobacco. He reports current alcohol use of about 14.0 standard drinks of alcohol per week. He reports that he does not use drugs.  ROS: All other review of systems were reviewed and are negative except what is noted above in HPI  Physical Exam: BP 132/78   Pulse (!) 54   Ht 5\' 9"  (1.753 m)   Wt 160 lb (72.6 kg)   BMI 23.63 kg/m   Constitutional:  Alert and oriented, No acute distress. HEENT: [place] AT, moist mucus membranes.  Trachea midline, no masses. Cardiovascular: No clubbing, cyanosis, or edema. Respiratory: Normal respiratory effort, no increased work of breathing. GI: Abdomen is soft, nontender, nondistended, no abdominal masses GU: No CVA tenderness.  Lymph: No cervical or inguinal lymphadenopathy. Skin: No rashes, bruises or suspicious lesions. Neurologic: Grossly intact, no focal deficits, moving all 4 extremities. Psychiatric: Normal mood and affect.  Laboratory Data: Lab Results  Component Value Date   WBC 10.3 12/21/2021   HGB 15.3 12/21/2021   HCT 44.4 12/21/2021    MCV 89.9 12/21/2021   PLT 194 12/21/2021    Lab Results  Component Value Date   CREATININE 0.99 12/21/2021    Lab Results  Component Value Date   PSA 6.62 (H) 12/21/2021   PSA 4.4 (H) 11/25/2019   PSA 5.20 11/20/2019    Lab Results  Component Value Date   TESTOSTERONE 414 11/25/2019    Lab Results  Component Value Date   HGBA1C 5.6 12/21/2021    Urinalysis    Component Value Date/Time   APPEARANCEUR Clear 09/21/2022 1010   GLUCOSEU Negative 09/21/2022 1010   BILIRUBINUR Negative 09/21/2022 1010   PROTEINUR Negative 09/21/2022 1010   NITRITE Negative 09/21/2022 1010   LEUKOCYTESUR Negative 09/21/2022 1010    Lab Results  Component Value Date   LABMICR Comment 09/21/2022   WBCUA >30 (A) 01/17/2022   LABEPIT 0-10 01/17/2022   BACTERIA Many (A) 01/17/2022    Pertinent Imaging:  No results found for this or any previous visit.  No results found for this or any previous visit.  No results found for this or any previous visit.  No results found for this or any previous visit.  No results found for this or any previous visit.  No valid procedures specified. No results found for this or any previous visit.  No results found for this or any previous visit.   Assessment & Plan:    1. Elevated PSA The patient and I talked about etiologies of elevated PSA.  We discussed the possible relationship between elevated PSA, prostate cancer, BPH, prostatitis, and UTI.   Conservative treatment of elevated PSA with watchful waiting was discussed with the patient.  All questions were answered.        All of the risks and benefits along with alternatives to prostate biopsy were discussed with the patient.  The patient gave fully informed consent to proceed with a transrectal ultrasound guided biopsy of the prostate for the evaluation of their evated PSA.  Prostate biopsy instructions and antibiotics were given to the patient.  - PSA - levofloxacin (LEVAQUIN) 750 MG  tablet; Take 1 tablet (750 mg total) by mouth once for 1 dose. Take 1 hour prior to your prostate biopsy procedure.  Dispense: 1 tablet; Refill: 0 - US Guided Needle Placement - [place] PROSTATE BIOPSY MULTIPLE - [place] Transrectal Complete   No follow-ups on file.  Ricky Chavez  [facility] [place]

## 2022-11-23 ENCOUNTER — Ambulatory Visit: Payer: Medicare Other | Attending: Student | Admitting: Student

## 2022-11-23 ENCOUNTER — Encounter: Payer: Self-pay | Admitting: Student

## 2022-11-23 VITALS — BP 118/76 | HR 49 | Ht 68.0 in | Wt 161.0 lb

## 2022-11-23 DIAGNOSIS — I2510 Atherosclerotic heart disease of native coronary artery without angina pectoris: Secondary | ICD-10-CM | POA: Insufficient documentation

## 2022-11-23 DIAGNOSIS — E785 Hyperlipidemia, unspecified: Secondary | ICD-10-CM | POA: Diagnosis not present

## 2022-11-23 DIAGNOSIS — Z01810 Encounter for preprocedural cardiovascular examination: Secondary | ICD-10-CM | POA: Diagnosis present

## 2022-11-23 DIAGNOSIS — I10 Essential (primary) hypertension: Secondary | ICD-10-CM | POA: Insufficient documentation

## 2022-11-23 DIAGNOSIS — Z01818 Encounter for other preprocedural examination: Secondary | ICD-10-CM | POA: Insufficient documentation

## 2022-11-23 DIAGNOSIS — R002 Palpitations: Secondary | ICD-10-CM | POA: Insufficient documentation

## 2022-11-23 MED ORDER — TICAGRELOR 60 MG PO TABS: 60.0000 mg | ORAL_TABLET | Freq: Two times a day (BID) | ORAL | 3 refills | Status: DC

## 2022-11-23 MED ORDER — NITROGLYCERIN 0.4 MG/SPRAY TL SOLN: 1.0000 | 3 refills | Status: AC | PRN

## 2022-11-23 MED ORDER — CARVEDILOL 6.25 MG PO TABS: 6.2500 mg | ORAL_TABLET | Freq: Two times a day (BID) | ORAL | 3 refills | Status: DC

## 2022-11-23 MED ORDER — ATORVASTATIN CALCIUM 40 MG PO TABS: 40.0000 mg | ORAL_TABLET | Freq: Every day | ORAL | 3 refills | Status: DC

## 2022-11-23 MED ORDER — LISINOPRIL 5 MG PO TABS: 5.0000 mg | ORAL_TABLET | Freq: Every day | ORAL | 3 refills | Status: DC

## 2022-11-23 NOTE — Patient Instructions (Signed)
Medication Instructions:  Your physician recommends that you continue on your current medications as directed. Please refer to the Current Medication list given to you today.  *If you need a refill on your cardiac medications before your next appointment, please call your pharmacy*   Lab Work: None   Testing/Procedures: None   Follow-Up: At [facility], you and your health needs are our priority.  As part of our continuing mission to provide you with exceptional heart care, we have created designated Provider Care Teams.  These Care Teams include your primary Cardiologist (physician) and Advanced Practice Providers (APPs -  Physician Assistants and Nurse Practitioners) who all work together to provide you with the care you need, when you need it.  Your next appointment:   1 year(s)  Provider:   James Hochrein, MD    

## 2022-12-12 ENCOUNTER — Other Ambulatory Visit: Payer: Medicare Other | Admitting: Urology

## 2022-12-15 ENCOUNTER — Encounter: Payer: Self-pay | Admitting: Urology

## 2022-12-19 NOTE — Patient Instructions (Signed)

## 2022-12-21 ENCOUNTER — Encounter (HOSPITAL_COMMUNITY): Payer: Self-pay

## 2022-12-21 ENCOUNTER — Ambulatory Visit (HOSPITAL_BASED_OUTPATIENT_CLINIC_OR_DEPARTMENT_OTHER): Payer: Medicare Other | Admitting: Urology

## 2022-12-21 ENCOUNTER — Ambulatory Visit (HOSPITAL_COMMUNITY)
Admission: RE | Admit: 2022-12-21 | Discharge: 2022-12-21 | Disposition: A | Discharge status: Home / Self Care | Payer: Medicare Other | Source: Ambulatory Visit | Attending: Urology | Admitting: Urology

## 2022-12-21 ENCOUNTER — Other Ambulatory Visit: Payer: Self-pay | Admitting: Urology

## 2022-12-21 VITALS — BP 113/68 | HR 56 | Temp 98.0°F | Resp 18

## 2022-12-21 DIAGNOSIS — C61 Malignant neoplasm of prostate: Secondary | ICD-10-CM | POA: Diagnosis not present

## 2022-12-21 DIAGNOSIS — R972 Elevated prostate specific antigen [PSA]: Secondary | ICD-10-CM | POA: Diagnosis present

## 2022-12-21 MED ORDER — GENTAMICIN SULFATE 40 MG/ML IJ SOLN
80.0000 mg | Freq: Once | INTRAMUSCULAR | Status: AC
  Administered 2022-12-21: 80 mg via INTRAMUSCULAR

## 2022-12-21 MED ORDER — LIDOCAINE HCL (PF) 2 % IJ SOLN
INTRAMUSCULAR | Status: AC
  Filled 2022-12-21: qty 10

## 2022-12-21 MED ORDER — LIDOCAINE HCL (PF) 2 % IJ SOLN
10.0000 mL | Freq: Once | INTRAMUSCULAR | Status: AC
  Administered 2022-12-21: 10 mL

## 2022-12-21 MED ORDER — GENTAMICIN SULFATE 40 MG/ML IJ SOLN
INTRAMUSCULAR | Status: AC
  Filled 2022-12-21: qty 2

## 2022-12-21 NOTE — Progress Notes (Signed)
PT tolerated prostate biopsy procedure and antibiotic injection well today. Labs obtained and sent for pathology by Lexi from ultrasound at 1310. PT ambulatory at discharge with no acute distress noted and verbalized understanding of discharge instructions. PT to follow up with urologist as scheduled on 01/04/23.

## 2022-12-22 ENCOUNTER — Encounter: Payer: Self-pay | Admitting: Urology

## 2022-12-22 ENCOUNTER — Encounter: Payer: Self-pay | Admitting: Family Medicine

## 2022-12-22 ENCOUNTER — Ambulatory Visit (INDEPENDENT_AMBULATORY_CARE_PROVIDER_SITE_OTHER): Payer: Medicare Other | Admitting: Family Medicine

## 2022-12-22 VITALS — BP 105/74 | HR 57 | Ht 68.5 in | Wt 163.0 lb

## 2022-12-22 DIAGNOSIS — Z0000 Encounter for general adult medical examination without abnormal findings: Secondary | ICD-10-CM | POA: Diagnosis not present

## 2022-12-22 DIAGNOSIS — I10 Essential (primary) hypertension: Secondary | ICD-10-CM

## 2022-12-22 DIAGNOSIS — Z125 Encounter for screening for malignant neoplasm of prostate: Secondary | ICD-10-CM | POA: Diagnosis not present

## 2022-12-22 DIAGNOSIS — E7800 Pure hypercholesterolemia, unspecified: Secondary | ICD-10-CM

## 2022-12-22 DIAGNOSIS — R7303 Prediabetes: Secondary | ICD-10-CM | POA: Diagnosis not present

## 2022-12-22 DIAGNOSIS — Z23 Encounter for immunization: Secondary | ICD-10-CM

## 2022-12-22 LAB — COMPREHENSIVE METABOLIC PANEL
ALT: 24 U/L (ref 0–53)
AST: 24 U/L (ref 0–37)
Albumin: 4.3 g/dL (ref 3.5–5.2)
Alkaline Phosphatase: 63 U/L (ref 39–117)
BUN: 16 mg/dL (ref 6–23)
CO2: 30 mEq/L (ref 19–32)
Calcium: 10 mg/dL (ref 8.4–10.5)
Chloride: 102 mEq/L (ref 96–112)
Creatinine, Ser: 0.97 mg/dL (ref 0.40–1.50)
GFR: 81.73 mL/min (ref >60.00)
Glucose, Bld: 112 mg/dL — ABNORMAL HIGH (ref 70–99)
Potassium: 4.8 mEq/L (ref 3.5–5.1)
Sodium: 139 mEq/L (ref 135–145)
Total Bilirubin: 1 mg/dL (ref 0.2–1.2)
Total Protein: 6.6 g/dL (ref 6.0–8.3)

## 2022-12-22 LAB — LIPID PANEL
Cholesterol: 124 mg/dL (ref 0–200)
HDL: 48.7 mg/dL (ref >39.00)
LDL Cholesterol: 50 mg/dL (ref 0–99)
NonHDL: 75.55
Total CHOL/HDL Ratio: 3
Triglycerides: 127 mg/dL (ref 0.0–149.0)
VLDL: 25.4 mg/dL (ref 0.0–40.0)

## 2022-12-22 LAB — CBC WITH DIFFERENTIAL/PLATELET
Basophils Absolute: 0.1 10*3/uL (ref 0.0–0.1)
Basophils Relative: 0.7 % (ref 0.0–3.0)
Eosinophils Absolute: 0.2 10*3/uL (ref 0.0–0.7)
Eosinophils Relative: 2.5 % (ref 0.0–5.0)
HCT: 43.3 % (ref 39.0–52.0)
Hemoglobin: 14.3 g/dL (ref 13.0–17.0)
Lymphocytes Relative: 26.4 % (ref 12.0–46.0)
Lymphs Abs: 1.9 10*3/uL (ref 0.7–4.0)
MCHC: 32.9 g/dL (ref 30.0–36.0)
MCV: 92.7 fl (ref 78.0–100.0)
Monocytes Absolute: 0.5 10*3/uL (ref 0.1–1.0)
Monocytes Relative: 6.4 % (ref 3.0–12.0)
Neutro Abs: 4.6 10*3/uL (ref 1.4–7.7)
Neutrophils Relative %: 64 % (ref 43.0–77.0)
Platelets: 199 10*3/uL (ref 150.0–400.0)
RBC: 4.67 Mil/uL (ref 4.22–5.81)
RDW: 13.4 % (ref 11.5–15.5)
WBC: 7.2 10*3/uL (ref 4.0–10.5)

## 2022-12-22 LAB — HEMOGLOBIN A1C: Hgb A1c MFr Bld: 5.7 % (ref 4.6–6.5)

## 2022-12-22 NOTE — Patient Instructions (Signed)
Transrectal Ultrasound-Guided Prostate Biopsy, Care After What can I expect after the procedure? After the procedure, it is common to have: Pain and discomfort near your butt (rectum), especially while sitting. Pink-colored pee (urine). This is due to small amounts of blood in your pee. A burning feeling while peeing. Blood in your poop (stool). Bleeding from your butt. Blood in your semen. Follow these instructions at home: Medicines Take over-the-counter and prescription medicines only as told by your doctor. If you were given a sedative during your procedure, do not drive or use machines until your doctor says that it is safe. A sedative is a medicine that helps you relax. If you were prescribed an antibiotic medicine, take it as told by your doctor. Do not stop taking it even if you start to feel better. Activity  Return to your normal activities when your doctor says that it is safe. Ask your doctor when it is okay for you to have sex. You may have to avoid lifting. Ask your doctor how much you can safely lift. General instructions  Drink enough water to keep your pee pale yellow. Watch your pee, poop, and semen for new bleeding or bleeding that gets worse. Keep all follow-up visits. Contact a doctor if: You have any of these: Blood clots in your pee or poop. Blood in your pee more than 2 weeks after the procedure. Blood in your semen more than 2 months after the procedure. New or worse bleeding in your pee, poop, or semen. Very bad belly pain. Your pee smells bad or unusual. You have trouble peeing. Your lower belly feels firm. You have problems getting an erection. You feel like you may vomit (are nauseous), or you vomit. Get help right away if: You have a fever or chills. You have bright red pee. You have very bad pain that does not get better with medicine. You cannot pee. Summary After this procedure, it is common to have pain and discomfort near your butt,  especially while sitting. You may have blood in your pee and poop. It is common to have blood in your semen. Get help right away if you have a fever or chills. This information is not intended to replace advice given to you by your health care provider. Make sure you discuss any questions you have with your health care provider. Document Revised: 11/16/2020 Document Reviewed: 11/16/2020 Elsevier Patient Education  2024 [employer].

## 2022-12-22 NOTE — Progress Notes (Signed)
Prostate Biopsy Procedure   Informed consent was obtained after discussing risks/benefits of the procedure.  A time out was performed to ensure correct patient identity.  Pre-Procedure: - Last PSA Level:  Lab Results  Component Value Date   PSA 6.62 (H) 12/21/2021   PSA 4.4 (H) 11/25/2019   PSA 5.20 11/20/2019   - Gentamicin given prophylactically - Levaquin 500 mg administered PO -Transrectal Ultrasound performed revealing a 94.6 gm prostate -No significant hypoechoic or median lobe noted  Procedure: - Prostate block performed using 10 cc 1% lidocaine and biopsies taken from sextant areas, a total of 12 under ultrasound guidance.  Post-Procedure: - Patient tolerated the procedure well - He was counseled to seek immediate medical attention if experiences any severe pain, significant bleeding, or fevers - Return in one week to discuss biopsy results

## 2022-12-22 NOTE — Progress Notes (Signed)
Office Note 12/22/2022  CC:  Chief Complaint  Patient presents with   Annual Exam    Pt is fasting.     HPI:  Patient is a 67 y.o. male who is here for annual health maintenance exam and 55-month follow-up hypertension, hyperlipidemia, and prediabetes. Feeling well. No home bp monitoring.  NOOM program--->25 lb of purposeful wt loss.  PSA rose when tested at urology recently and he got a prostate biopsy yesterday.  Past Medical History:  Diagnosis Date   Allergic rhinitis    BPH with obstruction/lower urinary tract symptoms    flomax and finasteride qod per urol   Coronary artery disease    With preserved EF.  Needs DAPT lifetime due to high risk status.   COVID 09/01/2020   Elevated PSA onset @ 2009   Neg bx x 3.  MRI c/w bph only.  PSA inc 2024-->rpt bx (results pending as of 12/22/22)   GERD (gastroesophageal reflux disease)    Hearing loss of both ears    History of meniscal tear 2023   L knee   History of myocardial infarction 2011   Hyperlipidemia    Hypertension    Insomnia    Myocardial infarction [facility])    Organic impotence    Palpitations 05/2018   PVCs   Sciatica    Piriformis syndrome likely--improving with gabapentin as of 04/2017.  May need MRI L spine and/or emg/NCS   Squamous cell carcinoma     Past Surgical History:  Procedure Laterality Date   CARDIAC CATHETERIZATION  01/31/2013   Stents to LAD patent; small diagonal stent with 90% ostial stenosis--good overall LV function with mild anterior and focal inferoapical hypokinesis, EF 60-65%.  Unsuccessful angioplasty of diagonal.   CARDIOVASCULAR STRESS TEST  05/26/2015   stress echo neg for ischemia   CARDIOVASCULAR STRESS TEST  10/04/2017   Myoc perf imaging: normal (EF 59%)   COLONOSCOPY  2008; 06/14/16   2008 Normal.  2018 hyperplastic polyp +internal hemorrhoids.  Recall 2028.   CYSTOSCOPY WITH INSERTION OF UROLIFT N/A 10/04/2021   Procedure: CYSTOSCOPY WITH INSERTION OF UROLIFT;  Surgeon:  Malen Gauze, MD;  Location: [facility];  Service: Urology;  Laterality: N/A;   HYDROCELE EXCISION / REPAIR  early 2000's   INGUINAL HERNIA REPAIR Bilateral 1998   PROSTATE BIOPSY     Multiple; all neg.  Prostate MRI 2018 -NO SIGN OF PROSTATE CA.Marland Kitchen Rpt bx 12/2022->results pending as of 12/22/22   PTCA     Cath 2011---4 stents--LAD and diagonal.  2014 repeat cath for CP showed no changes.   Scrotal u/s  01/2015   numerous extratesticular cystic lesions bilat, c/w numerous epididymal cysts vs loculated hydrocele--following expectantly.    Family History  Problem Relation Age of Onset   Hyperlipidemia Mother    Hypertension Mother    Heart disease Mother 95       CABG   Other Mother        Neuroendocrine carcinoma pancreas (solid mass) and liver (diffuse)   Heart disease Father 75       CABG   Hyperlipidemia Father    Hypertension Father    Colon polyps Father    Hypothyroidism Sister    Heart attack Brother 5       MI   Prostate cancer Paternal Uncle    Peripheral vascular disease Brother        Carotid    Stroke Neg Hx    Diabetes Neg Hx  Social History   Socioeconomic History   Marital status: Married    Spouse name: Not on file   Number of children: 2   Years of education: Not on file   Highest education level: Master's degree (e.g., MA, MS, MEng, MEd, MSW, MBA)  Occupational History   Occupation: retired  Tobacco Use   Smoking status: Former    Current packs/day: 0.00    Average packs/day: 0.3 packs/day for 2.0 years (0.5 ttl pk-yrs)    Types: Cigarettes    Start date: 06/06/1976    Quit date: 06/06/1978    Years since quitting: 44.5   Smokeless tobacco: Never  Vaping Use   Vaping status: Never Used  Substance and Sexual Activity   Alcohol use: Yes    Alcohol/week: 14.0 standard drinks of alcohol    Types: 14 Glasses of wine per week   Drug use: No   Sexual activity: Yes  Other Topics Concern   Not on file  Social History Narrative   Married, 2 grown  children.   Relocated to [place] from [facility] 2016.   Educ: Masters degree   Occup: Retired Emergency planning/management officer with [employer]   No tob.   Alc: 1 glass wine/night.   Social Determinants of Health   Financial Resource Strain: Low Risk  (06/21/2022)   Overall Financial Resource Strain (CARDIA)    Difficulty of Paying Living Expenses: Not hard at all  Food Insecurity: No Food Insecurity (06/21/2022)   Hunger Vital Sign    Worried About Running Out of Food in the Last Year: Never true    Ran Out of Food in the Last Year: Never true  Transportation Needs: No Transportation Needs (06/21/2022)   PRAPARE - Administrator, Civil Service (Medical): No    Lack of Transportation (Non-Medical): No  Physical Activity: Insufficiently Active (06/21/2022)   Exercise Vital Sign    Days of Exercise per Week: 2 days    Minutes of Exercise per Session: 60 min  Stress: No Stress Concern Present (06/21/2022)   [employer] of Occupational Health - Occupational Stress Questionnaire    Feeling of Stress : Not at all  Social Connections: Socially Integrated (06/21/2022)   Social Connection and Isolation Panel [NHANES]    Frequency of Communication with Friends and Family: More than three times a week    Frequency of Social Gatherings with Friends and Family: More than three times a week    Attends Religious Services: More than 4 times per year    Active Member of [employer] or Organizations: Yes    Attends Engineer, structural: More than 4 times per year    Marital Status: Married  Catering manager Violence: Not on file    Outpatient Medications Prior to Visit  Medication Sig Dispense Refill   aspirin EC 81 MG tablet Take 81 mg by mouth daily.     atorvastatin (LIPITOR) 40 MG tablet Take 1 tablet (40 mg total) by mouth daily. 90 tablet 3   carvedilol (COREG) 6.25 MG tablet Take 1 tablet (6.25 mg total) by mouth 2 (two) times daily. 180 tablet 3   lisinopril (ZESTRIL) 5 MG tablet Take 1  tablet (5 mg total) by mouth daily. 90 tablet 3   melatonin 5 MG TABS Take 5 mg by mouth at bedtime.     Multiple Vitamins-Minerals (MULTIVITAMIN ADULT PO) Take 1 capsule by mouth daily.     nitroGLYCERIN (NITROLINGUAL) 0.4 MG/SPRAY spray Place 1 spray under the tongue every 5 (  five) minutes x 3 doses as needed for chest pain. 12 g 3   omeprazole (PRILOSEC) 20 MG capsule Take 20 mg by mouth daily.     ticagrelor (BRILINTA) 60 MG TABS tablet Take 1 tablet (60 mg total) by mouth 2 (two) times daily. 180 tablet 3   AVODART 0.5 MG capsule TAKE 1 CAPSULE EVERY OTHER DAY 30 capsule 3   No facility-administered medications prior to visit.    Allergies  Allergen Reactions   Cephalexin Other (See Comments)    Cause the patient to develop Cdiff     Review of Systems  Constitutional:  Negative for appetite change, chills, fatigue and fever.  HENT:  Negative for congestion, dental problem, ear pain and sore throat.   Eyes:  Negative for discharge, redness and visual disturbance.  Respiratory:  Negative for cough, chest tightness, shortness of breath and wheezing.   Cardiovascular:  Negative for chest pain, palpitations and leg swelling.  Gastrointestinal:  Negative for abdominal pain, blood in stool, diarrhea, nausea and vomiting.  Genitourinary:  Negative for difficulty urinating, dysuria, flank pain, frequency, hematuria and urgency.  Musculoskeletal:  Negative for arthralgias, back pain, joint swelling, myalgias and neck stiffness.  Skin:  Negative for pallor and rash.  Neurological:  Negative for dizziness, speech difficulty, weakness and headaches.  Hematological:  Negative for adenopathy. Does not bruise/bleed easily.  Psychiatric/Behavioral:  Negative for confusion and sleep disturbance. The patient is not nervous/anxious.     PE;    12/22/2022    8:35 AM 12/21/2022   12:20 PM 11/23/2022   11:25 AM  Vitals with BMI  Height 5' 8.5"  5\' 8"   Weight 163 lbs  161 lbs  BMI 24.42  24.49   Systolic 105 113 161  Diastolic 74 68 76  Pulse 57 56 49   Gen: Alert, well appearing.  Patient is oriented to person, place, time, and situation. AFFECT: pleasant, lucid thought and speech. ENT: Ears: EACs clear, normal epithelium.  TMs with good light reflex and landmarks bilaterally.  Eyes: no injection, icteris, swelling, or exudate.  EOMI, PERRLA. Nose: no drainage or turbinate edema/swelling.  No injection or focal lesion.  Mouth: lips without lesion/swelling.  Oral mucosa pink and moist.  Dentition intact and without obvious caries or gingival swelling.  Oropharynx without erythema, exudate, or swelling.  Neck: supple/nontender.  No LAD, mass, or TM.  Carotid pulses 2+ bilaterally, without bruits. CV: RRR, no m/r/g.   LUNGS: CTA bilat, nonlabored resps, good aeration in all lung fields. ABD: soft, NT, ND, BS normal.  No hepatospenomegaly or mass.  No bruits. EXT: no clubbing, cyanosis, or edema.  Musculoskeletal: no joint swelling, erythema, warmth, or tenderness.  ROM of all joints intact. Skin - no sores or suspicious lesions or rashes or color changes  Pertinent labs:  Lab Results  Component Value Date   TSH 1.25 12/21/2021   Lab Results  Component Value Date   WBC 10.3 12/21/2021   HGB 15.3 12/21/2021   HCT 44.4 12/21/2021   MCV 89.9 12/21/2021   PLT 194 12/21/2021   Lab Results  Component Value Date   CREATININE 0.99 12/21/2021   BUN 14 12/21/2021   NA 140 12/21/2021   K 4.8 12/21/2021   CL 105 12/21/2021   CO2 25 12/21/2021   Lab Results  Component Value Date   ALT 32 12/21/2021   AST 24 12/21/2021   ALKPHOS 72 09/01/2016   BILITOT 0.8 12/21/2021   Lab Results  Component Value  Date   CHOL 141 12/21/2021   Lab Results  Component Value Date   HDL 46 12/21/2021   Lab Results  Component Value Date   LDLCALC 73 12/21/2021   Lab Results  Component Value Date   TRIG 140 12/21/2021   Lab Results  Component Value Date   CHOLHDL 3.1 12/21/2021    Lab Results  Component Value Date   PSA 6.62 (H) 12/21/2021   PSA 4.4 (H) 11/25/2019   PSA 5.20 11/20/2019   Lab Results  Component Value Date   HGBA1C 5.6 12/21/2021   ASSESSMENT AND PLAN:   #1 health maintenance exam: Reviewed age and gender appropriate health maintenance issues (prudent diet, regular exercise, health risks of tobacco and excessive alcohol, use of seatbelts, fire alarms in home, use of sunscreen).  Also reviewed age and gender appropriate health screening as well as vaccine recommendations. Vaccines: Prevnar 20 -->today.  Otherwise all up-to-date. Labs: Fasting health panel, hemoglobin A1c (prediabetes). Prostate ca screening: followed closely by urol for hx of elevated PSA. Colon ca screening: recall 2028 Skin cancer screening--he goes to a dermatologist annually for this.  #2 hypertension, well-controlled on lisinopril 5 mg a day and carvedilol 6.25 mg twice daily. Electrolytes and creatinine today.  3.  Hypercholesterolemia.  Doing well on Lipitor 40 mg a day. LDL goal less than 70. LDL was 73 about 1 year ago. Lipid panel and hepatic panel today.  An After Visit Summary was printed and given to the patient.  FOLLOW UP:  Return in about 6 months (around 06/24/2023) for routine chronic illness f/u.  Signed:  Santiago Bumpers, MD           12/22/2022

## 2022-12-23 ENCOUNTER — Encounter: Payer: Self-pay | Admitting: Urology

## 2022-12-23 ENCOUNTER — Telehealth: Payer: Self-pay

## 2022-12-23 NOTE — Telephone Encounter (Signed)
Return call to patient, patient states that he review over his biopsy and he wants to get treatment started ASAP. Patient is made aware that Dr. Ronne Binning review biopsy with patient at their scheduled office visit and will give patient his recommendation at the time of the office visit. Patient is made aware that his scheduled appointment is the earliest appointment and if there are any cancellation the office will reach out. Patient voiced understanding

## 2022-12-26 ENCOUNTER — Ambulatory Visit: Payer: Medicare Other | Admitting: Urology

## 2023-01-04 ENCOUNTER — Encounter: Payer: Self-pay | Admitting: Urology

## 2023-01-04 ENCOUNTER — Ambulatory Visit (INDEPENDENT_AMBULATORY_CARE_PROVIDER_SITE_OTHER): Payer: Medicare Other | Admitting: Urology

## 2023-01-04 VITALS — BP 158/82 | HR 49

## 2023-01-04 DIAGNOSIS — C61 Malignant neoplasm of prostate: Secondary | ICD-10-CM

## 2023-01-04 DIAGNOSIS — R972 Elevated prostate specific antigen [PSA]: Secondary | ICD-10-CM

## 2023-01-04 NOTE — Progress Notes (Signed)
01/04/2023 3:30 PM   [name] 10-31-1955 [id number]  Referring provider: Jeoffrey Massed, MD [address] [place],  [place] [address]  Followup prostate biopsy   HPI: Mr Leinberger is a 66yo here for followup after prostate biopsy. Biopsy revealed Gleason 5+4=9 in 4/12 cores.  PSA 13.1. He has a hx of urolift for BPH.    PMH: Past Medical History:  Diagnosis Date   Allergic rhinitis    BPH with obstruction/lower urinary tract symptoms    flomax and finasteride qod per urol   Coronary artery disease    With preserved EF.  Needs DAPT lifetime due to high risk status.   COVID 09/01/2020   Elevated PSA onset @ 2009   Neg bx x 3.  MRI c/w bph only.  PSA inc 2024-->rpt bx (results pending as of 12/22/22)   GERD (gastroesophageal reflux disease)    Hearing loss of both ears    History of meniscal tear 2023   L knee   History of myocardial infarction 2011   Hyperlipidemia    Hypertension    Insomnia    Myocardial infarction [facility])    Organic impotence    Palpitations 05/2018   PVCs   Sciatica    Piriformis syndrome likely--improving with gabapentin as of 04/2017.  May need MRI L spine and/or emg/NCS   Squamous cell carcinoma     Surgical History: Past Surgical History:  Procedure Laterality Date   CARDIAC CATHETERIZATION  01/31/2013   Stents to LAD patent; small diagonal stent with 90% ostial stenosis--good overall LV function with mild anterior and focal inferoapical hypokinesis, EF 60-65%.  Unsuccessful angioplasty of diagonal.   CARDIOVASCULAR STRESS TEST  05/26/2015   stress echo neg for ischemia   CARDIOVASCULAR STRESS TEST  10/04/2017   Myoc perf imaging: normal (EF 59%)   COLONOSCOPY  2008; 06/14/16   2008 Normal.  2018 hyperplastic polyp +internal hemorrhoids.  Recall 2028.   CYSTOSCOPY WITH INSERTION OF UROLIFT N/A 10/04/2021   Procedure: CYSTOSCOPY WITH INSERTION OF UROLIFT;  Surgeon: Malen Gauze, MD;  Location: [facility];  Service: Urology;   Laterality: N/A;   HYDROCELE EXCISION / REPAIR  early 2000's   INGUINAL HERNIA REPAIR Bilateral 1998   PROSTATE BIOPSY     Multiple; all neg.  Prostate MRI 2018 -NO SIGN OF PROSTATE CA.Marland Kitchen Rpt bx 12/2022->results pending as of 12/22/22   PTCA     Cath 2011---4 stents--LAD and diagonal.  2014 repeat cath for CP showed no changes.   Scrotal u/s  01/2015   numerous extratesticular cystic lesions bilat, c/w numerous epididymal cysts vs loculated hydrocele--following expectantly.    Home Medications:  Allergies as of 01/04/2023       Reactions   Cephalexin Other (See Comments)   Cause the patient to develop Cdiff         Medication List        Accurate as of January 04, 2023  3:30 PM. If you have any questions, ask your nurse or doctor.          aspirin EC 81 MG tablet Take 81 mg by mouth daily.   atorvastatin 40 MG tablet Commonly known as: LIPITOR Take 1 tablet (40 mg total) by mouth daily.   carvedilol 6.25 MG tablet Commonly known as: Coreg Take 1 tablet (6.25 mg total) by mouth 2 (two) times daily.   lisinopril 5 MG tablet Commonly known as: ZESTRIL Take 1 tablet (5 mg total) by mouth  daily.   melatonin 5 MG Tabs Take 5 mg by mouth at bedtime.   MULTIVITAMIN ADULT PO Take 1 capsule by mouth daily.   nitroGLYCERIN 0.4 MG/SPRAY spray Commonly known as: NITROLINGUAL Place 1 spray under the tongue every 5 (five) minutes x 3 doses as needed for chest pain.   omeprazole 20 MG capsule Commonly known as: PRILOSEC Take 20 mg by mouth daily.   ticagrelor 60 MG Tabs tablet Commonly known as: Brilinta Take 1 tablet (60 mg total) by mouth 2 (two) times daily.        Allergies:  Allergies  Allergen Reactions   Cephalexin Other (See Comments)    Cause the patient to develop Cdiff     Family History: Family History  Problem Relation Age of Onset   Hyperlipidemia Mother    Hypertension Mother    Heart disease Mother 26       CABG   Other Mother         Neuroendocrine carcinoma pancreas (solid mass) and liver (diffuse)   Heart disease Father 53       CABG   Hyperlipidemia Father    Hypertension Father    Colon polyps Father    Hypothyroidism Sister    Heart attack Brother 40       MI   Prostate cancer Paternal Uncle    Peripheral vascular disease Brother        Carotid    Stroke Neg Hx    Diabetes Neg Hx     Social History:  reports that he quit smoking about 44 years ago. His smoking use included cigarettes. He started smoking about 46 years ago. He has a 0.5 pack-year smoking history. He has never used smokeless tobacco. He reports current alcohol use of about 14.0 standard drinks of alcohol per week. He reports that he does not use drugs.  ROS: All other review of systems were reviewed and are negative except what is noted above in HPI  Physical Exam: There were no vitals taken for this visit.  Constitutional:  Alert and oriented, No acute distress. HEENT: [place] AT, moist mucus membranes.  Trachea midline, no masses. Cardiovascular: No clubbing, cyanosis, or edema. Respiratory: Normal respiratory effort, no increased work of breathing. GI: Abdomen is soft, nontender, nondistended, no abdominal masses GU: No CVA tenderness.  Lymph: No cervical or inguinal lymphadenopathy. Skin: No rashes, bruises or suspicious lesions. Neurologic: Grossly intact, no focal deficits, moving all 4 extremities. Psychiatric: Normal mood and affect.  Laboratory Data: Lab Results  Component Value Date   WBC 7.2 12/22/2022   HGB 14.3 12/22/2022   HCT 43.3 12/22/2022   MCV 92.7 12/22/2022   PLT 199.0 12/22/2022    Lab Results  Component Value Date   CREATININE 0.97 12/22/2022    Lab Results  Component Value Date   PSA 6.62 (H) 12/21/2021   PSA 4.4 (H) 11/25/2019   PSA 5.20 11/20/2019    Lab Results  Component Value Date   TESTOSTERONE 414 11/25/2019    Lab Results  Component Value Date   HGBA1C 5.7 12/22/2022    Urinalysis     Component Value Date/Time   APPEARANCEUR Clear 09/21/2022 1010   GLUCOSEU Negative 09/21/2022 1010   BILIRUBINUR Negative 09/21/2022 1010   PROTEINUR Negative 09/21/2022 1010   NITRITE Negative 09/21/2022 1010   LEUKOCYTESUR Negative 09/21/2022 1010    Lab Results  Component Value Date   LABMICR Comment 09/21/2022   WBCUA >30 (A) 01/17/2022   LABEPIT 0-10  01/17/2022   BACTERIA Many (A) 01/17/2022    Pertinent Imaging:  No results found for this or any previous visit.  No results found for this or any previous visit.  No results found for this or any previous visit.  No results found for this or any previous visit.  No results found for this or any previous visit.  No valid procedures specified. No results found for this or any previous visit.  No results found for this or any previous visit.   Assessment & Plan:    1. Prostate cancer [facility]) I discussed the natural history of high risk prostate cancer with the patient and the various treatment options including active surveillance, RALP, IMRT, brachytherapy, cryotherapy, HIFU and ADT. We will obtain PSMA PET prior to scheduling definitive therapy. I will refer him to both Dr. Berneice Heinrich for consideration of RALP and Dr. Kathrynn Running for consideration of radiation therapy.    No follow-ups on file.  Wilkie Aye, MD  [facility] [place]

## 2023-01-04 NOTE — Patient Instructions (Signed)
Prostate Cancer  The prostate is a small gland that produces fluid that makes up semen (seminal fluid). It is located below the bladder in men, in front of the rectum. Prostate cancer is the abnormal growth of cells in the prostate gland. What are the causes? The exact cause of this condition is not known. What increases the risk? You are more likely to develop this condition if: You are 66 years of age or older. You have a family history of prostate cancer. You have a family history of breast and ovarian cancer. You have genes that are passed from parent to child (inherited), such as BRCA1 and BRCA2. You have Lynch syndrome. African American men and men of African descent are diagnosed with prostate cancer at higher rates than other men. The reasons for this are not well understood and are likely due to a combination of genetic and environmental factors. What are the signs or symptoms? Symptoms of this condition include: Problems with urination. This may include: A weak or interrupted flow of urine. Trouble starting or stopping urination. Trouble emptying the bladder all the way. The need to urinate more often, especially at night. Blood in urine or semen. Persistent pain or discomfort in the lower back, lower abdomen, or hips. Trouble getting an erection. Weakness or numbness in the legs or feet. How is this diagnosed? This condition can be diagnosed with: A digital rectal exam. For this exam, a health care provider inserts a gloved finger into the rectum to feel the prostate gland. A blood test called a prostate-specific antigen (PSA) test. A procedure in which a sample of tissue is taken from the prostate and checked under a microscope (prostate biopsy). An imaging test called transrectal ultrasonography. Once the condition is diagnosed, tests will be done to determine how far the cancer has spread. This is called staging the cancer. Staging may involve imaging tests, such as a bone  scan, CT scan, PET scan, or MRI. Stages of prostate cancer The stages of prostate cancer are as follows: Stage 1 (I). At this stage, the cancer is found in the prostate only. The cancer is not visible on imaging tests, and it is usually found by accident, such as during prostate surgery. Stage 2 (II). At this stage, the cancer is more advanced than it is in stage 1, but the cancer has not spread outside the prostate. Stage 3 (III). At this stage, the cancer has spread beyond the outer layer of the prostate to nearby tissues. The cancer may be found in the seminal vesicles, which are near the bladder and the prostate. Stage 4 (IV). At this stage, the cancer has spread to other parts of the body, such as the lymph nodes, bones, bladder, rectum, liver, or lungs. Prostate cancer grading Prostate cancer is also graded according to how the cancer cells look under a microscope. This is called the Gleason score and the total score can range from 6-10, indicating how likely it is that the cancer will spread (metastasize) to other parts of the body. The higher the score, the greater the likelihood that the cancer will spread. Gleason 6 or lower: This indicates that the cancer cells look similar to normal prostate cells (well differentiated). Gleason 7: This indicates that the cancer cells look somewhat similar to normal prostate cells (moderately differentiated). Gleason 8, 9, or 10: This indicates that the cancer cells look very different than normal prostate cells (poorly differentiated). How is this treated? Treatment for this condition depends on several  factors, including the stage of the cancer, your age, personal preferences, and your overall health. Talk with your health care provider about treatment options that are recommended for you. Common treatments include: Observation for early stage prostate cancer (active surveillance). This involves having exams, blood tests, and in some cases, more biopsies.  For some men, this is the only treatment needed. Surgery. Types of surgeries include: Open surgery (radical prostatectomy). In this surgery, a larger incision is made to remove the prostate. A laparoscopic radical prostatectomy. This is a surgery to remove the prostate and lymph nodes through several small incisions. It is often referred to as a minimally invasive surgery. A robotic radical prostatectomy. This is laparoscopic surgery to remove the prostate and lymph nodes with the help of robotic arms that are controlled by the surgeon. Cryoablation. This is surgery to freeze and destroy cancer cells. Radiation treatment. Types of radiation treatment include: External beam radiation. This type aims beams of radiation from outside the body at the prostate to destroy cancerous cells. Brachytherapy. This type uses radioactive needles, seeds, wires, or tubes that are implanted into the prostate gland. Like external beam radiation, brachytherapy destroys cancerous cells. An advantage is that this type of radiation limits the damage to surrounding tissue and has fewer side effects. Chemotherapy. This treatment kills cancer cells or stops them from multiplying. It kills both cancer cells and normal cells. Targeted therapy. This treatment uses medicines to kill cancer cells without damaging normal cells. Hormone treatment. This treatment involves taking medicines that act on testosterone, one of the male hormones, by: Stopping your body from producing testosterone. Blocking testosterone from reaching cancer cells. Follow these instructions at home: Lifestyle Do not use any products that contain nicotine or tobacco. These products include cigarettes, chewing tobacco, and vaping devices, such as e-cigarettes. If you need help quitting, ask your health care provider. Eat a healthy diet. To do this: Eat foods that are high in fiber. These include beans, whole grains, and fresh fruits and vegetables. Limit  foods that are high in fat and sugar. These include fried or sweet foods. Treatment for prostate cancer may affect sexual function. If you have a partner, continue to have intimate moments. This may include touching, holding, hugging, and caressing your partner. Get plenty of sleep. Consider joining a support group for men who have prostate cancer. Meeting with a support group may help you learn to manage the stress of having cancer. General instructions Take over-the-counter and prescription medicines only as told by your health care provider. If you have to go to the hospital, notify your cancer specialist (oncologist). Keep all follow-up visits. This is important. Where to find more information American Cancer Society: www.cancer.[place] [employer]: www.cancer.net [employer]: www.cancer.gov Contact a health care provider if: You have new or increasing trouble urinating. You have new or increasing blood in your urine. You have new or increasing pain in your hips, back, or chest. Get help right away if: You have weakness or numbness in your legs. You cannot control urination or your bowel movements (incontinence). You have chills or a fever. Summary The prostate is a small gland that is involved in the production of semen. It is located below a man's bladder, in front of the rectum. Prostate cancer is the abnormal growth of cells in the prostate gland. Treatment for this condition depends on the stage of the cancer, your age, personal preferences, and your overall health. Talk with your health care provider about  treatment options that are recommended for you. Consider joining a support group for men who have prostate cancer. Meeting with a support group may help you learn to manage the stress of having cancer. This information is not intended to replace advice given to you by your health care provider. Make sure you discuss any questions you have with  your health care provider. Document Revised: 08/19/2020 Document Reviewed: 08/19/2020 Elsevier Patient Education  2024 [employer].

## 2023-01-09 ENCOUNTER — Encounter: Payer: Self-pay | Admitting: Urology

## 2023-01-09 DIAGNOSIS — C61 Malignant neoplasm of prostate: Secondary | ICD-10-CM

## 2023-01-12 ENCOUNTER — Inpatient Hospital Stay (HOSPITAL_COMMUNITY): Admission: RE | Admit: 2023-01-12 | Payer: Medicare Other | Source: Ambulatory Visit

## 2023-01-24 ENCOUNTER — Emergency Department (HOSPITAL_COMMUNITY): Payer: Medicare Other

## 2023-01-24 ENCOUNTER — Emergency Department (HOSPITAL_COMMUNITY)
Admission: EM | Admit: 2023-01-24 | Discharge: 2023-01-25 | Disposition: A | Discharge status: Home / Self Care | Payer: Medicare Other | Attending: Emergency Medicine | Admitting: Emergency Medicine

## 2023-01-24 ENCOUNTER — Encounter (HOSPITAL_COMMUNITY): Payer: Self-pay

## 2023-01-24 DIAGNOSIS — Z23 Encounter for immunization: Secondary | ICD-10-CM | POA: Diagnosis not present

## 2023-01-24 DIAGNOSIS — I2510 Atherosclerotic heart disease of native coronary artery without angina pectoris: Secondary | ICD-10-CM | POA: Insufficient documentation

## 2023-01-24 DIAGNOSIS — Z8616 Personal history of COVID-19: Secondary | ICD-10-CM | POA: Diagnosis not present

## 2023-01-24 DIAGNOSIS — S61531A Puncture wound without foreign body of right wrist, initial encounter: Secondary | ICD-10-CM | POA: Diagnosis not present

## 2023-01-24 DIAGNOSIS — Z203 Contact with and (suspected) exposure to rabies: Secondary | ICD-10-CM | POA: Insufficient documentation

## 2023-01-24 DIAGNOSIS — Z79899 Other long term (current) drug therapy: Secondary | ICD-10-CM | POA: Insufficient documentation

## 2023-01-24 DIAGNOSIS — I10 Essential (primary) hypertension: Secondary | ICD-10-CM | POA: Insufficient documentation

## 2023-01-24 DIAGNOSIS — S51851A Open bite of right forearm, initial encounter: Secondary | ICD-10-CM | POA: Diagnosis present

## 2023-01-24 DIAGNOSIS — Z7982 Long term (current) use of aspirin: Secondary | ICD-10-CM | POA: Diagnosis not present

## 2023-01-24 DIAGNOSIS — W540XXA Bitten by dog, initial encounter: Secondary | ICD-10-CM | POA: Diagnosis not present

## 2023-01-24 MED ORDER — RABIES IMMUNE GLOBULIN 150 UNIT/ML IM INJ
20.0000 [IU]/kg | INJECTION | Freq: Once | INTRAMUSCULAR | Status: AC
  Administered 2023-01-25: 1500 [IU]
  Filled 2023-01-24: qty 10

## 2023-01-24 MED ORDER — RABIES VACCINE, PCEC IM SUSR
1.0000 mL | Freq: Once | INTRAMUSCULAR | Status: AC
  Administered 2023-01-25: 1 mL via INTRAMUSCULAR
  Filled 2023-01-24: qty 1

## 2023-01-24 NOTE — ED Provider Triage Note (Addendum)
Emergency Medicine Provider Triage Evaluation Note  Ricky Chavez , a 67 y.o. male  was evaluated in triage.  Pt complains of dog bite x today. Small puncture wound to the wrist, tetanus in 2017. No records of prior vaccination of the dog's.  Review of Systems  Positive: wound Negative: fever  Physical Exam  BP (!) 141/79 (BP Location: Left Arm)   Pulse 61   Temp 97.6 F (36.4 C) (Oral)   Resp 17   SpO2 99%  Gen:   Awake, no distress   Resp:  Normal effort  MSK:   Moves extremities without difficulty  Other:  3 puncture wounds noted, neurovascularly intact.   Medical Decision Making  Medically screening exam initiated at 6:28 PM.  Appropriate orders placed.  Ricky Chavez was informed that the remainder of the evaluation will be completed by another provider, this initial triage assessment does not replace that evaluation, and the importance of remaining in the ED until their evaluation is complete.   Encourage patient to call poison control, as we do not have any records of rabies.    Claude Manges, PA-C 01/24/23 1833    Claude Manges, PA-C 01/24/23 1834

## 2023-01-24 NOTE — ED Notes (Signed)
Assumed care of pt who presents ambulatory to room with steady gait. Pt reports he was bit by neighbors dog and they can not provide documentation of dogs vaccination records. Pt has multiple bites from wrist to elbow on right arm. Patient reports last tdap 2017. Pt a/o x 4 respirations even and non labored. Bite areas are not currently bleeding however there is some swelling and redness to area. Bites occurred at 1600 this evening

## 2023-01-24 NOTE — ED Triage Notes (Signed)
Pt is coming is for animal bite from a neighbors dog that has multiple abrasions to the right arm that has controlled bleeding, and a small puncture wound located on the right wrist. Pt went home and washed the sites of concern. Has a recent Tdap. Animal has not been contacted prior to triage. Rabies vaccination was unable to be produced by the neighbors.

## 2023-01-24 NOTE — ED Provider Notes (Signed)
[place] EMERGENCY DEPARTMENT AT [facility] Provider Note   CSN: [id number] Arrival date & time: 01/24/23  1801     History {Add pertinent medical, surgical, social history, OB history to HPI:1} Chief Complaint  Patient presents with   Animal Bite    Ricky Chavez is a 67 y.o. male.  HPI     This is a 67 year old male who presents with a dog bite.  Patient reports that he was bitten on the right arm by a neighbors dog around 4:30 PM.  He reports his tetanus is up-to-date.  Vaccine status of the dog is unknown.  States that the bite was unprovoked.  Patient is on blood thinners.  Has not noted any significant ongoing bleeding.  Home Medications Prior to Admission medications   Medication Sig Start Date End Date Taking? Authorizing Provider  aspirin EC 81 MG tablet Take 81 mg by mouth daily.    [provider]  atorvastatin (LIPITOR) 40 MG tablet Take 1 tablet (40 mg total) by mouth daily. 11/23/22   Carlos Levering, NP  carvedilol (COREG) 6.25 MG tablet Take 1 tablet (6.25 mg total) by mouth 2 (two) times daily. 11/23/22   Carlos Levering, NP  lisinopril (ZESTRIL) 5 MG tablet Take 1 tablet (5 mg total) by mouth daily. 11/23/22   Carlos Levering, NP  melatonin 5 MG TABS Take 5 mg by mouth at bedtime.    [provider]  Multiple Vitamins-Minerals (MULTIVITAMIN ADULT PO) Take 1 capsule by mouth daily.    [provider]  nitroGLYCERIN (NITROLINGUAL) 0.4 MG/SPRAY spray Place 1 spray under the tongue every 5 (five) minutes x 3 doses as needed for chest pain. 11/23/22   Carlos Levering, NP  omeprazole (PRILOSEC) 20 MG capsule Take 20 mg by mouth daily.    [provider]  ticagrelor (BRILINTA) 60 MG TABS tablet Take 1 tablet (60 mg total) by mouth 2 (two) times daily. 11/23/22   Carlos Levering, NP      Allergies    Cephalexin    Review of Systems   Review of Systems  Constitutional:  Negative for fever.  Skin:   Positive for wound.  All other systems reviewed and are negative.   Physical Exam Updated Vital Signs BP 126/81   Pulse (!) 54   Temp (!) 97.4 F (36.3 C) (Oral)   Resp 16   SpO2 97%  Physical Exam Vitals and nursing note reviewed.  Constitutional:      Appearance: He is well-developed. He is not ill-appearing.  HENT:     Head: Normocephalic and atraumatic.  Eyes:     Pupils: Pupils are equal, round, and reactive to light.  Cardiovascular:     Rate and Rhythm: Normal rate and regular rhythm.  Pulmonary:     Effort: Pulmonary effort is normal. No respiratory distress.  Abdominal:     Palpations: Abdomen is soft.  Musculoskeletal:        General: No swelling.     Cervical back: Neck supple.  Lymphadenopathy:     Cervical: No cervical adenopathy.  Skin:    General: Skin is warm and dry.     Comments: Multiple lacerations/abrasions to the posterior aspect of the right forearm, bleeding controlled, puncture wound noted over the anterior aspect of the right wrist, no adjacent erythema  Neurological:     Mental Status: He is alert and oriented to person, place, and time.  Psychiatric:        Mood and Affect:  Mood normal.     ED Results / Procedures / Treatments   Labs (all labs ordered are listed, but only abnormal results are displayed) Labs Reviewed - No data to display  EKG None  Radiology DG Wrist Complete Right  Result Date: 01/24/2023 CLINICAL DATA:  Dog bite. EXAM: RIGHT WRIST - COMPLETE 3+ VIEW COMPARISON:  None Available. FINDINGS: There is no evidence of fracture or dislocation. There is no evidence of arthropathy or other focal bone abnormality. There is soft tissue swelling of the lateral wrist. There is soft tissue swelling and air of the there some medial distal forearm. There is no radiopaque foreign body. IMPRESSION: 1. Soft tissue swelling and air of the medial distal forearm and lateral wrist. No radiopaque foreign body. 2. No fracture or malalignment.  Electronically Signed   By: Darliss Cheney M.D.   On: 01/24/2023 19:28    Procedures Procedures  {Document cardiac monitor, telemetry assessment procedure when appropriate:1}  Medications Ordered in ED Medications  rabies vaccine (RABAVERT) injection 1 mL (has no administration in time range)  rabies immune globulin (HYPERRAB/KEDRAB) injection 20 Units/kg (has no administration in time range)    ED Course/ Medical Decision Making/ A&P   {   Click here for ABCD2, HEART and other calculatorsREFRESH Note before signing :1}                              Medical Decision Making Amount and/or Complexity of Data Reviewed Radiology: ordered.  Risk Prescription drug management.   ***  {Document critical care time when appropriate:1} {Document review of labs and clinical decision tools ie heart score, Chads2Vasc2 etc:1}  {Document your independent review of radiology images, and any outside records:1} {Document your discussion with family members, caretakers, and with consultants:1} {Document social determinants of health affecting pt's care:1} {Document your decision making why or why not admission, treatments were needed:1} Final Clinical Impression(s) / ED Diagnoses Final diagnoses:  None    Rx / DC Orders ED Discharge Orders     None

## 2023-01-25 DIAGNOSIS — S51851A Open bite of right forearm, initial encounter: Secondary | ICD-10-CM | POA: Diagnosis not present

## 2023-01-25 MED ORDER — AMOXICILLIN-POT CLAVULANATE 875-125 MG PO TABS: 1.0000 | ORAL_TABLET | Freq: Two times a day (BID) | ORAL | 0 refills | Status: DC

## 2023-01-25 MED ORDER — AMOXICILLIN-POT CLAVULANATE 875-125 MG PO TABS
1.0000 | ORAL_TABLET | Freq: Once | ORAL | Status: AC
  Administered 2023-01-25: 1 via ORAL
  Filled 2023-01-25: qty 1

## 2023-01-25 NOTE — ED Notes (Signed)
Patient given vaccine and bite sites infiltrated with imuglobbin as directed then cleaned and dressed with bacitracin and non stick tefla dressing. Pt verbalized discharge and follow up instructions

## 2023-01-25 NOTE — Discharge Instructions (Signed)
You were seen today for a dog bite.  You will be given antibiotics.  Monitor for signs and symptoms of infection.  Rabies schedule for vaccination was provided.  Follow-up urgent care for subsequent rabies vaccinations.

## 2023-01-26 ENCOUNTER — Ambulatory Visit (HOSPITAL_COMMUNITY)
Admission: RE | Admit: 2023-01-26 | Discharge: 2023-01-26 | Disposition: A | Discharge status: Home / Self Care | Payer: Medicare Other | Source: Ambulatory Visit | Attending: Urology | Admitting: Urology

## 2023-01-26 ENCOUNTER — Ambulatory Visit (HOSPITAL_COMMUNITY): Admission: RE | Admit: 2023-01-26 | Payer: Medicare Other | Source: Ambulatory Visit

## 2023-01-26 DIAGNOSIS — C61 Malignant neoplasm of prostate: Secondary | ICD-10-CM | POA: Diagnosis present

## 2023-01-26 NOTE — Progress Notes (Signed)
GU Location of Tumor / Histology:  Prostate Cancer  If Prostate Cancer, Gleason Score is (5 + 4) and PSA is (13.8 on 11/16/2022)  PSA 13.1 on 09/21/2022 PSA   6.3 on 07/16/2021 PSA   8.7 on 01/26/2022 PSA   5.2 on 07/13/2020  Biopsies      01/26/2023 Dr. Wilkie Aye NM PET (PSMA) Skull to Mid Thigh CLINICAL DATA: Prostate cancer. High risk screening.   IMPRESSION: 1. Focal radiotracer activity in the peripheral zone of the prostate gland consistent with primary prostate adenocarcinoma. 2. No evidence of metastatic adenopathy in the pelvis or periaortic retroperitoneum. 3. No evidence of visceral metastasis or skeletal metastasis.    Past/Anticipated interventions by urology, if any:   Dr. Wilkie Aye 01/04/2023    Past/Anticipated interventions by medical oncology, if any:   NA  Weight changes, if any: No  IPSS:  15 SHIM:  11  Bowel/Bladder complaints, if any:  No  Nausea/Vomiting, if any: No  Pain issues, if any:  0/10  SAFETY ISSUES: Prior radiation? No Pacemaker/ICD? No Possible current pregnancy? Male Is the patient on methotrexate? No  Current Complaints / other details:

## 2023-01-27 ENCOUNTER — Encounter: Payer: Self-pay | Admitting: Urology

## 2023-01-27 ENCOUNTER — Encounter (HOSPITAL_COMMUNITY): Payer: Self-pay

## 2023-01-27 ENCOUNTER — Ambulatory Visit (HOSPITAL_COMMUNITY)
Admission: RE | Admit: 2023-01-27 | Discharge: 2023-01-27 | Disposition: A | Discharge status: Home / Self Care | Payer: Medicare Other | Source: Ambulatory Visit

## 2023-01-27 DIAGNOSIS — Z203 Contact with and (suspected) exposure to rabies: Secondary | ICD-10-CM | POA: Diagnosis not present

## 2023-01-27 MED ORDER — FLOTUFOLASTAT F 18 GALLIUM 296-5846 MBQ/ML IV SOLN
8.0000 | Freq: Once | INTRAVENOUS | Status: AC
  Administered 2023-01-26: 8.421 via INTRAVENOUS
  Filled 2023-01-27: qty 8

## 2023-01-27 MED ORDER — RABIES VACCINE, PCEC IM SUSR
INTRAMUSCULAR | Status: AC
  Filled 2023-01-27: qty 1

## 2023-01-27 MED ORDER — RABIES VACCINE, PCEC IM SUSR
1.0000 mL | Freq: Once | INTRAMUSCULAR | Status: AC
  Administered 2023-01-27: 1 mL via INTRAMUSCULAR

## 2023-01-27 NOTE — ED Triage Notes (Signed)
Patient presents for 2nd rabies vaccine in right deltoid.

## 2023-01-27 NOTE — ED Notes (Signed)
Patient present for 2nd rabies vaccine in right deltoid. Information was given to patient on vaccines.

## 2023-01-29 ENCOUNTER — Encounter: Payer: Self-pay | Admitting: Cardiology

## 2023-01-30 NOTE — Progress Notes (Signed)
Radiation Oncology         [phone] ________________________________  Initial Outpatient Consultation  Name: Ricky Chavez MRN: [id number]  Date: 01/31/2023  DOB: Jan 01, 1956  CC:McGowen, Maryjean Morn, MD  McKenzie, Mardene Celeste, MD   REFERRING PHYSICIAN: Malen Gauze, MD  DIAGNOSIS: 67 y.o. gentleman with Stage T1c adenocarcinoma of the prostate with Gleason score of 5+4, and PSA of 13.8.    ICD-10-CM   1. Malignant neoplasm of prostate (HCC)  C61       HISTORY OF PRESENT ILLNESS: Ricky Chavez is a 67 y.o. male with a diagnosis of prostate cancer. He has been followed by Dr. Ronne Binning  at [facility] since at least 2017 for BPH with BOO and elevated PSA ranging between 9 and 11. He had also previously undergone three prostate biopsies prior to 2017, all of which were negative and had been on alpha-blockers and 5-ARI therapy intermittently since 2009.  His PSA initially responded well to finasteride, decreasing to 6.03 in 03/2018 and remaining in the 5-6 range.  Due to significant refractory bladder outlet obstructive symptoms he had a UroLift procedure in May 2023.  Initially, following the procedure, he had a significant improvement in flow of stream and bladder emptying and was able to discontinue the finasteride but this was short-lived.  His PSA increased to 13.1 in 09/2022 and remained elevated at 13.8 when repeated in June 2024. The patient proceeded to transrectal ultrasound with 12 biopsies of the prostate on 12/21/22.  The prostate volume measured 94.6 cc.  Out of 12 core biopsies, 4 were positive.  The maximum Gleason score was 5+4, and this was seen in the left base lateral, left mid lateral (with perineural invasion), and left base (with PNI). Additionally, Gleason 4+5 was seen in the left mid.  He underwent staging PSMA PET scan on 01/26/23 showing no evidence of disease outside of the prostate.  The patient reviewed the biopsy results with his urologist and met with  Dr. Berneice Heinrich on 01/30/2023 to discuss his surgical options.  He was also interested in learning about radiotherapy so he has kindly been referred today for discussion of potential radiation treatment options.   PREVIOUS RADIATION THERAPY: No  PAST MEDICAL HISTORY:  Past Medical History:  Diagnosis Date   Allergic rhinitis    BPH with obstruction/lower urinary tract symptoms    flomax and finasteride qod per urol   Coronary artery disease    With preserved EF.  Needs DAPT lifetime due to high risk status.   COVID 09/01/2020   Elevated PSA onset @ 2009   Neg bx x 3.  MRI c/w bph only.  PSA inc 2024-->rpt bx (results pending as of 12/22/22)   GERD (gastroesophageal reflux disease)    Hearing loss of both ears    History of meniscal tear 2023   L knee   History of myocardial infarction 2011   Hyperlipidemia    Hypertension    Insomnia    Myocardial infarction [facility])    Organic impotence    Palpitations 05/2018   PVCs   Sciatica    Piriformis syndrome likely--improving with gabapentin as of 04/2017.  May need MRI L spine and/or emg/NCS   Squamous cell carcinoma       PAST SURGICAL HISTORY: Past Surgical History:  Procedure Laterality Date   CARDIAC CATHETERIZATION  01/31/2013   Stents to LAD patent; small diagonal stent with 90% ostial stenosis--good overall LV function with mild anterior and focal inferoapical hypokinesis, EF  60-65%.  Unsuccessful angioplasty of diagonal.   CARDIOVASCULAR STRESS TEST  05/26/2015   stress echo neg for ischemia   CARDIOVASCULAR STRESS TEST  10/04/2017   Myoc perf imaging: normal (EF 59%)   COLONOSCOPY  2008; 06/14/16   2008 Normal.  2018 hyperplastic polyp +internal hemorrhoids.  Recall 2028.   CYSTOSCOPY WITH INSERTION OF UROLIFT N/A 10/04/2021   Procedure: CYSTOSCOPY WITH INSERTION OF UROLIFT;  Surgeon: Malen Gauze, MD;  Location: [facility];  Service: Urology;  Laterality: N/A;   HYDROCELE EXCISION / REPAIR  early 2000's   INGUINAL HERNIA  REPAIR Bilateral 1998   PROSTATE BIOPSY     Multiple; all neg.  Prostate MRI 2018 -NO SIGN OF PROSTATE CA.Marland Kitchen Rpt bx 12/2022->results pending as of 12/22/22   PTCA     Cath 2011---4 stents--LAD and diagonal.  2014 repeat cath for CP showed no changes.   Scrotal u/s  01/2015   numerous extratesticular cystic lesions bilat, c/w numerous epididymal cysts vs loculated hydrocele--following expectantly.    FAMILY HISTORY:  Family History  Problem Relation Age of Onset   Hyperlipidemia Mother    Hypertension Mother    Heart disease Mother 4       CABG   Other Mother        Neuroendocrine carcinoma pancreas (solid mass) and liver (diffuse)   Heart disease Father 55       CABG   Hyperlipidemia Father    Hypertension Father    Colon polyps Father    Hypothyroidism Sister    Heart attack Brother 37       MI   Prostate cancer Paternal Uncle    Peripheral vascular disease Brother        Carotid    Stroke Neg Hx    Diabetes Neg Hx     SOCIAL HISTORY:  Social History   Socioeconomic History   Marital status: Married    Spouse name: Not on file   Number of children: 2   Years of education: Not on file   Highest education level: Master's degree (e.g., MA, MS, MEng, MEd, MSW, MBA)  Occupational History   Occupation: retired  Tobacco Use   Smoking status: Former    Current packs/day: 0.00    Average packs/day: 0.3 packs/day for 2.0 years (0.5 ttl pk-yrs)    Types: Cigarettes    Start date: 06/06/1976    Quit date: 06/06/1978    Years since quitting: 44.6   Smokeless tobacco: Never  Vaping Use   Vaping status: Never Used  Substance and Sexual Activity   Alcohol use: Yes    Alcohol/week: 14.0 standard drinks of alcohol    Types: 14 Glasses of wine per week   Drug use: No   Sexual activity: Yes  Other Topics Concern   Not on file  Social History Narrative   Married, 2 grown children.   Relocated to [place] from [facility] 2016.   Educ: Masters degree   Occup: Retired Emergency planning/management officer  with [employer]   No tob.   Alc: 1 glass wine/night.   Social Determinants of Health   Financial Resource Strain: Low Risk  (06/21/2022)   Overall Financial Resource Strain (CARDIA)    Difficulty of Paying Living Expenses: Not hard at all  Food Insecurity: No Food Insecurity (01/31/2023)   Hunger Vital Sign    Worried About Running Out of Food in the Last Year: Never true    Ran Out of Food in the Last Year: Never true  Transportation Needs: No Transportation Needs (01/31/2023)   PRAPARE - Administrator, Civil Service (Medical): No    Lack of Transportation (Non-Medical): No  Physical Activity: Insufficiently Active (06/21/2022)   Exercise Vital Sign    Days of Exercise per Week: 2 days    Minutes of Exercise per Session: 60 min  Stress: No Stress Concern Present (06/21/2022)   [employer] of Occupational Health - Occupational Stress Questionnaire    Feeling of Stress : Not at all  Social Connections: Socially Integrated (06/21/2022)   Social Connection and Isolation Panel [NHANES]    Frequency of Communication with Friends and Family: More than three times a week    Frequency of Social Gatherings with Friends and Family: More than three times a week    Attends Religious Services: More than 4 times per year    Active Member of [employer] or Organizations: Yes    Attends Engineer, structural: More than 4 times per year    Marital Status: Married  Catering manager Violence: Not At Risk (01/31/2023)   Humiliation, Afraid, Rape, and Kick questionnaire    Fear of Current or Ex-Partner: No    Emotionally Abused: No    Physically Abused: No    Sexually Abused: No    ALLERGIES: Cephalexin  MEDICATIONS:  Current Outpatient Medications  Medication Sig Dispense Refill   amoxicillin-clavulanate (AUGMENTIN) 875-125 MG tablet Take 1 tablet by mouth every 12 (twelve) hours. 14 tablet 0   aspirin EC 81 MG tablet Take 81 mg by mouth daily.     atorvastatin (LIPITOR)  40 MG tablet Take 1 tablet (40 mg total) by mouth daily. 90 tablet 3   carvedilol (COREG) 6.25 MG tablet Take 1 tablet (6.25 mg total) by mouth 2 (two) times daily. 180 tablet 3   lisinopril (ZESTRIL) 5 MG tablet Take 1 tablet (5 mg total) by mouth daily. 90 tablet 3   melatonin 5 MG TABS Take 5 mg by mouth at bedtime.     Multiple Vitamins-Minerals (MULTIVITAMIN ADULT PO) Take 1 capsule by mouth daily.     nitroGLYCERIN (NITROLINGUAL) 0.4 MG/SPRAY spray Place 1 spray under the tongue every 5 (five) minutes x 3 doses as needed for chest pain. 12 g 3   omeprazole (PRILOSEC) 20 MG capsule Take 20 mg by mouth daily.     ticagrelor (BRILINTA) 60 MG TABS tablet Take 1 tablet (60 mg total) by mouth 2 (two) times daily. 180 tablet 3   No current facility-administered medications for this encounter.    REVIEW OF SYSTEMS:  On review of systems, the patient reports that he is doing well overall. He denies any chest pain, shortness of breath, cough, fevers, chills, night sweats, unintended weight changes. He denies any bowel disturbances, and denies abdominal pain, nausea or vomiting. He denies any new musculoskeletal or joint aches or pains. His IPSS was 15, indicating moderate urinary symptoms having previously failed medical therapy with Flomax, Avodart and a UroLift procedure in May 2023. His SHIM was 11, indicating he has moderate erectile dysfunction. A complete review of systems is obtained and is otherwise negative.    PHYSICAL EXAM:  Wt Readings from Last 3 Encounters:  01/31/23 167 lb 6.4 oz (75.9 kg)  01/24/23 165 lb (74.8 kg)  12/22/22 163 lb (73.9 kg)   Temp Readings from Last 3 Encounters:  01/31/23 97.8 F (36.6 C)  01/25/23 97.6 F (36.4 C) (Oral)  12/21/22 98 F (36.7 C) (Oral)   BP Readings  from Last 3 Encounters:  01/31/23 117/66  01/25/23 127/73  01/04/23 (!) 158/82   Pulse Readings from Last 3 Encounters:  01/31/23 (!) 52  01/25/23 (!) 54  01/04/23 (!) 49   Pain  Assessment Pain Score: 0-No pain/10  In general this is a well appearing Caucasian male in no acute distress. He's alert and oriented x4 and appropriate throughout the examination. Cardiopulmonary assessment is negative for acute distress, and he exhibits normal effort.     KPS = 100  100 - Normal; no complaints; no evidence of disease. 90   - Able to carry on normal activity; minor signs or symptoms of disease. 80   - Normal activity with effort; some signs or symptoms of disease. 49   - Cares for self; unable to carry on normal activity or to do active work. 60   - Requires occasional assistance, but is able to care for most of his personal needs. 50   - Requires considerable assistance and frequent medical care. 40   - Disabled; requires special care and assistance. 30   - Severely disabled; hospital admission is indicated although death not imminent. 20   - Very sick; hospital admission necessary; active supportive treatment necessary. 10   - Moribund; fatal processes progressing rapidly. 0     - Dead  Karnofsky DA, Abelmann WH, Craver LS and Burchenal [facility] [phone]) The use of the nitrogen mustards in the palliative treatment of carcinoma: with particular reference to bronchogenic carcinoma Cancer 1 634-56  LABORATORY DATA:  Lab Results  Component Value Date   WBC 7.2 12/22/2022   HGB 14.3 12/22/2022   HCT 43.3 12/22/2022   MCV 92.7 12/22/2022   PLT 199.0 12/22/2022   Lab Results  Component Value Date   NA 139 12/22/2022   K 4.8 12/22/2022   CL 102 12/22/2022   CO2 30 12/22/2022   Lab Results  Component Value Date   ALT 24 12/22/2022   AST 24 12/22/2022   ALKPHOS 63 12/22/2022   BILITOT 1.0 12/22/2022     RADIOGRAPHY: NM PET (PSMA) SKULL TO MID THIGH  Result Date: 01/27/2023 CLINICAL DATA:  Prostate cancer.  High risk screening. EXAM: NUCLEAR MEDICINE PET SKULL BASE TO THIGH TECHNIQUE: 8.4 mCi F18 [place]) was injected intravenously. Full-ring PET imaging was performed  from the skull base to thigh after the radiotracer. CT data was obtained and used for attenuation correction and anatomic localization. COMPARISON:  None Available. FINDINGS: NECK No radiotracer activity in neck lymph nodes. a Incidental CT finding: None. CHEST No radiotracer accumulation within mediastinal or hilar lymph nodes. No suspicious pulmonary nodules on the CT scan. Incidental CT finding: None. ABDOMEN/PELVIS Prostate: Focal intense radiotracer activity posterior midline in the peripheral zone of the prostate gland (image 173) Lymph nodes: No abnormal radiotracer accumulation within pelvic or abdominal nodes. Liver: No evidence of liver metastasis. Incidental CT finding: Atherosclerotic calcification of the aorta. SKELETON No focal activity to suggest skeletal metastasis. IMPRESSION: 1. Focal radiotracer activity in the peripheral zone of the prostate gland consistent with primary prostate adenocarcinoma. 2. No evidence of metastatic adenopathy in the pelvis or periaortic retroperitoneum. 3. No evidence of visceral metastasis or skeletal metastasis. Electronically Signed   By: Genevive Bi M.D.   On: 01/27/2023 16:47   DG Wrist Complete Right  Result Date: 01/24/2023 CLINICAL DATA:  Dog bite. EXAM: RIGHT WRIST - COMPLETE 3+ VIEW COMPARISON:  None Available. FINDINGS: There is no evidence of fracture or dislocation. There is no evidence  of arthropathy or other focal bone abnormality. There is soft tissue swelling of the lateral wrist. There is soft tissue swelling and air of the there some medial distal forearm. There is no radiopaque foreign body. IMPRESSION: 1. Soft tissue swelling and air of the medial distal forearm and lateral wrist. No radiopaque foreign body. 2. No fracture or malalignment. Electronically Signed   By: Darliss Cheney M.D.   On: 01/24/2023 19:28      IMPRESSION/PLAN: 1. 67 y.o. gentleman with Stage T1c adenocarcinoma of the prostate with Gleason Score of 5+4, and PSA of  13.8. We discussed the patient's workup and outlined the nature of prostate cancer in this setting. The patient's T stage, Gleason's score, and PSA put him into the high risk group. Accordingly, he is eligible for a variety of potential treatment options including prostatectomy or LT-ADT concurrent with either 8 weeks of external radiation or 5 weeks of external radiation with an upfront brachytherapy boost. We discussed the available radiation techniques, and focused on the details and logistics of delivery. The patient is not an ideal candidate for brachytherapy boost with a prostate volume of 94.6 cc and obstructive symptoms that have been refractory to medical therapy.  Therefore, we discussed and outlined the risks, benefits, short and long-term effects associated with external beam radiotherapy and compared and contrasted these with prostatectomy. We discussed the role of SpaceOAR gel in reducing the rectal toxicity associated with radiotherapy. We also detailed the role of ADT in the treatment of high risk prostate cancer and outlined the associated side effects that could be expected with this therapy. He appears to have a good understanding of his disease and our treatment recommendations which are of curative intent.  We discussed the rationale regarding the intentional delay in starting the daily radiation treatments for approximately 2 months after starting ADT, to allow for the radiosensitizing effects of this therapy.    The patient focused most of his questions and interest in robotic-assisted laparoscopic radical prostatectomy.  We discussed some of the potential advantages of surgery including surgical staging, the availability of salvage radiotherapy to the prostatic fossa, and the confidence associated with immediate biochemical response.  We discussed some of the potential proven indications for postoperative radiotherapy including positive margins, extracapsular extension, and seminal  vesicle involvement. We also talked about some of the other potential findings leading to a recommendation for radiotherapy including a non-zero postoperative PSA and positive lymph nodes. He was encouraged to ask questions that were answered to his stated satisfaction.  At the conclusion of our conversation, the patient is leaning towards RALP, but is still considering 8 weeks IMRT concurrent with LT-ADT. He met with Dr. Berneice Heinrich on 01/30/23, to discuss his surgical treatment options and is scheduled for a second opinion with Dr. Carolin Sicks at [facility] on 02/13/23. He has a follow up with Dr. Ronne Binning that same day, and he is hoping to have reached a decision at that time.  He has our contact information and knows that he is welcome to call at anytime with any further questions or concerns related to the treatment options discussed today.  We enjoyed meeting him today and look forward to continuing to participate in his care should he ultimately elect to proceed with daily external beam radiation in [place].  We personally spent 70 minutes in this encounter including chart review, reviewing radiological studies, meeting face-to-face with the patient, entering orders and completing documentation.    Marguarite Arbour, PA-C    Margaretmary Dys,  MD  [facility]  Radiation Oncology Direct Dial: [phone]  Fax: [phone] [email or web address]  Skype  [email or web address]   This document serves as a record of services personally performed by Margaretmary Dys, MD. It was created on his behalf by Mickie Bail, a trained medical scribe. The creation of this record is based on the scribe's personal observations and the provider's statements to them. This document has been checked and approved by the attending provider.

## 2023-01-31 ENCOUNTER — Ambulatory Visit
Admission: RE | Admit: 2023-01-31 | Discharge: 2023-01-31 | Disposition: A | Discharge status: Home / Self Care | Payer: Medicare Other | Source: Ambulatory Visit | Attending: Radiation Oncology | Admitting: Radiation Oncology

## 2023-01-31 ENCOUNTER — Encounter: Payer: Self-pay | Admitting: Radiation Oncology

## 2023-01-31 ENCOUNTER — Encounter: Payer: Self-pay | Admitting: Urology

## 2023-01-31 ENCOUNTER — Ambulatory Visit (HOSPITAL_COMMUNITY)
Admission: EM | Admit: 2023-01-31 | Discharge: 2023-01-31 | Disposition: A | Discharge status: Home / Self Care | Payer: Medicare Other | Attending: Internal Medicine | Admitting: Internal Medicine

## 2023-01-31 VITALS — BP 117/66 | HR 52 | Temp 97.8°F | Resp 18 | Ht 68.0 in | Wt 167.4 lb

## 2023-01-31 DIAGNOSIS — Z87891 Personal history of nicotine dependence: Secondary | ICD-10-CM | POA: Diagnosis not present

## 2023-01-31 DIAGNOSIS — I739 Peripheral vascular disease, unspecified: Secondary | ICD-10-CM | POA: Diagnosis not present

## 2023-01-31 DIAGNOSIS — M7989 Other specified soft tissue disorders: Secondary | ICD-10-CM | POA: Insufficient documentation

## 2023-01-31 DIAGNOSIS — I252 Old myocardial infarction: Secondary | ICD-10-CM | POA: Diagnosis not present

## 2023-01-31 DIAGNOSIS — I2510 Atherosclerotic heart disease of native coronary artery without angina pectoris: Secondary | ICD-10-CM | POA: Diagnosis not present

## 2023-01-31 DIAGNOSIS — E039 Hypothyroidism, unspecified: Secondary | ICD-10-CM | POA: Diagnosis not present

## 2023-01-31 DIAGNOSIS — C61 Malignant neoplasm of prostate: Secondary | ICD-10-CM | POA: Diagnosis not present

## 2023-01-31 DIAGNOSIS — I700 Atherosclerosis of aorta: Secondary | ICD-10-CM | POA: Diagnosis not present

## 2023-01-31 DIAGNOSIS — E785 Hyperlipidemia, unspecified: Secondary | ICD-10-CM | POA: Diagnosis not present

## 2023-01-31 DIAGNOSIS — I10 Essential (primary) hypertension: Secondary | ICD-10-CM | POA: Insufficient documentation

## 2023-01-31 DIAGNOSIS — Z203 Contact with and (suspected) exposure to rabies: Secondary | ICD-10-CM

## 2023-01-31 DIAGNOSIS — Z79899 Other long term (current) drug therapy: Secondary | ICD-10-CM | POA: Diagnosis not present

## 2023-01-31 DIAGNOSIS — Z7902 Long term (current) use of antithrombotics/antiplatelets: Secondary | ICD-10-CM | POA: Diagnosis not present

## 2023-01-31 DIAGNOSIS — K219 Gastro-esophageal reflux disease without esophagitis: Secondary | ICD-10-CM | POA: Diagnosis not present

## 2023-01-31 DIAGNOSIS — Z8616 Personal history of COVID-19: Secondary | ICD-10-CM | POA: Diagnosis not present

## 2023-01-31 DIAGNOSIS — Z7982 Long term (current) use of aspirin: Secondary | ICD-10-CM | POA: Insufficient documentation

## 2023-01-31 DIAGNOSIS — M129 Arthropathy, unspecified: Secondary | ICD-10-CM | POA: Diagnosis not present

## 2023-01-31 MED ORDER — RABIES VACCINE, PCEC IM SUSR
1.0000 mL | Freq: Once | INTRAMUSCULAR | Status: AC
  Administered 2023-01-31: 1 mL via INTRAMUSCULAR

## 2023-01-31 MED ORDER — RABIES VACCINE, PCEC IM SUSR
INTRAMUSCULAR | Status: AC
  Filled 2023-01-31: qty 1

## 2023-01-31 NOTE — ED Triage Notes (Signed)
Pt here for day 7 rabies injection.

## 2023-02-01 ENCOUNTER — Ambulatory Visit (INDEPENDENT_AMBULATORY_CARE_PROVIDER_SITE_OTHER): Payer: Medicare Other | Admitting: Family Medicine

## 2023-02-01 ENCOUNTER — Encounter: Payer: Self-pay | Admitting: Family Medicine

## 2023-02-01 VITALS — BP 131/82 | HR 55 | Wt 164.8 lb

## 2023-02-01 DIAGNOSIS — S41151D Open bite of right upper arm, subsequent encounter: Secondary | ICD-10-CM

## 2023-02-01 DIAGNOSIS — W540XXD Bitten by dog, subsequent encounter: Secondary | ICD-10-CM | POA: Diagnosis not present

## 2023-02-01 DIAGNOSIS — Z23 Encounter for immunization: Secondary | ICD-10-CM | POA: Diagnosis not present

## 2023-02-01 NOTE — Progress Notes (Signed)
OFFICE VISIT  02/01/2023  CC:  Chief Complaint  Patient presents with   Follow-up    ED f/u. ED visit 8/20- Seen for Dog bites after being attacked by dog; Was given rabies vaccinations the day of, 3 days after, and yesterday. Waiting to see if the dog is experiencing/has rabies before getting the day 14 vax. No stiches/staples. Around one of the wounds, pt states he get an aching sensation all around the wrist. Usually experiences pain at night in bed.      Patient is a 67 y.o. male who presents for emergency department follow-up 01/24/23. Ricky Chavez got attacked by a dog, was bitten on the right arm.  Puncture wounds and abrasions sustained.  The dog had not been acting particularly different or sick.  Vaccine history of the dog was not definitively determined.  In the emergency department he was given rabies immunoglobulin as well as rabies vaccine.  He has subsequently returned to the ED for 2 more rabies vaccine boosters.  He got Augmentin prescribed. He has some mild right wrist discomfort that gets more prominent in his sleep.  He takes Tylenol before bed. No fever, malaise, confusion, headaches, rash, or swelling.   Past Medical History:  Diagnosis Date   Allergic rhinitis    BPH with obstruction/lower urinary tract symptoms    flomax and finasteride qod per urol   Coronary artery disease    With preserved EF.  Needs DAPT lifetime due to high risk status.   COVID 09/01/2020   Elevated PSA onset @ 2009   Neg bx x 3.  MRI c/w bph only.  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   GERD (gastroesophageal reflux disease)    Hearing loss of both ears    History of meniscal tear 2023   L knee   History of myocardial infarction 2011   Hyperlipidemia    Hypertension    Insomnia    Myocardial infarction [facility])    Organic impotence    Palpitations 05/2018   PVCs   Sciatica    Piriformis syndrome likely--improving with gabapentin as of 04/2017.  May need MRI L spine and/or  emg/NCS   Squamous cell carcinoma     Past Surgical History:  Procedure Laterality Date   CARDIAC CATHETERIZATION  01/31/2013   Stents to LAD patent; small diagonal stent with 90% ostial stenosis--good overall LV function with mild anterior and focal inferoapical hypokinesis, EF 60-65%.  Unsuccessful angioplasty of diagonal.   CARDIOVASCULAR STRESS TEST  05/26/2015   stress echo neg for ischemia   CARDIOVASCULAR STRESS TEST  10/04/2017   Myoc perf imaging: normal (EF 59%)   COLONOSCOPY  2008; 06/14/16   2008 Normal.  2018 hyperplastic polyp +internal hemorrhoids.  Recall 2028.   CYSTOSCOPY WITH INSERTION OF UROLIFT N/A 10/04/2021   Procedure: CYSTOSCOPY WITH INSERTION OF UROLIFT;  Surgeon: Malen Gauze, MD;  Location: [facility];  Service: Urology;  Laterality: N/A;   HYDROCELE EXCISION / REPAIR  early 2000's   INGUINAL HERNIA REPAIR Bilateral 1998   PROSTATE BIOPSY     Multiple; all neg.  Prostate MRI 2018 -NO SIGN OF PROSTATE CA.Marland Kitchen Rpt bx 12/2022->results pending as of 12/22/22   PTCA     Cath 2011---4 stents--LAD and diagonal.  2014 repeat cath for CP showed no changes.   Scrotal u/s  01/2015   numerous extratesticular cystic lesions bilat, c/w numerous epididymal cysts vs loculated hydrocele--following expectantly.    Outpatient Medications Prior to Visit  Medication Sig Dispense  Refill   aspirin EC 81 MG tablet Take 81 mg by mouth daily.     atorvastatin (LIPITOR) 40 MG tablet Take 1 tablet (40 mg total) by mouth daily. 90 tablet 3   carvedilol (COREG) 6.25 MG tablet Take 1 tablet (6.25 mg total) by mouth 2 (two) times daily. 180 tablet 3   lisinopril (ZESTRIL) 5 MG tablet Take 1 tablet (5 mg total) by mouth daily. 90 tablet 3   melatonin 5 MG TABS Take 5 mg by mouth at bedtime.     Multiple Vitamins-Minerals (MULTIVITAMIN ADULT PO) Take 1 capsule by mouth daily.     nitroGLYCERIN (NITROLINGUAL) 0.4 MG/SPRAY spray Place 1 spray under the tongue every 5 (five) minutes x 3 doses as  needed for chest pain. 12 g 3   omeprazole (PRILOSEC) 20 MG capsule Take 20 mg by mouth daily.     ticagrelor (BRILINTA) 60 MG TABS tablet Take 1 tablet (60 mg total) by mouth 2 (two) times daily. 180 tablet 3   amoxicillin-clavulanate (AUGMENTIN) 875-125 MG tablet Take 1 tablet by mouth every 12 (twelve) hours. (Patient not taking: Reported on 02/01/2023) 14 tablet 0   No facility-administered medications prior to visit.    Allergies  Allergen Reactions   Cephalexin Other (See Comments)    Cause the patient to develop Cdiff     Review of Systems As per HPI  PE:    02/01/2023   11:28 AM 01/31/2023    8:04 AM 01/25/2023    1:55 AM  Vitals with BMI  Height  5\' 8"    Weight 164 lbs 13 oz 167 lbs 6 oz   BMI 25.06 25.46   Systolic 131 117 409  Diastolic 82 66 73  Pulse 55 52 54     Physical Exam  Gen: Alert, well appearing.  Patient is oriented to person, place, time, and situation. Right forearm and wrist with 2 puncture wounds and 2 abrasions.  These have appropriate scabbing and pink discoloration surrounding them.  No significant tenderness to palpation.  No fluctuance, no streaking, no excessive warmth. Range of motion of the fingers and wrist are normal.  LABS:  Last CBC Lab Results  Component Value Date   WBC 7.2 12/22/2022   HGB 14.3 12/22/2022   HCT 43.3 12/22/2022   MCV 92.7 12/22/2022   MCH 31.0 12/21/2021   RDW 13.4 12/22/2022   PLT 199.0 12/22/2022   Last metabolic panel Lab Results  Component Value Date   GLUCOSE 112 (H) 12/22/2022   NA 139 12/22/2022   K 4.8 12/22/2022   CL 102 12/22/2022   CO2 30 12/22/2022   BUN 16 12/22/2022   CREATININE 0.97 12/22/2022   GFR 81.73 12/22/2022   CALCIUM 10.0 12/22/2022   PROT 6.6 12/22/2022   ALBUMIN 4.3 12/22/2022   BILITOT 1.0 12/22/2022   ALKPHOS 63 12/22/2022   AST 24 12/22/2022   ALT 24 12/22/2022   IMPRESSION AND PLAN:  #1 dog bite to right forearm. He is status post appropriate rabies  immunoglobulin administration as well as rabies vaccine administration. He shows no sign of illness.  His bite wound does not show any sign of infection. His pain is controlled with Tylenol.  #2 prostate adenocarcinoma. Unfortunately Rick's prostate biopsy on 12/21/2022 showed prostate cancer.  Evaluation has revealed no signs of regional or distant metastatic disease. We discussed this some today.  He is understandably going through a lot, especially trying to make decisions about treatment. He will be getting a  couple of second opinions on September 9 and September 12.  An After Visit Summary was printed and given to the patient.  FOLLOW UP: Return if symptoms worsen or fail to improve.  Signed:  Santiago Bumpers, MD           02/01/2023

## 2023-02-01 NOTE — Addendum Note (Signed)
Addended by: Filomena Jungling on: 02/01/2023 03:26 PM   Modules accepted: Orders

## 2023-02-02 ENCOUNTER — Telehealth: Payer: Self-pay

## 2023-02-02 NOTE — Telephone Encounter (Signed)
Records efaxed to [facility] Release: [id number]

## 2023-02-05 NOTE — Progress Notes (Unsigned)
  Cardiology Office Note:   Date:  02/08/2023  ID:  Ricky Chavez, DOB April 23, 1956, MRN [id number] PCP: Jeoffrey Massed, MD  [facility] Providers Cardiologist:  Rollene Rotunda, MD {  History of Present Illness:   Ricky Chavez is a 67 y.o. male who presents for follow up of CAD.  The patient was treated at [facility] for CAD in late 2016.  He had stents to LAD patent; small diagonal stent with 90% ostial stenosis--good overall LV function with mild anterior and focal inferoapical hypokinesis, EF 60-65%.  He had unsuccessful angioplasty of the diagonal.  He had a stress echo in 2016 December that was negative for any evidence of ischemia. The patient had a complicated course with his initial angioplasty.  In 2019 he had chest pain.  He had a low risk perfusion study. At the last visit he was going for urologic procedure.     Since he was last seen he has been diagnosed with prostate cancer and is going to have surgery or seed implants.  The patient denies any new symptoms such as chest discomfort, neck or arm discomfort. There has been no new shortness of breath, PND or orthopnea. There have been no reported palpitations, presyncope or syncope.  He has been exercising routinely.  He goes to the gym a couple times per week.  With this she has had no chest pressure, neck or arm discomfort.  He has no symptoms since his stress test in 2019.   ROS: As stated in the HPI and negative for all other systems.  Studies Reviewed:    EKG:   Sinus bradycardia, no acute ST-T wave changes.  Risk Assessment/Calculations:     Physical Exam:   VS:  BP 108/60 (BP Location: Right Arm, Patient Position: Sitting, Cuff Size: Normal)   Pulse (!) 53   Ht 5\' 9"  (1.753 m)   Wt 166 lb 3.2 oz (75.4 kg)   SpO2 98%   BMI 24.54 kg/m    Wt Readings from Last 3 Encounters:  02/08/23 166 lb 3.2 oz (75.4 kg)  02/01/23 164 lb 12.8 oz (74.8 kg)  01/31/23 167 lb 6.4 oz (75.9 kg)     GEN: Well nourished, well  developed in no acute distress NECK: No JVD; No carotid bruits CARDIAC: RRR, no murmurs, rubs, gallops RESPIRATORY:  Clear to auscultation without rales, wheezing or rhonchi  ABDOMEN: Soft, non-tender, non-distended EXTREMITIES:  No edema; No deformity   ASSESSMENT AND PLAN:   CAD:   The patient has no new sypmtoms.  No further cardiovascular testing is indicated.  We will continue with aggressive risk reduction and meds as listed.   DYSLIPIDEMIA: LDL was 50 with an HDL of 48.  No change in therapy. He gets blood work drawn I would like for him to have an LP(a).  HTN:    Blood pressure is at target.  No change in therapy.  In the system for powerP  PREOP: The patient needs another urologic procedure.  He is not a high risk procedure.  He has a high functional status.  He has no unstable symptoms or signs.  According to ACC/AHA guidelines no further testing is indicated.  He is at acceptable risk for surgery.  He can hold his Eliquis 5 days prior to the procedure if necessary and resume afterward per the direction of the operating physician.  Follow up with me in one year.   Signed, Rollene Rotunda, MD

## 2023-02-08 ENCOUNTER — Encounter: Payer: Self-pay | Admitting: Cardiology

## 2023-02-08 ENCOUNTER — Ambulatory Visit: Payer: Medicare Other | Attending: Cardiology | Admitting: Cardiology

## 2023-02-08 VITALS — BP 108/60 | HR 53 | Ht 69.0 in | Wt 166.2 lb

## 2023-02-08 DIAGNOSIS — I25810 Atherosclerosis of coronary artery bypass graft(s) without angina pectoris: Secondary | ICD-10-CM

## 2023-02-08 DIAGNOSIS — I10 Essential (primary) hypertension: Secondary | ICD-10-CM | POA: Diagnosis present

## 2023-02-08 DIAGNOSIS — E785 Hyperlipidemia, unspecified: Secondary | ICD-10-CM | POA: Diagnosis present

## 2023-02-08 NOTE — Patient Instructions (Signed)
Medication Instructions:  Your physician recommends that you continue on your current medications as directed. Please refer to the Current Medication list given to you today.    *If you need a refill on your cardiac medications before your next appointment, please call your pharmacy*   Lab Work:  Get Lpa drawn at primary care providers office at next visit.      Follow-Up: At [facility], you and your health needs are our priority.  As part of our continuing mission to provide you with exceptional heart care, we have created designated Provider Care Teams.  These Care Teams include your primary Cardiologist (physician) and Advanced Practice Providers (APPs -  Physician Assistants and Nurse Practitioners) who all work together to provide you with the care you need, when you need it.  We recommend signing up for the patient portal called "MyChart".  Sign up information is provided on this After Visit Summary.  MyChart is used to connect with patients for Virtual Visits (Telemedicine).  Patients are able to view lab/test results, encounter notes, upcoming appointments, etc.  Non-urgent messages can be sent to your provider as well.   To learn more about what you can do with MyChart, go to [email or web address].    Your next appointment:   1 year(s)  The format for your next appointment:   In Person  Provider:   Rollene Rotunda, MD

## 2023-02-10 ENCOUNTER — Encounter: Payer: Self-pay | Admitting: Family Medicine

## 2023-02-10 ENCOUNTER — Ambulatory Visit (INDEPENDENT_AMBULATORY_CARE_PROVIDER_SITE_OTHER): Payer: Medicare Other | Admitting: Family Medicine

## 2023-02-10 VITALS — BP 104/62 | HR 55 | Temp 98.7°F | Ht 69.0 in | Wt 166.0 lb

## 2023-02-10 DIAGNOSIS — M545 Low back pain, unspecified: Secondary | ICD-10-CM

## 2023-02-10 MED ORDER — HYDROCODONE-ACETAMINOPHEN 5-325 MG PO TABS: ORAL_TABLET | ORAL | 0 refills | Status: DC

## 2023-02-10 NOTE — Patient Instructions (Signed)
Low Back Sprain or Strain Rehab Ask your health care provider which exercises are safe for you. Do exercises exactly as told by your health care provider and adjust them as directed. It is normal to feel mild stretching, pulling, tightness, or discomfort as you do these exercises. Stop right away if you feel sudden pain or your pain gets worse. Do not begin these exercises until told by your health care provider. Stretching and range-of-motion exercises These exercises warm up your muscles and joints and improve the movement and flexibility of your back. These exercises also help to relieve pain, numbness, and tingling. Lumbar rotation  Lie on your back on a firm bed or the floor with your knees bent. Straighten your arms out to your sides so each arm forms a 90-degree angle (right angle) with a side of your body. Slowly move (rotate) both of your knees to one side of your body until you feel a stretch in your lower back (lumbar). Try not to let your shoulders lift off the floor. Hold this position for __________ seconds. Tense your abdominal muscles and slowly move your knees back to the starting position. Repeat this exercise on the other side of your body. Repeat __________ times. Complete this exercise __________ times a day. Single knee to chest  Lie on your back on a firm bed or the floor with both legs straight. Bend one of your knees. Use your hands to move your knee up toward your chest until you feel a gentle stretch in your lower back and buttock. Hold your leg in this position by holding on to the front of your knee. Keep your other leg as straight as possible. Hold this position for __________ seconds. Slowly return to the starting position. Repeat with your other leg. Repeat __________ times. Complete this exercise __________ times a day. Prone extension on elbows  Lie on your abdomen on a firm bed or the floor (prone position). Prop yourself up on your elbows. Use your arms  to help lift your chest up until you feel a gentle stretch in your abdomen and your lower back. This will place some of your body weight on your elbows. If this is uncomfortable, try stacking pillows under your chest. Your hips should stay down, against the surface that you are lying on. Keep your hip and back muscles relaxed. Hold this position for __________ seconds. Slowly relax your upper body and return to the starting position. Repeat __________ times. Complete this exercise __________ times a day. Strengthening exercises These exercises build strength and endurance in your back. Endurance is the ability to use your muscles for a long time, even after they get tired. Pelvic tilt This exercise strengthens the muscles that lie deep in the abdomen. Lie on your back on a firm bed or the floor with your legs extended. Bend your knees so they are pointing toward the ceiling and your feet are flat on the floor. Tighten your lower abdominal muscles to press your lower back against the floor. This motion will tilt your pelvis so your tailbone points up toward the ceiling instead of pointing to your feet or the floor. To help with this exercise, you may place a small towel under your lower back and try to push your back into the towel. Hold this position for __________ seconds. Let your muscles relax completely before you repeat this exercise. Repeat __________ times. Complete this exercise __________ times a day. Alternating arm and leg raises  Get on your hands  and knees on a firm surface. If you are on a hard floor, you may want to use padding, such as an exercise mat, to cushion your knees. Line up your arms and legs. Your hands should be directly below your shoulders, and your knees should be directly below your hips. Lift your left leg behind you. At the same time, raise your right arm and straighten it in front of you. Do not lift your leg higher than your hip. Do not lift your arm higher  than your shoulder. Keep your abdominal and back muscles tight. Keep your hips facing the ground. Do not arch your back. Keep your balance carefully, and do not hold your breath. Hold this position for __________ seconds. Slowly return to the starting position. Repeat with your right leg and your left arm. Repeat __________ times. Complete this exercise __________ times a day. Abdominal set with straight leg raise  Lie on your back on a firm bed or the floor. Bend one of your knees and keep your other leg straight. Tense your abdominal muscles and lift your straight leg up, 4-6 inches (10-15 cm) off the ground. Keep your abdominal muscles tight and hold this position for __________ seconds. Do not hold your breath. Do not arch your back. Keep it flat against the ground. Keep your abdominal muscles tense as you slowly lower your leg back to the starting position. Repeat with your other leg. Repeat __________ times. Complete this exercise __________ times a day. Single leg lower with bent knees Lie on your back on a firm bed or the floor. Tense your abdominal muscles and lift your feet off the floor, one foot at a time, so your knees and hips are bent in 90-degree angles (right angles). Your knees should be over your hips and your lower legs should be parallel to the floor. Keeping your abdominal muscles tense and your knee bent, slowly lower one of your legs so your toe touches the ground. Lift your leg back up to return to the starting position. Do not hold your breath. Do not let your back arch. Keep your back flat against the ground. Repeat with your other leg. Repeat __________ times. Complete this exercise __________ times a day. Posture and body mechanics Good posture and healthy body mechanics can help to relieve stress in your body's tissues and joints. Body mechanics refers to the movements and positions of your body while you do your daily activities. Posture is part of body  mechanics. Good posture means: Your spine is in its natural S-curve position (neutral). Your shoulders are pulled back slightly. Your head is not tipped forward (neutral). Follow these guidelines to improve your posture and body mechanics in your everyday activities. Standing  When standing, keep your spine neutral and your feet about hip-width apart. Keep a slight bend in your knees. Your ears, shoulders, and hips should line up. When you do a task in which you stand in one place for a long time, place one foot up on a stable object that is 2-4 inches (5-10 cm) high, such as a footstool. This helps keep your spine neutral. Sitting  When sitting, keep your spine neutral and keep your feet flat on the floor. Use a footrest, if necessary, and keep your thighs parallel to the floor. Avoid rounding your shoulders, and avoid tilting your head forward. When working at a desk or a computer, keep your desk at a height where your hands are slightly lower than your elbows. Slide your  chair under your desk so you are close enough to maintain good posture. When working at a computer, place your monitor at a height where you are looking straight ahead and you do not have to tilt your head forward or downward to look at the screen. Resting When lying down and resting, avoid positions that are most painful for you. If you have pain with activities such as sitting, bending, stooping, or squatting, lie in a position in which your body does not bend very much. For example, avoid curling up on your side with your arms and knees near your chest (fetal position). If you have pain with activities such as standing for a long time or reaching with your arms, lie with your spine in a neutral position and bend your knees slightly. Try the following positions: Lying on your side with a pillow between your knees. Lying on your back with a pillow under your knees. Lifting  When lifting objects, keep your feet at least  shoulder-width apart and tighten your abdominal muscles. Bend your knees and hips and keep your spine neutral. It is important to lift using the strength of your legs, not your back. Do not lock your knees straight out. Always ask for help to lift heavy or awkward objects. This information is not intended to replace advice given to you by your health care provider. Make sure you discuss any questions you have with your health care provider. Document Revised: 09/26/2022 Document Reviewed: 08/10/2020 Elsevier Patient Education  2024 [employer].

## 2023-02-10 NOTE — Progress Notes (Signed)
OFFICE VISIT  02/26/2023  CC:  Chief Complaint  Patient presents with   Back Pain    Sporadic on R side, mid-back, 8/20 (dog attack);  has tried Tylenol arthritis strength. Denies any radiation.     Patient is a 67 y.o. male who presents for back pain.  HPI: Recent episode of R LB pain onset after doing heavy leg presses.  Pain hit him signif worse intermittently with flex/ext L spine.  No radiating pain, no paresthesias or weakness. Also sometimes hard to lay in bed long d/t discomfort, some sleep disruption lately.  He has localized prostate cancer. PET scan on 01/26/2023 showed no evidence of visceral metastases or skeletal metastases.  04/06/2017 hip and pelvis radiograph: "IMPRESSION: Degenerative changes lumbar spine and both hips. No acute bony or joint abnormality identified. Diffuse osteopenia.".  Past Medical History:  Diagnosis Date   Allergic rhinitis    BPH with obstruction/lower urinary tract symptoms    flomax and finasteride qod per urol   Coronary artery disease    With preserved EF.  Needs DAPT lifetime due to high risk status.   COVID 09/01/2020   Elevated PSA onset @ 2009   Neg bx x 3.  MRI c/w bph only.  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   GERD (gastroesophageal reflux disease)    Hearing loss of both ears    History of meniscal tear 2023   L knee   History of myocardial infarction 2011   Hyperlipidemia    Hypertension    Insomnia    Myocardial infarction [facility])    Organic impotence    Palpitations 05/2018   PVCs   Sciatica    Piriformis syndrome likely--improving with gabapentin as of 04/2017.  May need MRI L spine and/or emg/NCS   Squamous cell carcinoma     Past Surgical History:  Procedure Laterality Date   CARDIAC CATHETERIZATION  01/31/2013   Stents to LAD patent; small diagonal stent with 90% ostial stenosis--good overall LV function with mild anterior and focal inferoapical hypokinesis, EF 60-65%.  Unsuccessful  angioplasty of diagonal.   CARDIOVASCULAR STRESS TEST  05/26/2015   stress echo neg for ischemia   CARDIOVASCULAR STRESS TEST  10/04/2017   Myoc perf imaging: normal (EF 59%)   COLONOSCOPY  2008; 06/14/16   2008 Normal.  2018 hyperplastic polyp +internal hemorrhoids.  Recall 2028.   CYSTOSCOPY WITH INSERTION OF UROLIFT N/A 10/04/2021   Procedure: CYSTOSCOPY WITH INSERTION OF UROLIFT;  Surgeon: Malen Gauze, MD;  Location: [facility];  Service: Urology;  Laterality: N/A;   HYDROCELE EXCISION / REPAIR  early 2000's   INGUINAL HERNIA REPAIR Bilateral 1998   PROSTATE BIOPSY     Multiple; all neg.  Prostate MRI 2018 -NO SIGN OF PROSTATE CA.Marland Kitchen Rpt bx 12/2022->results pending as of 12/22/22   PTCA     Cath 2011---4 stents--LAD and diagonal.  2014 repeat cath for CP showed no changes.   Scrotal u/s  01/2015   numerous extratesticular cystic lesions bilat, c/w numerous epididymal cysts vs loculated hydrocele--following expectantly.    Outpatient Medications Prior to Visit  Medication Sig Dispense Refill   aspirin EC 81 MG tablet Take 81 mg by mouth daily.     atorvastatin (LIPITOR) 40 MG tablet Take 1 tablet (40 mg total) by mouth daily. 90 tablet 3   carvedilol (COREG) 6.25 MG tablet Take 1 tablet (6.25 mg total) by mouth 2 (two) times daily. 180 tablet 3   lisinopril (ZESTRIL) 5 MG tablet  Take 1 tablet (5 mg total) by mouth daily. 90 tablet 3   melatonin 5 MG TABS Take 5 mg by mouth at bedtime.     Multiple Vitamins-Minerals (MULTIVITAMIN ADULT PO) Take 1 capsule by mouth daily.     nitroGLYCERIN (NITROLINGUAL) 0.4 MG/SPRAY spray Place 1 spray under the tongue every 5 (five) minutes x 3 doses as needed for chest pain. 12 g 3   omeprazole (PRILOSEC) 20 MG capsule Take 20 mg by mouth daily.     ticagrelor (BRILINTA) 60 MG TABS tablet Take 1 tablet (60 mg total) by mouth 2 (two) times daily. 180 tablet 3   amoxicillin-clavulanate (AUGMENTIN) 875-125 MG tablet Take 1 tablet by mouth every 12  (twelve) hours. (Patient not taking: Reported on 02/08/2023) 14 tablet 0   No facility-administered medications prior to visit.    Allergies  Allergen Reactions   Cephalexin Other (See Comments)    Cause the patient to develop Cdiff     Review of Systems  As per HPI  PE:    02/17/2023   12:51 PM 02/10/2023    2:20 PM 02/08/2023    8:39 AM  Vitals with BMI  Height  5\' 9"  5\' 9"   Weight  166 lbs 166 lbs 3 oz  BMI  24.5 24.53  Systolic 158 104 191  Diastolic 69 62 60  Pulse 60 55 53     Physical Exam  Gen: Alert, well appearing.  Patient is oriented to person, place, time, and situation. Cervical, thoracic and lumbar spine exam is normal without tenderness, masses or kyphoscoliosis. Full range of motion without pain is noted.   LABS:  None today  IMPRESSION AND PLAN:  Musculoskeletal LBP/Lumbago recurrence. Slow to resolve. Home rehab exercises reviewed, handout given.  Discussed adjustment of wt lifting regimen for a while. Short term prn vicodin rx'd: 5/325, 1-2 bid prn, #30.  An After Visit Summary was printed and given to the patient.  FOLLOW UP: Return if symptoms worsen or fail to improve.  Signed:  Santiago Bumpers, MD           02/26/2023

## 2023-02-13 ENCOUNTER — Ambulatory Visit: Payer: Medicare Other | Admitting: Urology

## 2023-02-14 ENCOUNTER — Encounter: Payer: Self-pay | Admitting: Urology

## 2023-02-15 ENCOUNTER — Other Ambulatory Visit: Payer: Medicare Other

## 2023-02-15 DIAGNOSIS — C61 Malignant neoplasm of prostate: Secondary | ICD-10-CM

## 2023-02-16 LAB — PSA: Prostate Specific Ag, Serum: 18.8 ng/mL — ABNORMAL HIGH (ref 0.0–4.0)

## 2023-02-16 NOTE — Progress Notes (Signed)
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  Patient meet with [facility] and has decided to proceed with surgical intervention with Dr. Luane School on 9/23.  Patient does see Dr. Ronne Binning tomorrow, 9/13, for ongoing urology follow up.

## 2023-02-17 ENCOUNTER — Ambulatory Visit (INDEPENDENT_AMBULATORY_CARE_PROVIDER_SITE_OTHER): Payer: Medicare Other | Admitting: Urology

## 2023-02-17 VITALS — BP 158/69 | HR 60

## 2023-02-17 DIAGNOSIS — C61 Malignant neoplasm of prostate: Secondary | ICD-10-CM

## 2023-02-17 LAB — URINALYSIS, ROUTINE W REFLEX MICROSCOPIC
Bilirubin, UA: NEGATIVE
Glucose, UA: NEGATIVE
Ketones, UA: NEGATIVE
Leukocytes,UA: NEGATIVE
Nitrite, UA: NEGATIVE
Protein,UA: NEGATIVE
RBC, UA: NEGATIVE
Specific Gravity, UA: 1.015 (ref 1.005–1.030)
Urobilinogen, Ur: 0.2 mg/dL (ref 0.2–1.0)
pH, UA: 6 (ref 5.0–7.5)

## 2023-02-17 NOTE — Progress Notes (Unsigned)
02/17/2023 1:05 PM   Ricky Chavez 02-11-1956 [id number]  Referring provider: Jeoffrey Massed, MD [address] [place],  [place] [address]  No chief complaint on file.   HPI:    PMH: Past Medical History:  Diagnosis Date   Allergic rhinitis    BPH with obstruction/lower urinary tract symptoms    flomax and finasteride qod per urol   Coronary artery disease    With preserved EF.  Needs DAPT lifetime due to high risk status.   COVID 09/01/2020   Elevated PSA onset @ 2009   Neg bx x 3.  MRI c/w bph only.  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   GERD (gastroesophageal reflux disease)    Hearing loss of both ears    History of meniscal tear 2023   L knee   History of myocardial infarction 2011   Hyperlipidemia    Hypertension    Insomnia    Myocardial infarction [facility])    Organic impotence    Palpitations 05/2018   PVCs   Sciatica    Piriformis syndrome likely--improving with gabapentin as of 04/2017.  May need MRI L spine and/or emg/NCS   Squamous cell carcinoma     Surgical History: Past Surgical History:  Procedure Laterality Date   CARDIAC CATHETERIZATION  01/31/2013   Stents to LAD patent; small diagonal stent with 90% ostial stenosis--good overall LV function with mild anterior and focal inferoapical hypokinesis, EF 60-65%.  Unsuccessful angioplasty of diagonal.   CARDIOVASCULAR STRESS TEST  05/26/2015   stress echo neg for ischemia   CARDIOVASCULAR STRESS TEST  10/04/2017   Myoc perf imaging: normal (EF 59%)   COLONOSCOPY  2008; 06/14/16   2008 Normal.  2018 hyperplastic polyp +internal hemorrhoids.  Recall 2028.   CYSTOSCOPY WITH INSERTION OF UROLIFT N/A 10/04/2021   Procedure: CYSTOSCOPY WITH INSERTION OF UROLIFT;  Surgeon: Malen Gauze, MD;  Location: [facility];  Service: Urology;  Laterality: N/A;   HYDROCELE EXCISION / REPAIR  early 2000's   INGUINAL HERNIA REPAIR Bilateral 1998   PROSTATE BIOPSY     Multiple; all neg.   Prostate MRI 2018 -NO SIGN OF PROSTATE CA.Marland Kitchen Rpt bx 12/2022->results pending as of 12/22/22   PTCA     Cath 2011---4 stents--LAD and diagonal.  2014 repeat cath for CP showed no changes.   Scrotal u/s  01/2015   numerous extratesticular cystic lesions bilat, c/w numerous epididymal cysts vs loculated hydrocele--following expectantly.    Home Medications:  Allergies as of 02/17/2023       Reactions   Cephalexin Other (See Comments)   Cause the patient to develop Cdiff         Medication List        Accurate as of February 17, 2023  1:05 PM. If you have any questions, ask your nurse or doctor.          aspirin EC 81 MG tablet Take 81 mg by mouth daily.   atorvastatin 40 MG tablet Commonly known as: LIPITOR Take 1 tablet (40 mg total) by mouth daily.   carvedilol 6.25 MG tablet Commonly known as: Coreg Take 1 tablet (6.25 mg total) by mouth 2 (two) times daily.   HYDROcodone-acetaminophen 5-325 MG tablet Commonly known as: NORCO/VICODIN 1-2 tabs po bid prn pain   lisinopril 5 MG tablet Commonly known as: ZESTRIL Take 1 tablet (5 mg total) by mouth daily.   melatonin 5 MG Tabs Take 5 mg by mouth at  bedtime.   MULTIVITAMIN ADULT PO Take 1 capsule by mouth daily.   nitroGLYCERIN 0.4 MG/SPRAY spray Commonly known as: NITROLINGUAL Place 1 spray under the tongue every 5 (five) minutes x 3 doses as needed for chest pain.   omeprazole 20 MG capsule Commonly known as: PRILOSEC Take 20 mg by mouth daily.   ticagrelor 60 MG Tabs tablet Commonly known as: Brilinta Take 1 tablet (60 mg total) by mouth 2 (two) times daily.        Allergies:  Allergies  Allergen Reactions   Cephalexin Other (See Comments)    Cause the patient to develop Cdiff     Family History: Family History  Problem Relation Age of Onset   Hyperlipidemia Mother    Hypertension Mother    Heart disease Mother 21       CABG   Other Mother        Neuroendocrine carcinoma pancreas (solid  mass) and liver (diffuse)   Heart disease Father 80       CABG   Hyperlipidemia Father    Hypertension Father    Colon polyps Father    Hypothyroidism Sister    Heart attack Brother 20       MI   Prostate cancer Paternal Uncle    Peripheral vascular disease Brother        Carotid    Stroke Neg Hx    Diabetes Neg Hx     Social History:  reports that he quit smoking about 44 years ago. His smoking use included cigarettes. He started smoking about 46 years ago. He has a 0.5 pack-year smoking history. He has never used smokeless tobacco. He reports current alcohol use of about 14.0 standard drinks of alcohol per week. He reports that he does not use drugs.  ROS: All other review of systems were reviewed and are negative except what is noted above in HPI  Physical Exam: BP (!) 158/69   Pulse 60   Constitutional:  Alert and oriented, No acute distress. HEENT: [place] AT, moist mucus membranes.  Trachea midline, no masses. Cardiovascular: No clubbing, cyanosis, or edema. Respiratory: Normal respiratory effort, no increased work of breathing. GI: Abdomen is soft, nontender, nondistended, no abdominal masses GU: No CVA tenderness.  Lymph: No cervical or inguinal lymphadenopathy. Skin: No rashes, bruises or suspicious lesions. Neurologic: Grossly intact, no focal deficits, moving all 4 extremities. Psychiatric: Normal mood and affect.  Laboratory Data: Lab Results  Component Value Date   WBC 7.2 12/22/2022   HGB 14.3 12/22/2022   HCT 43.3 12/22/2022   MCV 92.7 12/22/2022   PLT 199.0 12/22/2022    Lab Results  Component Value Date   CREATININE 0.97 12/22/2022    Lab Results  Component Value Date   PSA 6.62 (H) 12/21/2021   PSA 4.4 (H) 11/25/2019   PSA 5.20 11/20/2019    Lab Results  Component Value Date   TESTOSTERONE 414 11/25/2019    Lab Results  Component Value Date   HGBA1C 5.7 12/22/2022    Urinalysis    Component Value Date/Time   APPEARANCEUR Clear  09/21/2022 1010   GLUCOSEU Negative 09/21/2022 1010   BILIRUBINUR Negative 09/21/2022 1010   PROTEINUR Negative 09/21/2022 1010   NITRITE Negative 09/21/2022 1010   LEUKOCYTESUR Negative 09/21/2022 1010    Lab Results  Component Value Date   LABMICR Comment 09/21/2022   WBCUA >30 (A) 01/17/2022   LABEPIT 0-10 01/17/2022   BACTERIA Many (A) 01/17/2022    Pertinent Imaging: ***  No results found for this or any previous visit.  No results found for this or any previous visit.  No results found for this or any previous visit.  No results found for this or any previous visit.  No results found for this or any previous visit.  No valid procedures specified. No results found for this or any previous visit.  No results found for this or any previous visit.   Assessment & Plan:    1. Prostate cancer [facility]) Patient is wishing to proceed with radical prostatectomy at [facility]  - Urinalysis, Routine w reflex microscopic   No follow-ups on file.  Wilkie Aye, MD  [facility] [place]

## 2023-02-21 ENCOUNTER — Encounter: Payer: Self-pay | Admitting: Urology

## 2023-02-21 NOTE — Patient Instructions (Signed)

## 2023-02-27 DIAGNOSIS — Z9079 Acquired absence of other genital organ(s): Secondary | ICD-10-CM | POA: Insufficient documentation

## 2023-02-27 HISTORY — PX: ROBOT ASSISTED LAPAROSCOPIC RADICAL PROSTATECTOMY: SHX5141

## 2023-03-01 ENCOUNTER — Telehealth: Payer: Self-pay

## 2023-03-01 NOTE — Transitions of Care (Post Inpatient/ED Visit) (Signed)
03/01/2023  Name: Ricky Chavez MRN: [id number] DOB: 11-Jan-1956  Today's TOC FU Call Status: Today's TOC FU Call Status:: Successful TOC FU Call Completed TOC FU Call Complete Date: 03/01/23 Patient's Name and Date of Birth confirmed.  Transition Care Management Follow-up Telephone Call Date of Discharge: 02/28/23 Discharge Facility: Other [facility]) Name of Other (Non[facility]) Discharge Facility: [facility] Type of Discharge: Inpatient Admission Primary Inpatient Discharge Diagnosis:: neoplasm prostate How have you been since you were released from the hospital?: Better Any questions or concerns?: No  Items Reviewed: Did you receive and understand the discharge instructions provided?: Yes Medications obtained,verified, and reconciled?: Yes (Medications Reviewed) Any new allergies since your discharge?: No Dietary orders reviewed?: Yes Do you have support at home?: Yes People in Home: spouse  Medications Reviewed Today: Medications Reviewed Today     Reviewed by Karena Addison, LPN (Licensed Practical Nurse) on 03/01/23 at 1150  Med List Status: <None>   Medication Order Taking? Sig Documenting Provider Last Dose Status Informant  aspirin EC 81 MG tablet [id number] No Take 81 mg by mouth daily. [provider] Taking Active Self           Med Note Lajoyce Lauber Dec 17, 2015  9:24 AM)    atorvastatin (LIPITOR) 40 MG tablet [id number] No Take 1 tablet (40 mg total) by mouth daily. Carlos Levering, NP Taking Active   carvedilol (COREG) 6.25 MG tablet [id number] No Take 1 tablet (6.25 mg total) by mouth 2 (two) times daily. Carlos Levering, NP Taking Active   HYDROcodone-acetaminophen (NORCO/VICODIN) 5-325 MG tablet [id number] No 1-2 tabs po bid prn pain  Patient not taking: Reported on 02/17/2023   Jeoffrey Massed, MD Not Taking Active   lisinopril (ZESTRIL) 5 MG tablet [id number] No Take 1 tablet (5 mg total) by mouth daily. Carlos Levering,  NP Taking Active   melatonin 5 MG TABS [id number] No Take 5 mg by mouth at bedtime. [provider] Taking Active Self           Med Note Lajoyce Lauber Dec 17, 2015  9:24 AM)    Multiple Vitamins-Minerals (MULTIVITAMIN ADULT PO) [id number] No Take 1 capsule by mouth daily. [provider] Taking Active Self           Med Note Lajoyce Lauber Dec 17, 2015  9:24 AM)    nitroGLYCERIN (NITROLINGUAL) 0.4 MG/SPRAY spray [id number] No Place 1 spray under the tongue every 5 (five) minutes x 3 doses as needed for chest pain. Carlos Levering, NP Taking Active   omeprazole (PRILOSEC) 20 MG capsule [id number] No Take 20 mg by mouth daily. [provider] Taking Active Self           Med Note Lajoyce Lauber Dec 17, 2015  9:25 AM)    ticagrelor (BRILINTA) 60 MG TABS tablet [id number] No Take 1 tablet (60 mg total) by mouth 2 (two) times daily. Carlos Levering, NP Taking Active             Home Care and Equipment/Supplies: Were Home Health Services Ordered?: NA Any new equipment or medical supplies ordered?: NA  Functional Questionnaire: Do you need assistance with bathing/showering or dressing?: Yes Do you need assistance with meal preparation?: No Do you need assistance with eating?: No Do you have difficulty maintaining continence: Yes Do you need assistance with getting out of bed/getting out of a chair/moving?: No Do you have  difficulty managing or taking your medications?: No  Follow up appointments reviewed: PCP Follow-up appointment confirmed?: Yes Date of PCP follow-up appointment?: 03/13/23 Follow-up Provider: [facility] Follow-up appointment confirmed?: Yes Date of Specialist follow-up appointment?: 03/10/23 Follow-Up Specialty Provider:: surgeon Do you need transportation to your follow-up appointment?: No Do you understand care options if your condition(s) worsen?: Yes-patient verbalized  understanding    SIGNATURE Karena Addison, LPN [facility] Nurse Health Advisor Direct Dial [phone]

## 2023-03-02 NOTE — Telephone Encounter (Signed)
Noted: nurse phone contact with patient for TCM. Signed:  Santiago Bumpers, MD           03/02/2023

## 2023-03-08 ENCOUNTER — Ambulatory Visit: Payer: Medicare Other

## 2023-03-08 VITALS — Wt 166.0 lb

## 2023-03-08 DIAGNOSIS — Z0000 Encounter for general adult medical examination without abnormal findings: Secondary | ICD-10-CM | POA: Diagnosis not present

## 2023-03-08 NOTE — Progress Notes (Signed)
Subjective:   Ricky Chavez is a 67 y.o. male who presents for an Initial Medicare Annual Wellness Visit.  Visit Complete: Virtual  I connected with  Ricky Chavez on 03/08/23 by a audio enabled telemedicine application and verified that I am speaking with the correct person using two identifiers.  Patient Location: Home  Provider Location: Home Office  I discussed the limitations of evaluation and management by telemedicine. The patient expressed understanding and agreed to proceed.   Cardiac Risk Factors include: advanced age (>4men, >56 women);hypertension;dyslipidemia;male gender     Objective:    Today's Vitals   03/08/23 1036  Weight: 166 lb (75.3 kg)   Body mass index is 24.51 kg/m.     03/08/2023   10:41 AM 01/31/2023    8:13 AM 01/24/2023    6:31 PM 09/29/2021    8:49 AM 06/14/2016    1:12 PM 10/08/2015   12:29 PM  Advanced Directives  Does Patient Have a Medical Advance Directive? Yes Yes No No No No  Type of Estate agent of [place];Living will Healthcare Power of [place];Living will      Copy of Healthcare Power of Attorney in Chart? No - copy requested       Would patient like information on creating a medical advance directive?   No - Patient declined No - Patient declined  Yes - Educational materials given    Current Medications (verified) Outpatient Encounter Medications as of 03/08/2023  Medication Sig   aspirin EC 81 MG tablet Take 81 mg by mouth daily.   atorvastatin (LIPITOR) 40 MG tablet Take 1 tablet (40 mg total) by mouth daily.   carvedilol (COREG) 6.25 MG tablet Take 1 tablet (6.25 mg total) by mouth 2 (two) times daily.   lisinopril (ZESTRIL) 5 MG tablet Take 1 tablet (5 mg total) by mouth daily.   melatonin 5 MG TABS Take 5 mg by mouth at bedtime.   Multiple Vitamins-Minerals (MULTIVITAMIN ADULT PO) Take 1 capsule by mouth daily.   nitroGLYCERIN (NITROLINGUAL) 0.4 MG/SPRAY spray Place 1 spray under the tongue every 5  (five) minutes x 3 doses as needed for chest pain.   omeprazole (PRILOSEC) 20 MG capsule Take 20 mg by mouth daily.   ticagrelor (BRILINTA) 60 MG TABS tablet Take 1 tablet (60 mg total) by mouth 2 (two) times daily.   [DISCONTINUED] HYDROcodone-acetaminophen (NORCO/VICODIN) 5-325 MG tablet 1-2 tabs po bid prn pain (Patient not taking: Reported on 02/17/2023)   No facility-administered encounter medications on file as of 03/08/2023.    Allergies (verified) Cephalexin   History: Past Medical History:  Diagnosis Date   Allergic rhinitis    BPH with obstruction/lower urinary tract symptoms    flomax and finasteride qod per urol   Coronary artery disease    With preserved EF.  Needs DAPT lifetime due to high risk status.   COVID 09/01/2020   Elevated PSA onset @ 2009   Neg bx x 3.  MRI c/w bph only.  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   GERD (gastroesophageal reflux disease)    Hearing loss of both ears    History of meniscal tear 2023   L knee   History of myocardial infarction 2011   Hyperlipidemia    Hypertension    Insomnia    Myocardial infarction [facility])    Organic impotence    Palpitations 05/2018   PVCs   Sciatica    Piriformis syndrome likely--improving with gabapentin as of 04/2017.  May need MRI L spine and/or emg/NCS   Squamous cell carcinoma    Past Surgical History:  Procedure Laterality Date   CARDIAC CATHETERIZATION  01/31/2013   Stents to LAD patent; small diagonal stent with 90% ostial stenosis--good overall LV function with mild anterior and focal inferoapical hypokinesis, EF 60-65%.  Unsuccessful angioplasty of diagonal.   CARDIOVASCULAR STRESS TEST  05/26/2015   stress echo neg for ischemia   CARDIOVASCULAR STRESS TEST  10/04/2017   Myoc perf imaging: normal (EF 59%)   COLONOSCOPY  2008; 06/14/16   2008 Normal.  2018 hyperplastic polyp +internal hemorrhoids.  Recall 2028.   CYSTOSCOPY WITH INSERTION OF UROLIFT N/A 10/04/2021    Procedure: CYSTOSCOPY WITH INSERTION OF UROLIFT;  Surgeon: Malen Gauze, MD;  Location: [facility];  Service: Urology;  Laterality: N/A;   HYDROCELE EXCISION / REPAIR  early 2000's   INGUINAL HERNIA REPAIR Bilateral 1998   PROSTATE BIOPSY     Multiple; all neg.  Prostate MRI 2018 -NO SIGN OF PROSTATE CA.Marland Kitchen Rpt bx 12/2022->results pending as of 12/22/22   PTCA     Cath 2011---4 stents--LAD and diagonal.  2014 repeat cath for CP showed no changes.   Scrotal u/s  01/2015   numerous extratesticular cystic lesions bilat, c/w numerous epididymal cysts vs loculated hydrocele--following expectantly.   Family History  Problem Relation Age of Onset   Hyperlipidemia Mother    Hypertension Mother    Heart disease Mother 37       CABG   Other Mother        Neuroendocrine carcinoma pancreas (solid mass) and liver (diffuse)   Heart disease Father 76       CABG   Hyperlipidemia Father    Hypertension Father    Colon polyps Father    Hypothyroidism Sister    Heart attack Brother 72       MI   Prostate cancer Paternal Uncle    Peripheral vascular disease Brother        Carotid    Stroke Neg Hx    Diabetes Neg Hx    Social History   Socioeconomic History   Marital status: Married    Spouse name: Not on file   Number of children: 2   Years of education: Not on file   Highest education level: Master's degree (e.g., MA, MS, MEng, MEd, MSW, MBA)  Occupational History   Occupation: retired  Tobacco Use   Smoking status: Former    Current packs/day: 0.00    Average packs/day: 0.3 packs/day for 2.0 years (0.5 ttl pk-yrs)    Types: Cigarettes    Start date: 06/06/1976    Quit date: 06/06/1978    Years since quitting: 44.7   Smokeless tobacco: Never  Vaping Use   Vaping status: Never Used  Substance and Sexual Activity   Alcohol use: Yes    Alcohol/week: 14.0 standard drinks of alcohol    Types: 14 Glasses of wine per week   Drug use: No   Sexual activity: Yes  Other Topics Concern   Not  on file  Social History Narrative   Married, 2 grown children.   Relocated to [place] from [facility] 2016.   Educ: Masters degree   Occup: Retired Emergency planning/management officer with [employer]   No tob.   Alc: 1 glass wine/night.   Social Determinants of Health   Financial Resource Strain: Low Risk  (03/08/2023)   Overall Financial Resource Strain (CARDIA)    Difficulty of Paying Living Expenses: Not  hard at all  Food Insecurity: No Food Insecurity (03/08/2023)   Hunger Vital Sign    Worried About Running Out of Food in the Last Year: Never true    Ran Out of Food in the Last Year: Never true  Transportation Needs: No Transportation Needs (03/08/2023)   PRAPARE - Administrator, Civil Service (Medical): No    Lack of Transportation (Non-Medical): No  Physical Activity: Inactive (03/08/2023)   Exercise Vital Sign    Days of Exercise per Week: 0 days    Minutes of Exercise per Session: 0 min  Stress: No Stress Concern Present (03/08/2023)   [employer] of Occupational Health - Occupational Stress Questionnaire    Feeling of Stress : Not at all  Social Connections: Socially Integrated (03/08/2023)   Social Connection and Isolation Panel [NHANES]    Frequency of Communication with Friends and Family: More than three times a week    Frequency of Social Gatherings with Friends and Family: More than three times a week    Attends Religious Services: More than 4 times per year    Active Member of [employer] or Organizations: Yes    Attends Banker Meetings: 1 to 4 times per year    Marital Status: Married    Tobacco Counseling Counseling given: Not Answered   Clinical Intake:  Pre-visit preparation completed: Yes  Pain : No/denies pain     BMI - recorded: 24.51 Nutritional Status: BMI 25 -29 Overweight Nutritional Risks: None Diabetes: No  How often do you need to have someone help you when you read instructions, pamphlets, or other written materials from your  doctor or pharmacy?: 1 - Never  Interpreter Needed?: No  Information entered by :: Lanier Ensign, LPN   Activities of Daily Living    03/08/2023   10:37 AM  In your present state of health, do you have any difficulty performing the following activities:  Hearing? 1  Comment hearing aid  Vision? 0  Difficulty concentrating or making decisions? 0  Walking or climbing stairs? 0  Dressing or bathing? 0  Doing errands, shopping? 0  Preparing Food and eating ? N  Using the Toilet? N  In the past six months, have you accidently leaked urine? N  Comment has a foley catheter at this time  Do you have problems with loss of bowel control? N  Managing your Medications? N  Managing your Finances? N  Housekeeping or managing your Housekeeping? N    Patient Care Team: Jeoffrey Massed, MD as PCP - General (Family Medicine) Rollene Rotunda, MD as PCP - Cardiology (Cardiology) Cherlyn Roberts, MD as Consulting Physician (Dermatology) Ronne Binning Mardene Celeste, MD as Consulting Physician (Urology) Marcine Matar, MD as Consulting Physician (Urology) Rachael Fee, MD as Consulting Physician (Gastroenterology) Rollene Rotunda, MD as Consulting Physician (Cardiology) Cherlyn Cushing, RN as Oncology Nurse Navigator  Indicate any recent Medical Services you may have received from other than [facility] providers in the past year (date may be approximate).     Assessment:   This is a routine wellness examination for [place].  Hearing/Vision screen Hearing Screening - Comments:: Hearing aids Vision Screening - Comments:: [facility]   Goals Addressed             This Visit's Progress    Patient Stated       Get back to exercise in the gym        Depression Screen    03/08/2023  10:41 AM 02/10/2023    2:22 PM 02/01/2023   11:33 AM 01/31/2023    8:18 AM 06/23/2022    9:11 AM 06/23/2022    9:06 AM 06/22/2021    9:40 AM  PHQ 2/9 Scores  PHQ - 2 Score 0 2 2 2  0 0 0  PHQ- 9  Score 0 4 5        Fall Risk    03/08/2023   10:43 AM 02/01/2023   11:33 AM 06/21/2022    9:53 AM 11/25/2019    7:57 AM 04/13/2017   12:50 PM  Fall Risk   Falls in the past year? 0 0 0 1 No  Number falls in past yr: 0   0   Injury with Fall? 0   0   Risk for fall due to : Impaired vision No Fall Risks     Follow up Falls prevention discussed Falls evaluation completed       MEDICARE RISK AT HOME: Medicare Risk at Home Any stairs in or around the home?: Yes If so, are there any without handrails?: No Home free of loose throw rugs in walkways, pet beds, electrical cords, etc?: Yes Adequate lighting in your home to reduce risk of falls?: Yes Life alert?: No Use of a cane, walker or w/c?: No Grab bars in the bathroom?: Yes Shower chair or bench in shower?: No Elevated toilet seat or a handicapped toilet?: No  TIMED UP AND GO:  Was the test performed? No    Cognitive Function:        Immunizations Immunization History  Administered Date(s) Administered   Fluad Trivalent(High Dose 65+) 02/01/2023   Hepatitis A, Adult 06/06/2006   Hepatitis B, ADULT 06/06/2006   Influenza Inj Mdck Quad Pf 03/08/2017   Influenza Inj Mdck Quad With Preservative 02/27/2019   Influenza,inj,Quad PF,6+ Mos 06/07/2011, 03/28/2013, 03/05/2014, 03/12/2018, 06/22/2021   Influenza-Unspecified 02/24/2016, 04/28/2020, 03/19/2022   PFIZER(Purple Top)SARS-COV-2 Vaccination 08/22/2019, 09/17/2019, 03/22/2020   PNEUMOCOCCAL CONJUGATE-20 12/22/2022   Pneumococcal Polysaccharide-23 05/12/2014   Pneumococcal-Unspecified 06/06/2014   Rabies, IM 01/25/2023, 01/27/2023, 01/31/2023   Td 12/05/2002   Tdap 09/09/2015   Tetanus 06/06/2006   Zoster Recombinant(Shingrix) 11/21/2018, 05/22/2019   Zoster, Live 03/28/2013    TDAP status: Up to date  Flu Vaccine status: Up to date  Pneumococcal vaccine status: Up to date  Covid-19 vaccine status: Information provided on how to obtain vaccines.   Qualifies for  Shingles Vaccine? Yes   Zostavax completed Yes   Shingrix Completed?: Yes  Screening Tests Health Maintenance  Topic Date Due   COVID-19 Vaccine (4 - 2023-24 season) 02/05/2023   Medicare Annual Wellness (AWV)  03/07/2024   DTaP/Tdap/Td (4 - Td or Tdap) 09/08/2025   Colonoscopy  06/14/2026   Pneumonia Vaccine 12+ Years old  Completed   INFLUENZA VACCINE  Completed   Hepatitis C Screening  Completed   Zoster Vaccines- Shingrix  Completed   HPV VACCINES  Aged Out    Health Maintenance  Health Maintenance Due  Topic Date Due   COVID-19 Vaccine (4 - 2023-24 season) 02/05/2023    Colorectal cancer screening: Type of screening: Colonoscopy. Completed 06/14/16. Repeat every 10 years  Additional Screening:  Hepatitis C Screening:  Completed 09/01/16  Vision Screening: Recommended annual ophthalmology exams for early detection of glaucoma and other disorders of the eye. Is the patient up to date with their annual eye exam?  Yes  Who is the provider or what is the name of the office in  which the patient attends annual eye exams? My eye Dr in [place]  If pt is not established with a provider, would they like to be referred to a provider to establish care? No .   Dental Screening: Recommended annual dental exams for proper oral hygiene   Community Resource Referral / Chronic Care Management: CRR required this visit?  No   CCM required this visit?  No    Plan:     I have personally reviewed and noted the following in the patient's chart:   Medical and social history Use of alcohol, tobacco or illicit drugs  Current medications and supplements including opioid prescriptions. Patient is not currently taking opioid prescriptions. Functional ability and status Nutritional status Physical activity Advanced directives List of other physicians Hospitalizations, surgeries, and ER visits in previous 12 months Vitals Screenings to include cognitive, depression, and falls Referrals  and appointments  In addition, I have reviewed and discussed with patient certain preventive protocols, quality metrics, and best practice recommendations. A written personalized care plan for preventive services as well as general preventive health recommendations were provided to patient.     Marzella Schlein, LPN   [id number]   After Visit Summary: (MyChart) Due to this being a telephonic visit, the after visit summary with patients personalized plan was offered to patient via MyChart   Nurse Notes: none

## 2023-03-08 NOTE — Patient Instructions (Signed)
Mr. Ricky Chavez , Thank you for taking time to come for your Medicare Wellness Visit. I appreciate your ongoing commitment to your health goals. Please review the following plan we discussed and let me know if I can assist you in the future.   Referrals/Orders/Follow-Ups/Clinician Recommendations: Aim for 30 minutes of exercise or brisk walking, 6-8 glasses of water, and 5 servings of fruits and vegetables each day.   This is a list of the screening recommended for you and due dates:  Health Maintenance  Topic Date Due   COVID-19 Vaccine (4 - 2023-24 season) 02/05/2023   Medicare Annual Wellness Visit  03/07/2024   DTaP/Tdap/Td vaccine (4 - Td or Tdap) 09/08/2025   Colon Cancer Screening  06/14/2026   Pneumonia Vaccine  Completed   Flu Shot  Completed   Hepatitis C Screening  Completed   Zoster (Shingles) Vaccine  Completed   HPV Vaccine  Aged Out    Advanced directives: (Copy Requested) Please bring a copy of your health care power of attorney and living will to the office to be added to your chart at your convenience.  Next Medicare Annual Wellness Visit scheduled for next year: Yes

## 2023-03-10 ENCOUNTER — Encounter: Payer: Self-pay | Admitting: Family Medicine

## 2023-03-13 ENCOUNTER — Ambulatory Visit: Payer: Medicare Other | Admitting: Family Medicine

## 2023-03-13 ENCOUNTER — Encounter: Payer: Self-pay | Admitting: Family Medicine

## 2023-03-13 VITALS — BP 112/72 | HR 54 | Wt 164.2 lb

## 2023-03-13 DIAGNOSIS — Z9079 Acquired absence of other genital organ(s): Secondary | ICD-10-CM | POA: Diagnosis not present

## 2023-03-13 DIAGNOSIS — Z8546 Personal history of malignant neoplasm of prostate: Secondary | ICD-10-CM

## 2023-03-13 DIAGNOSIS — D696 Thrombocytopenia, unspecified: Secondary | ICD-10-CM

## 2023-03-13 DIAGNOSIS — I723 Aneurysm of iliac artery: Secondary | ICD-10-CM

## 2023-03-13 DIAGNOSIS — R739 Hyperglycemia, unspecified: Secondary | ICD-10-CM

## 2023-03-13 DIAGNOSIS — C61 Malignant neoplasm of prostate: Secondary | ICD-10-CM

## 2023-03-13 NOTE — Addendum Note (Signed)
Addended by: Jeoffrey Massed on: 03/13/2023 11:28 AM   Modules accepted: Orders

## 2023-03-13 NOTE — Progress Notes (Signed)
03/13/2023  CC:  Chief Complaint  Patient presents with   Hospitalization Follow-up    9/23-9/24; Pt was admitted for surgery, pt states recovery is going well. 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 Pt feels concerned. Was prescribed Oxycodone for pain as well as Oxybutyin, both which pt states he has discontinued.     Patient is a 67 y.o.  male who presents for  hospital follow up, specifically Transitional Care Services face-to-face visit. Dates hospitalized: 9/23 to 02/28/2023. Days since d/c from hospital: 13 days Patient was discharged from hospital to home Reason for admission to hospital: Prostate cancer/radical prostatectomy. Date of interactive (phone) contact with patient and/or caregiver: 03/01/2023  I have reviewed patient's discharge summary plus pertinent specific notes, labs, and imaging from the hospitalization.   Diagnosis of high-grade very high risk prostate cancer.  He had robotic assisted laparoscopic radical prostatectomy and bilateral pelvic lymphadenectomy done on 02/27/2023.  No complications.  Brief Hospital course unremarkable.  Discharged home in good condition on the following discharge medications: Oxybutynin 10 mg 24-hour tablet, 1 daily as needed.  Oxycodone 5 mg every 6 hours as needed pain.  Continued on aspirin 81 mg a day, atorvastatin 40 mg a day, Brilinta 60 mg twice daily, carvedilol 6.25 mg twice daily, lisinopril 5 mg daily, omeprazole 20 mg daily.  Labs on 02/28/2023 showed a white blood cell count of 11.9, hemoglobin 14.2 platelets 138,000.  His glucose was 235. Basic metabolic panel all normal, including serum creatinine 1.02.  CURRENTLY: Ricky Chavez is feeling very well. Not requiring any oxybutynin or oxycodone. Reviewed path w/pt--->prostate adenocarcinoma with perineural invasion, margins neg, nodes neg. 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  Medication reconciliation was done today and patient is taking meds as recommended by discharging hospitalist/specialist.    ROS as above, plus--> no fevers, no CP, no SOB, no wheezing, no cough, no dizziness, no HAs, no rashes, no melena/hematochezia.  No polyuria or polydipsia.  No myalgias or arthralgias.  No focal weakness, paresthesias, or tremors.  No acute vision or hearing abnormalities.  No dysuria or unusual/new urinary urgency or frequency.  No recent changes in lower legs. No n/v/d or abd pain.  No palpitations.    PMH:  Past Medical History:  Diagnosis Date   Allergic rhinitis    BPH with obstruction/lower urinary tract symptoms    flomax and finasteride qod per urol   Coronary artery disease    With preserved EF.  Needs DAPT lifetime due to high risk status.   COVID 09/01/2020   Elevated PSA onset @ 2009   Neg bx x 3.  MRI c/w bph only.  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   GERD (gastroesophageal reflux disease)    Hearing loss of both ears    History of meniscal tear 2023   L knee   History of myocardial infarction 2011   Hyperlipidemia    Hypertension    Insomnia    Myocardial infarction [facility])    Organic impotence    Palpitations 05/2018   PVCs   Sciatica    Piriformis syndrome likely--improving with gabapentin as of 04/2017.  May need MRI L spine and/or emg/NCS   Squamous cell carcinoma     PSH:  Past Surgical History:  Procedure Laterality Date   CARDIAC CATHETERIZATION  01/31/2013   Stents to LAD  patent; small diagonal stent with 90% ostial stenosis--good overall LV function with mild anterior and focal inferoapical hypokinesis, EF 60-65%.  Unsuccessful angioplasty of diagonal.   CARDIOVASCULAR STRESS TEST  05/26/2015   stress echo neg for ischemia   CARDIOVASCULAR STRESS TEST  10/04/2017   Myoc perf imaging: normal (EF 59%)   COLONOSCOPY  2008; 06/14/16   2008 Normal.  2018 hyperplastic polyp  +internal hemorrhoids.  Recall 2028.   CYSTOSCOPY WITH INSERTION OF UROLIFT N/A 10/04/2021   Procedure: CYSTOSCOPY WITH INSERTION OF UROLIFT;  Surgeon: Malen Gauze, MD;  Location: [facility];  Service: Urology;  Laterality: N/A;   HYDROCELE EXCISION / REPAIR  early 2000's   INGUINAL HERNIA REPAIR Bilateral 1998   PROSTATE BIOPSY     Multiple; all neg.  Prostate MRI 2018 -NO SIGN OF PROSTATE CA.Marland Kitchen Rpt bx 12/2022->results pending as of 12/22/22   PTCA     Cath 2011---4 stents--LAD and diagonal.  2014 repeat cath for CP showed no changes.   ROBOT ASSISTED LAPAROSCOPIC RADICAL PROSTATECTOMY  02/27/2023   [name], [name], MD   Scrotal u/s  01/2015   numerous extratesticular cystic lesions bilat, c/w numerous epididymal cysts vs loculated hydrocele--following expectantly.    MEDS:  Outpatient Medications Prior to Visit  Medication Sig Dispense Refill   aspirin EC 81 MG tablet Take 81 mg by mouth daily.     atorvastatin (LIPITOR) 40 MG tablet Take 1 tablet (40 mg total) by mouth daily. 90 tablet 3   carvedilol (COREG) 6.25 MG tablet Take 1 tablet (6.25 mg total) by mouth 2 (two) times daily. 180 tablet 3   lisinopril (ZESTRIL) 5 MG tablet Take 1 tablet (5 mg total) by mouth daily. 90 tablet 3   melatonin 5 MG TABS Take 5 mg by mouth at bedtime.     Multiple Vitamins-Minerals (MULTIVITAMIN ADULT PO) Take 1 capsule by mouth daily.     nitroGLYCERIN (NITROLINGUAL) 0.4 MG/SPRAY spray Place 1 spray under the tongue every 5 (five) minutes x 3 doses as needed for chest pain. 12 g 3   omeprazole (PRILOSEC) 20 MG capsule Take 20 mg by mouth daily.     ticagrelor (BRILINTA) 60 MG TABS tablet Take 1 tablet (60 mg total) by mouth 2 (two) times daily. 180 tablet 3   No facility-administered medications prior to visit.    Physical Exam    03/13/2023   10:37 AM 03/08/2023   10:36 AM 02/17/2023   12:51 PM  Vitals with BMI  Weight 164 lbs 3 oz 166 lbs   BMI  24.5   Systolic 112  158  Diastolic 72   69  Pulse 54  60    Gen: Alert, well appearing.  Patient is oriented to person, place, time, and situation. AFFECT: pleasant, lucid thought and speech. No further exam today   Pertinent labs/imaging Lab Results  Component Value Date   TSH 1.25 12/21/2021   Lab Results  Component Value Date   WBC 7.2 12/22/2022   HGB 14.3 12/22/2022   HCT 43.3 12/22/2022   MCV 92.7 12/22/2022   PLT 199.0 12/22/2022   Lab Results  Component Value Date   CREATININE 0.97 12/22/2022   BUN 16 12/22/2022   NA 139 12/22/2022   K 4.8 12/22/2022   CL 102 12/22/2022   CO2 30 12/22/2022   Lab Results  Component Value Date   ALT 24 12/22/2022   AST 24 12/22/2022   ALKPHOS 63 12/22/2022   BILITOT 1.0 12/22/2022  Lab Results  Component Value Date   HGBA1C 5.7 12/22/2022   ASSESSMENT/PLAN:  Prostate ca, pt post op radical prostatectomy, path showed no LN involvement. Pt very happy/relieved, although he is concerned about the iliac aneurism noted on op report. He'll try to get more details when he returns to [facility] for f/u, but in the meantime I'll go ahead and do imaging to eval abd aorta and branch vessels to further investigate. No repeat labs needed today.  Medical decision making of moderate complexity was utilized today.  FOLLOW UP:  keep f/u set for 06/26/23  Signed:  Santiago Bumpers, MD           03/13/2023

## 2023-03-14 ENCOUNTER — Encounter: Payer: Self-pay | Admitting: Urology

## 2023-03-15 ENCOUNTER — Other Ambulatory Visit: Payer: Medicare Other

## 2023-03-21 ENCOUNTER — Encounter: Payer: Self-pay | Admitting: Urology

## 2023-03-22 ENCOUNTER — Ambulatory Visit: Payer: Medicare Other | Admitting: Urology

## 2023-03-22 NOTE — Telephone Encounter (Signed)
Please see below.

## 2023-03-26 ENCOUNTER — Encounter: Payer: Self-pay | Admitting: Family Medicine

## 2023-04-07 ENCOUNTER — Encounter: Payer: Self-pay | Admitting: Family Medicine

## 2023-04-25 ENCOUNTER — Encounter: Payer: Self-pay | Admitting: Urology

## 2023-04-28 ENCOUNTER — Ambulatory Visit (HOSPITAL_COMMUNITY)
Admission: RE | Admit: 2023-04-28 | Discharge: 2023-04-28 | Disposition: A | Discharge status: Home / Self Care | Payer: Medicare Other | Source: Ambulatory Visit | Attending: Cardiovascular Disease | Admitting: Cardiovascular Disease

## 2023-04-28 DIAGNOSIS — I723 Aneurysm of iliac artery: Secondary | ICD-10-CM

## 2023-05-01 ENCOUNTER — Encounter: Payer: Self-pay | Admitting: Family Medicine

## 2023-05-10 ENCOUNTER — Encounter: Payer: Self-pay | Admitting: Cardiology

## 2023-05-10 ENCOUNTER — Encounter: Payer: Self-pay | Admitting: Urology

## 2023-05-10 ENCOUNTER — Telehealth: Payer: Self-pay

## 2023-05-10 NOTE — Telephone Encounter (Signed)
..     Pre-operative Risk Assessment    Patient Name: Ricky Chavez  DOB: 1955/08/03 MRN: [id number]      Request for Surgical Clearance    Procedure:   RIGHT THUMB CARPOMETACARPAL JOINT ARTHROPLASTY WITH TENDON RELEASE  Date of Surgery:  Clearance TBD                                 Surgeon:  DR Gomez Cleverly Surgeon's Group or Practice Name:  [facility] Phone number:  [phone] Fax number:  [phone]   Type of Clearance Requested:   - Medical  - Pharmacy:  Hold Aspirin and Ticagrelor (Brilinta)     Type of Anesthesia:  Not Indicated   Additional requests/questions:   LAST O/V 02/08/23. NO NEW APPT  Signed, Renee Ramus   05/10/2023, 12:20 PM

## 2023-05-10 NOTE — Telephone Encounter (Addendum)
   Name: Ricky Chavez  DOB: 07-Oct-1955  MRN: [id number]  Primary Cardiologist: Rollene Rotunda, MD   Preoperative team, please contact this patient and set up a phone call appointment for further preoperative risk assessment. Please obtain consent and complete medication review. Thank you for your help.  Patient has previously been given the OK to hold Aspirin and Brilinta for 5 day and has had no new cardiac intervention since then. Therefore, OK to use this same recommendation.   I also confirmed the patient resides in the state of [place]. As per [facility] Medical Board telemedicine laws, the patient must reside in the state in which the provider is licensed.   [name], PA-C 05/10/2023, 12:48 PM [facility]  ADDENDUM:  Patient was previously given the OK to hold Brilinta for 5 days but Dr. Antoine Poche previously recommended continuing Aspirin perioperatively. This was in 09/2021 prior to a Urologic procedure. Therefore, OK to hold Brilinta but would continue Aspirin if possible.  [name], PA-C 05/10/2023 12:56 PM

## 2023-05-11 ENCOUNTER — Other Ambulatory Visit: Payer: Self-pay

## 2023-05-11 ENCOUNTER — Other Ambulatory Visit: Payer: Medicare Other

## 2023-05-11 DIAGNOSIS — C61 Malignant neoplasm of prostate: Secondary | ICD-10-CM

## 2023-05-11 DIAGNOSIS — R972 Elevated prostate specific antigen [PSA]: Secondary | ICD-10-CM

## 2023-05-12 ENCOUNTER — Telehealth: Payer: Self-pay

## 2023-05-12 LAB — PSA: Prostate Specific Ag, Serum: <0.1 ng/mL (ref 0.0–4.0)

## 2023-05-12 NOTE — Telephone Encounter (Signed)
Spoke with patient who is agreeable to do a tele visit on 1/8 at 9:20 am. Consent given, med rec to be completed.

## 2023-05-12 NOTE — Telephone Encounter (Signed)
  Patient Consent for Virtual Visit        Ricky Chavez has provided verbal consent on 05/12/2023 for a virtual visit (video or telephone).   CONSENT FOR VIRTUAL VISIT FOR:  Ricky Chavez  By participating in this virtual visit I agree to the following:  I hereby voluntarily request, consent and authorize [facility] and its employed or contracted physicians, physician assistants, nurse practitioners or other licensed health care professionals (the Practitioner), to provide me with telemedicine health care services (the "Services") as deemed necessary by the treating Practitioner. I acknowledge and consent to receive the Services by the Practitioner via telemedicine. I understand that the telemedicine visit will involve communicating with the Practitioner through live audiovisual communication technology and the disclosure of certain medical information by electronic transmission. I acknowledge that I have been given the opportunity to request an in-person assessment or other available alternative prior to the telemedicine visit and am voluntarily participating in the telemedicine visit.  I understand that I have the right to withhold or withdraw my consent to the use of telemedicine in the course of my care at any time, without affecting my right to future care or treatment, and that the Practitioner or I may terminate the telemedicine visit at any time. I understand that I have the right to inspect all information obtained and/or recorded in the course of the telemedicine visit and may receive copies of available information for a reasonable fee.  I understand that some of the potential risks of receiving the Services via telemedicine include:  Delay or interruption in medical evaluation due to technological equipment failure or disruption; Information transmitted may not be sufficient (e.g. poor resolution of images) to allow for appropriate medical decision making by the  Practitioner; and/or  In rare instances, security protocols could fail, causing a breach of personal health information.  Furthermore, I acknowledge that it is my responsibility to provide information about my medical history, conditions and care that is complete and accurate to the best of my ability. I acknowledge that Practitioner's advice, recommendations, and/or decision may be based on factors not within their control, such as incomplete or inaccurate data provided by me or distortions of diagnostic images or specimens that may result from electronic transmissions. I understand that the practice of medicine is not an exact science and that Practitioner makes no warranties or guarantees regarding treatment outcomes. I acknowledge that a copy of this consent can be made available to me via my patient portal [facility] MyChart), or I can request a printed copy by calling the office of [facility].    I understand that my insurance will be billed for this visit.   I have read or had this consent read to me. I understand the contents of this consent, which adequately explains the benefits and risks of the Services being provided via telemedicine.  I have been provided ample opportunity to ask questions regarding this consent and the Services and have had my questions answered to my satisfaction. I give my informed consent for the services to be provided through the use of telemedicine in my medical care

## 2023-05-17 ENCOUNTER — Encounter: Payer: Self-pay | Admitting: Urology

## 2023-05-17 ENCOUNTER — Ambulatory Visit: Payer: Medicare Other | Admitting: Urology

## 2023-05-17 VITALS — BP 105/62 | HR 59

## 2023-05-17 DIAGNOSIS — N5231 Erectile dysfunction following radical prostatectomy: Secondary | ICD-10-CM

## 2023-05-17 DIAGNOSIS — C61 Malignant neoplasm of prostate: Secondary | ICD-10-CM

## 2023-05-17 DIAGNOSIS — Z8546 Personal history of malignant neoplasm of prostate: Secondary | ICD-10-CM | POA: Diagnosis not present

## 2023-05-17 LAB — URINALYSIS, ROUTINE W REFLEX MICROSCOPIC
Bilirubin, UA: NEGATIVE
Glucose, UA: NEGATIVE
Ketones, UA: NEGATIVE
Leukocytes,UA: NEGATIVE
Nitrite, UA: NEGATIVE
Protein,UA: NEGATIVE
RBC, UA: NEGATIVE
Specific Gravity, UA: 1.015 (ref 1.005–1.030)
Urobilinogen, Ur: 0.2 mg/dL (ref 0.2–1.0)
pH, UA: 6 (ref 5.0–7.5)

## 2023-05-17 NOTE — Progress Notes (Signed)
05/17/2023 9:17 AM   Ricky Chavez Nov 23, 1955 [id number]  Referring provider: Jeoffrey Massed, MD [address] [place],  [place] [address]  Followup prostate cancer   HPI: Ricky Chavez is a 20JY here for followup for prostate cancer and erectile dysfunction. He underwent prostatectomy in 02/2023. Incontinence has improved and he is no longer wearing pads. He has ED and is taking daily tadalafil 5mg  daily. PSA is undetectable.    PMH: Past Medical History:  Diagnosis Date   Allergic rhinitis    BPH with obstruction/lower urinary tract symptoms    flomax and finasteride qod per urol   Coronary artery disease    With preserved EF.  Needs DAPT lifetime due to high risk status.   COVID 09/01/2020   Elevated PSA onset @ 2009   Neg bx x 3.  MRI c/w bph only.  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   GERD (gastroesophageal reflux disease)    Hearing loss of both ears    History of meniscal tear 2023   L knee   History of myocardial infarction 2011   Hyperlipidemia    Hypertension    Insomnia    Myocardial infarction [facility])    Organic impotence    Palpitations 05/2018   PVCs   Prostate cancer [facility])    radical prostatectomy 03/2023   Sciatica    Piriformis syndrome likely--improving with gabapentin as of 04/2017.  May need MRI L spine and/or emg/NCS   Squamous cell carcinoma     Surgical History: Past Surgical History:  Procedure Laterality Date   AAA screening     normal 04/2023   CARDIAC CATHETERIZATION  01/31/2013   Stents to LAD patent; small diagonal stent with 90% ostial stenosis--good overall LV function with mild anterior and focal inferoapical hypokinesis, EF 60-65%.  Unsuccessful angioplasty of diagonal.   CARDIOVASCULAR STRESS TEST  05/26/2015   stress echo neg for ischemia   CARDIOVASCULAR STRESS TEST  10/04/2017   Myoc perf imaging: normal (EF 59%)   COLONOSCOPY  2008; 06/14/16   2008 Normal.  2018 hyperplastic polyp +internal  hemorrhoids.  Recall 2028.   CYSTOSCOPY WITH INSERTION OF UROLIFT N/A 10/04/2021   Procedure: CYSTOSCOPY WITH INSERTION OF UROLIFT;  Surgeon: Malen Gauze, MD;  Location: [facility];  Service: Urology;  Laterality: N/A;   HYDROCELE EXCISION / REPAIR  early 2000's   INGUINAL HERNIA REPAIR Bilateral 1998   PROSTATE BIOPSY     Multiple; all neg.  Prostate MRI 2018 -NO SIGN OF PROSTATE CA.Marland Kitchen Rpt bx 12/2022->results pending as of 12/22/22   PTCA     Cath 2011---4 stents--LAD and diagonal.  2014 repeat cath for CP showed no changes.   ROBOT ASSISTED LAPAROSCOPIC RADICAL PROSTATECTOMY  02/27/2023   [name], [name], MD   Scrotal u/s  01/2015   numerous extratesticular cystic lesions bilat, c/w numerous epididymal cysts vs loculated hydrocele--following expectantly.    Home Medications:  Allergies as of 05/17/2023       Reactions   Cephalexin Other (See Comments)   Cause the patient to develop Cdiff         Medication List        Accurate as of May 17, 2023  9:17 AM. If you have any questions, ask your nurse or doctor.          aspirin EC 81 MG tablet Take 81 mg by mouth daily.   atorvastatin 40 MG tablet Commonly known as: LIPITOR Take 1  tablet (40 mg total) by mouth daily.   carvedilol 6.25 MG tablet Commonly known as: Coreg Take 1 tablet (6.25 mg total) by mouth 2 (two) times daily.   lisinopril 5 MG tablet Commonly known as: ZESTRIL Take 1 tablet (5 mg total) by mouth daily.   melatonin 5 MG Tabs Take 5 mg by mouth at bedtime.   MULTIVITAMIN ADULT PO Take 1 capsule by mouth daily.   nitroGLYCERIN 0.4 MG/SPRAY spray Commonly known as: NITROLINGUAL Place 1 spray under the tongue every 5 (five) minutes x 3 doses as needed for chest pain.   omeprazole 20 MG capsule Commonly known as: PRILOSEC Take 20 mg by mouth daily.   polyethylene glycol 17 g packet Commonly known as: MIRALAX / GLYCOLAX Take 17 g by mouth daily.   tadalafil 5 MG tablet Commonly  known as: CIALIS Take one 5 mg tablet once daily at approximately the same time daily without regard to timing of sexual activity.   ticagrelor 60 MG Tabs tablet Commonly known as: Brilinta Take 1 tablet (60 mg total) by mouth 2 (two) times daily.        Allergies:  Allergies  Allergen Reactions   Cephalexin Other (See Comments)    Cause the patient to develop Cdiff     Family History: Family History  Problem Relation Age of Onset   Hyperlipidemia Mother    Hypertension Mother    Heart disease Mother 20       CABG   Other Mother        Neuroendocrine carcinoma pancreas (solid mass) and liver (diffuse)   Heart disease Father 73       CABG   Hyperlipidemia Father    Hypertension Father    Colon polyps Father    Hypothyroidism Sister    Heart attack Brother 45       MI   Prostate cancer Paternal Uncle    Peripheral vascular disease Brother        Carotid    Stroke Neg Hx    Diabetes Neg Hx     Social History:  reports that he quit smoking about 44 years ago. His smoking use included cigarettes. He started smoking about 46 years ago. He has a 0.5 pack-year smoking history. He has never used smokeless tobacco. He reports current alcohol use of about 14.0 standard drinks of alcohol per week. He reports that he does not use drugs.  ROS: All other review of systems were reviewed and are negative except what is noted above in HPI  Physical Exam: BP 105/62   Pulse (!) 59   Constitutional:  Alert and oriented, No acute distress. HEENT: [place] AT, moist mucus membranes.  Trachea midline, no masses. Cardiovascular: No clubbing, cyanosis, or edema. Respiratory: Normal respiratory effort, no increased work of breathing. GI: Abdomen is soft, nontender, nondistended, no abdominal masses GU: No CVA tenderness.  Lymph: No cervical or inguinal lymphadenopathy. Skin: No rashes, bruises or suspicious lesions. Neurologic: Grossly intact, no focal deficits, moving all 4  extremities. Psychiatric: Normal mood and affect.  Laboratory Data: Lab Results  Component Value Date   WBC 7.2 12/22/2022   HGB 14.3 12/22/2022   HCT 43.3 12/22/2022   MCV 92.7 12/22/2022   PLT 199.0 12/22/2022    Lab Results  Component Value Date   CREATININE 0.97 12/22/2022    Lab Results  Component Value Date   PSA 6.62 (H) 12/21/2021   PSA 4.4 (H) 11/25/2019   PSA 5.20 11/20/2019  Lab Results  Component Value Date   TESTOSTERONE 414 11/25/2019    Lab Results  Component Value Date   HGBA1C 5.7 12/22/2022    Urinalysis    Component Value Date/Time   APPEARANCEUR Clear 02/17/2023 1251   GLUCOSEU Negative 02/17/2023 1251   BILIRUBINUR Negative 02/17/2023 1251   PROTEINUR Negative 02/17/2023 1251   NITRITE Negative 02/17/2023 1251   LEUKOCYTESUR Negative 02/17/2023 1251    Lab Results  Component Value Date   LABMICR Comment 02/17/2023   WBCUA >30 (A) 01/17/2022   LABEPIT 0-10 01/17/2022   BACTERIA Many (A) 01/17/2022    Pertinent Imaging:  No results found for this or any previous visit.  No results found for this or any previous visit.  No results found for this or any previous visit.  No results found for this or any previous visit.  No results found for this or any previous visit.  No valid procedures specified. No results found for this or any previous visit.  No results found for this or any previous visit.   Assessment & Plan:    1. Prostate cancer (HCC) Followup 3 months with PSA - Urinalysis, Routine w reflex microscopic  2. Erectile dysfunction after radical prostatectomy Continue tadalafil 5mg  daily   No follow-ups on file.  Wilkie Aye, MD  [facility] [place]

## 2023-05-17 NOTE — Patient Instructions (Signed)

## 2023-06-14 ENCOUNTER — Ambulatory Visit: Payer: Medicare Other

## 2023-06-19 ENCOUNTER — Encounter: Payer: Self-pay | Admitting: Family Medicine

## 2023-06-19 NOTE — Telephone Encounter (Signed)
 Yes, will do

## 2023-06-26 ENCOUNTER — Encounter: Payer: Self-pay | Admitting: Family Medicine

## 2023-06-26 ENCOUNTER — Ambulatory Visit (INDEPENDENT_AMBULATORY_CARE_PROVIDER_SITE_OTHER): Payer: Medicare Other | Admitting: Family Medicine

## 2023-06-26 VITALS — BP 97/63 | HR 55 | Wt 170.8 lb

## 2023-06-26 DIAGNOSIS — I10 Essential (primary) hypertension: Secondary | ICD-10-CM

## 2023-06-26 DIAGNOSIS — E7800 Pure hypercholesterolemia, unspecified: Secondary | ICD-10-CM

## 2023-06-26 DIAGNOSIS — R7303 Prediabetes: Secondary | ICD-10-CM

## 2023-06-26 LAB — COMPREHENSIVE METABOLIC PANEL
ALT: 21 U/L (ref 0–53)
AST: 22 U/L (ref 0–37)
Albumin: 4.4 g/dL (ref 3.5–5.2)
Alkaline Phosphatase: 61 U/L (ref 39–117)
BUN: 16 mg/dL (ref 6–23)
CO2: 30 meq/L (ref 19–32)
Calcium: 9.5 mg/dL (ref 8.4–10.5)
Chloride: 104 meq/L (ref 96–112)
Creatinine, Ser: 1.02 mg/dL (ref 0.40–1.50)
GFR: 76.68 mL/min (ref >60.00)
Glucose, Bld: 114 mg/dL — ABNORMAL HIGH (ref 70–99)
Potassium: 4.8 meq/L (ref 3.5–5.1)
Sodium: 140 meq/L (ref 135–145)
Total Bilirubin: 0.7 mg/dL (ref 0.2–1.2)
Total Protein: 6.8 g/dL (ref 6.0–8.3)

## 2023-06-26 LAB — LIPID PANEL
Cholesterol: 124 mg/dL (ref 0–200)
HDL: 42.4 mg/dL (ref >39.00)
LDL Cholesterol: 65 mg/dL (ref 0–99)
NonHDL: 81.59
Total CHOL/HDL Ratio: 3
Triglycerides: 83 mg/dL (ref 0.0–149.0)
VLDL: 16.6 mg/dL (ref 0.0–40.0)

## 2023-06-26 LAB — HEMOGLOBIN A1C: Hgb A1c MFr Bld: 5.9 % (ref 4.6–6.5)

## 2023-06-26 NOTE — Progress Notes (Signed)
OFFICE VISIT  06/26/2023  CC:  Chief Complaint  Patient presents with   Medical Management of Chronic Issues    Pt is fasting.     Patient is a 68 y.o. male who presents for 70-month follow-up hypertension, hyperlipidemia, and prediabetes. A/P as of last visit: "#1 hypertension, well-controlled on lisinopril 5 mg a day and carvedilol 6.25 mg twice daily. Electrolytes and creatinine today.   #2  Hypercholesterolemia.  Doing well on Lipitor 40 mg a day. LDL goal less than 70. LDL was 73 about 1 year ago. Lipid panel and hepatic panel today."  INTERIM HX: Feeling well. BP at home consistently in low-normal range, HR 60 or so. No dizziness or fatigue, no SOB/DOE, no CP.  Eating healthy, staying active.  Getting over a head cold lately.  ROS as above, plus--> no fevers, no wheezing, no cough, no HAs, no rashes, no melena/hematochezia.  No polyuria or polydipsia.  No myalgias or arthralgias.  No focal weakness, paresthesias, or tremors.  No acute vision or hearing abnormalities.  No dysuria or unusual/new urinary urgency or frequency.  No recent changes in lower legs. No n/v/d or abd pain.  No palpitations.    Past Medical History:  Diagnosis Date   Allergic rhinitis    BPH with obstruction/lower urinary tract symptoms    flomax and finasteride qod per urol   Coronary artery disease    With preserved EF.  Needs DAPT lifetime due to high risk status.   COVID 09/01/2020   Elevated PSA onset @ 2009   Neg bx x 3.  MRI c/w bph only.  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   GERD (gastroesophageal reflux disease)    Hearing loss of both ears    History of meniscal tear 2023   L knee   History of myocardial infarction 2011   Hyperlipidemia    Hypertension    Insomnia    Myocardial infarction [facility])    Organic impotence    Palpitations 05/2018   PVCs   Prostate cancer [facility])    radical prostatectomy 03/2023   Sciatica    Piriformis syndrome likely--improving  with gabapentin as of 04/2017.  May need MRI L spine and/or emg/NCS   Squamous cell carcinoma     Past Surgical History:  Procedure Laterality Date   AAA screening     normal 04/2023   CARDIAC CATHETERIZATION  01/31/2013   Stents to LAD patent; small diagonal stent with 90% ostial stenosis--good overall LV function with mild anterior and focal inferoapical hypokinesis, EF 60-65%.  Unsuccessful angioplasty of diagonal.   CARDIOVASCULAR STRESS TEST  05/26/2015   stress echo neg for ischemia   CARDIOVASCULAR STRESS TEST  10/04/2017   Myoc perf imaging: normal (EF 59%)   COLONOSCOPY  2008; 06/14/16   2008 Normal.  2018 hyperplastic polyp +internal hemorrhoids.  Recall 2028.   CYSTOSCOPY WITH INSERTION OF UROLIFT N/A 10/04/2021   Procedure: CYSTOSCOPY WITH INSERTION OF UROLIFT;  Surgeon: Malen Gauze, MD;  Location: [facility];  Service: Urology;  Laterality: N/A;   HYDROCELE EXCISION / REPAIR  early 2000's   INGUINAL HERNIA REPAIR Bilateral 1998   PROSTATE BIOPSY     Multiple; all neg.  Prostate MRI 2018 -NO SIGN OF PROSTATE CA.Marland Kitchen Rpt bx 12/2022->results pending as of 12/22/22   PTCA     Cath 2011---4 stents--LAD and diagonal.  2014 repeat cath for CP showed no changes.   ROBOT ASSISTED LAPAROSCOPIC RADICAL PROSTATECTOMY  02/27/2023   [name], Ashok  Lucianne Muss, MD   Scrotal u/s  01/2015   numerous extratesticular cystic lesions bilat, c/w numerous epididymal cysts vs loculated hydrocele--following expectantly.    Outpatient Medications Prior to Visit  Medication Sig Dispense Refill   aspirin EC 81 MG tablet Take 81 mg by mouth daily.     atorvastatin (LIPITOR) 40 MG tablet Take 1 tablet (40 mg total) by mouth daily. 90 tablet 3   carvedilol (COREG) 6.25 MG tablet Take 1 tablet (6.25 mg total) by mouth 2 (two) times daily. 180 tablet 3   lisinopril (ZESTRIL) 5 MG tablet Take 1 tablet (5 mg total) by mouth daily. 90 tablet 3   melatonin 5 MG TABS Take 5 mg by mouth at bedtime.     Multiple  Vitamins-Minerals (MULTIVITAMIN ADULT PO) Take 1 capsule by mouth daily.     nitroGLYCERIN (NITROLINGUAL) 0.4 MG/SPRAY spray Place 1 spray under the tongue every 5 (five) minutes x 3 doses as needed for chest pain. 12 g 3   omeprazole (PRILOSEC) 20 MG capsule Take 20 mg by mouth daily.     polyethylene glycol (MIRALAX / GLYCOLAX) 17 g packet Take 17 g by mouth daily.     tadalafil (CIALIS) 5 MG tablet Take one 5 mg tablet once daily at approximately the same time daily without regard to timing of sexual activity.     ticagrelor (BRILINTA) 60 MG TABS tablet Take 1 tablet (60 mg total) by mouth 2 (two) times daily. 180 tablet 3   No facility-administered medications prior to visit.    Allergies  Allergen Reactions   Cephalexin Other (See Comments)    Cause the patient to develop Cdiff     Review of Systems As per HPI  PE:    06/26/2023    9:24 AM 05/17/2023    9:06 AM 03/13/2023   10:37 AM  Vitals with BMI  Weight 170 lbs 13 oz  164 lbs 3 oz  Systolic 97 105 112  Diastolic 63 62 72  Pulse 55 59 54     Physical Exam  Gen: Alert, well appearing.  Patient is oriented to person, place, time, and situation. CV: RRR, no m/r/g.   LUNGS: CTA bilat, nonlabored resps, good aeration in all lung fields. EXT: no clubbing or cyanosis.  no edema.    LABS:  Last CBC Lab Results  Component Value Date   WBC 7.2 12/22/2022   HGB 14.3 12/22/2022   HCT 43.3 12/22/2022   MCV 92.7 12/22/2022   MCH 31.0 12/21/2021   RDW 13.4 12/22/2022   PLT 199.0 12/22/2022   Last metabolic panel Lab Results  Component Value Date   GLUCOSE 112 (H) 12/22/2022   NA 139 12/22/2022   K 4.8 12/22/2022   CL 102 12/22/2022   CO2 30 12/22/2022   BUN 16 12/22/2022   CREATININE 0.97 12/22/2022   GFR 81.73 12/22/2022   CALCIUM 10.0 12/22/2022   PROT 6.6 12/22/2022   ALBUMIN 4.3 12/22/2022   BILITOT 1.0 12/22/2022   ALKPHOS 63 12/22/2022   AST 24 12/22/2022   ALT 24 12/22/2022   Last lipids Lab  Results  Component Value Date   CHOL 124 12/22/2022   HDL 48.70 12/22/2022   LDLCALC 50 12/22/2022   TRIG 127.0 12/22/2022   CHOLHDL 3 12/22/2022   Last hemoglobin A1c Lab Results  Component Value Date   HGBA1C 5.7 12/22/2022   Last thyroid functions Lab Results  Component Value Date   TSH 1.25 12/21/2021   IMPRESSION AND  PLAN:  No problem-specific Assessment & Plan notes found for this encounter.  1 hypertension, well-controlled on lisinopril 5 mg a day and carvedilol 6.25 mg twice daily. Electrolytes and creatinine today.   #2  Hypercholesterolemia.  Doing well on Lipitor 40 mg a day. LDL goal less than 70. LDL was 50 about 6 mo ago. Lipid panel, LP(a), and hepatic panel today. Will forward results to Dr. Antoine Poche to make him aware.  3) Prediabetes, Hba1c 5.7% about 6 mo ago. Hba1c today.  An After Visit Summary was printed and given to the patient.  FOLLOW UP: Return in about 6 months (around 12/24/2023) for routine chronic illness f/u.  Signed:  Santiago Bumpers, MD           06/26/2023

## 2023-06-28 ENCOUNTER — Encounter: Payer: Self-pay | Admitting: Family Medicine

## 2023-06-30 LAB — LIPOPROTEIN A (LPA): Lipoprotein (a): 324 nmol/L — ABNORMAL HIGH (ref <75)

## 2023-07-02 ENCOUNTER — Encounter: Payer: Self-pay | Admitting: Family Medicine

## 2023-07-03 ENCOUNTER — Other Ambulatory Visit (HOSPITAL_COMMUNITY): Payer: Self-pay

## 2023-07-03 ENCOUNTER — Encounter: Payer: Self-pay | Admitting: Cardiology

## 2023-07-03 ENCOUNTER — Telehealth: Payer: Self-pay | Admitting: Pharmacy Technician

## 2023-07-03 NOTE — Telephone Encounter (Signed)
Pharmacy Patient Advocate Encounter   Received notification from Pt Calls Messages that prior authorization for Repatha SureClick 140MG /ML auto-injectors is required/requested.   Insurance verification completed.   The patient is insured through [facility] .   Per test claim: PA required; PA submitted to above mentioned insurance via CoverMyMeds Key/confirmation #/EOC [id number] Status is pending   Received: Both rejected needing a prior auth, repatha preferred per reject   PA request has been Submitted. New Encounter created for follow up. For additional info see Pharmacy Prior Auth telephone encounter from 07/03/23. July 02, 2023 Cheree Ditto, [facility] to Rx Prior Auth Team     07/02/23  1:39 PM  Please complete PA for Repatha/Praluent Cheree Ditto, RPH to Rollene Rotunda, MD  Milinda Cave Maryjean Morn, MD  Delton Prairie, RN     07/02/23  1:39 PM  We will run a prior authorization for you and let you know

## 2023-07-04 ENCOUNTER — Other Ambulatory Visit (HOSPITAL_COMMUNITY): Payer: Self-pay

## 2023-07-04 NOTE — Telephone Encounter (Signed)
Pharmacy Patient Advocate Encounter  Received notification from [facility] that Prior Authorization for Repatha SureClick 140MG /ML auto-injectors has been APPROVED from 07/03/23 to 12/31/23. Ran test claim, Copay is $47.00. This test claim was processed through [facility]- copay amounts may vary at other pharmacies due to pharmacy/plan contracts, or as the patient moves through the different stages of their insurance plan.   PA #/Case ID/Reference #: [id number]

## 2023-07-06 ENCOUNTER — Encounter: Payer: Self-pay | Admitting: Family Medicine

## 2023-07-06 DIAGNOSIS — R7303 Prediabetes: Secondary | ICD-10-CM

## 2023-07-07 NOTE — Telephone Encounter (Signed)
 Okay, referral ordered

## 2023-07-10 ENCOUNTER — Telehealth: Payer: Self-pay

## 2023-07-10 ENCOUNTER — Telehealth: Payer: Self-pay | Admitting: Pharmacist

## 2023-07-10 NOTE — Telephone Encounter (Signed)
Hello,   Patient will be scheduled as soon as possible.  Auth Submission: NO AUTH NEEDED Site of care: Site of care: [facility] Payer: medicare a/b, bcbs supp Medication & CPT/J Code(s) submitted: Leqvio (Inclisiran) J1306 Route of submission (phone, fax, portal): portal Phone # Fax # Auth type: Buy/Bill HB Units/visits requested: 284mg , q51months x 2 doses, then q61months Reference number:  Approval from: 07/10/23 to 06/05/24

## 2023-07-10 NOTE — Telephone Encounter (Signed)
Called patient to advise that Ricky Chavez is approved. Will place lab orders to [facility] infusion center

## 2023-07-12 ENCOUNTER — Encounter: Payer: Medicare Other | Attending: Cardiology | Admitting: *Deleted

## 2023-07-12 VITALS — BP 115/65 | HR 58 | Resp 16

## 2023-07-12 DIAGNOSIS — Z79899 Other long term (current) drug therapy: Secondary | ICD-10-CM | POA: Insufficient documentation

## 2023-07-12 DIAGNOSIS — I25810 Atherosclerosis of coronary artery bypass graft(s) without angina pectoris: Secondary | ICD-10-CM | POA: Diagnosis present

## 2023-07-12 DIAGNOSIS — E785 Hyperlipidemia, unspecified: Secondary | ICD-10-CM | POA: Insufficient documentation

## 2023-07-12 MED ORDER — INCLISIRAN SODIUM 284 MG/1.5ML ~~LOC~~ SOSY
284.0000 mg | PREFILLED_SYRINGE | Freq: Once | SUBCUTANEOUS | Status: AC
  Administered 2023-07-12: 284 mg via SUBCUTANEOUS

## 2023-07-12 NOTE — Progress Notes (Signed)
 Diagnosis: Hyperlipidemia  Provider:  Lynwood Schilling, MD  Procedure: Injection  Leqvio  (inclisiran), Dose: 284 mg, Site: subcutaneous, Number of injections: 1  Injection Site(s): Left upper quad. abdomen  Post Care: Observation period completed  Discharge: Condition: Good, Destination: Home . AVS Provided  Performed by:  Baldwin Darice Helling, RN

## 2023-08-08 ENCOUNTER — Encounter: Payer: Self-pay | Admitting: Family Medicine

## 2023-08-08 DIAGNOSIS — K429 Umbilical hernia without obstruction or gangrene: Secondary | ICD-10-CM | POA: Insufficient documentation

## 2023-08-10 ENCOUNTER — Ambulatory Visit (INDEPENDENT_AMBULATORY_CARE_PROVIDER_SITE_OTHER): Admitting: Family Medicine

## 2023-08-10 ENCOUNTER — Encounter: Payer: Self-pay | Admitting: Family Medicine

## 2023-08-10 VITALS — BP 111/70 | HR 48 | Ht 69.0 in | Wt 168.8 lb

## 2023-08-10 DIAGNOSIS — K429 Umbilical hernia without obstruction or gangrene: Secondary | ICD-10-CM

## 2023-08-10 NOTE — Progress Notes (Signed)
 OFFICE VISIT  08/10/2023  CC:  Chief Complaint  Patient presents with   Skin Concern    Pt believes he may have a hernia, first noticed 2-3 days ago    Patient is a 68 y.o. male who presents for concern of hernia.  HPI: He notes a protrusion intermittently in the area just above the umbilical region. He first noted it a couple days ago after going to the bathroom.  He also works out with [employer] regularly.  He has no pain.  He is eating and drinking well and bowel movements are normal.  He has a history of bilateral inguinal hernia repair 1998.   Past Medical History:  Diagnosis Date   Allergic rhinitis    BPH with obstruction/lower urinary tract symptoms    flomax and finasteride qod per urol   Coronary artery disease    With preserved EF.  Needs DAPT lifetime due to high risk status.   Elevated PSA onset @ 2009   Neg bx x 3.  MRI c/w bph only.  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   GERD (gastroesophageal reflux disease)    Hearing loss of both ears    History of meniscal tear 2023   L knee   History of myocardial infarction 2011   Hyperlipidemia    very elevated lipoprotein a   Hypertension    Insomnia    Organic impotence    Palpitations 05/2018   PVCs   Prediabetes    fasting gluc 114 and Hba1c 5.9% Jan 2025   Prostate cancer [facility])    radical prostatectomy 03/2023   Sciatica    Piriformis syndrome likely--improving with gabapentin as of 04/2017.  May need MRI L spine and/or emg/NCS   Squamous cell carcinoma     Past Surgical History:  Procedure Laterality Date   AAA screening     normal 04/2023   CARDIAC CATHETERIZATION  01/31/2013   Stents to LAD patent; small diagonal stent with 90% ostial stenosis--good overall LV function with mild anterior and focal inferoapical hypokinesis, EF 60-65%.  Unsuccessful angioplasty of diagonal.   CARDIOVASCULAR STRESS TEST  05/26/2015   stress echo neg for ischemia   CARDIOVASCULAR STRESS TEST   10/04/2017   Myoc perf imaging: normal (EF 59%)   COLONOSCOPY  2008; 06/14/16   2008 Normal.  2018 hyperplastic polyp +internal hemorrhoids.  Recall 2028.   CYSTOSCOPY WITH INSERTION OF UROLIFT N/A 10/04/2021   Procedure: CYSTOSCOPY WITH INSERTION OF UROLIFT;  Surgeon: Malen Gauze, MD;  Location: [facility];  Service: Urology;  Laterality: N/A;   HYDROCELE EXCISION / REPAIR  early 2000's   INGUINAL HERNIA REPAIR Bilateral 1998   PROSTATE BIOPSY     Multiple; all neg.  Prostate MRI 2018 -NO SIGN OF PROSTATE CA.Marland Kitchen Rpt bx 12/2022->results pending as of 12/22/22   PTCA     Cath 2011---4 stents--LAD and diagonal.  2014 repeat cath for CP showed no changes.   ROBOT ASSISTED LAPAROSCOPIC RADICAL PROSTATECTOMY  02/27/2023   [name], [name], MD   Scrotal u/s  01/2015   numerous extratesticular cystic lesions bilat, c/w numerous epididymal cysts vs loculated hydrocele--following expectantly.    Outpatient Medications Prior to Visit  Medication Sig Dispense Refill   aspirin EC 81 MG tablet Take 81 mg by mouth daily.     atorvastatin (LIPITOR) 40 MG tablet Take 1 tablet (40 mg total) by mouth daily. 90 tablet 3   carvedilol (COREG) 6.25 MG tablet Take 1 tablet (6.25 mg  total) by mouth 2 (two) times daily. 180 tablet 3   lisinopril (ZESTRIL) 5 MG tablet Take 1 tablet (5 mg total) by mouth daily. 90 tablet 3   melatonin 5 MG TABS Take 5 mg by mouth at bedtime.     Multiple Vitamins-Minerals (MULTIVITAMIN ADULT PO) Take 1 capsule by mouth daily.     omeprazole (PRILOSEC) 20 MG capsule Take 20 mg by mouth daily.     polyethylene glycol (MIRALAX / GLYCOLAX) 17 g packet Take 17 g by mouth daily.     tadalafil (CIALIS) 5 MG tablet Take one 5 mg tablet once daily at approximately the same time daily without regard to timing of sexual activity.     ticagrelor (BRILINTA) 60 MG TABS tablet Take 1 tablet (60 mg total) by mouth 2 (two) times daily. 180 tablet 3   nitroGLYCERIN (NITROLINGUAL) 0.4 MG/SPRAY  spray Place 1 spray under the tongue every 5 (five) minutes x 3 doses as needed for chest pain. (Patient not taking: Reported on 08/10/2023) 12 g 3   No facility-administered medications prior to visit.    Allergies  Allergen Reactions   Cephalexin Other (See Comments)    Cause the patient to develop Cdiff     Review of Systems  As per HPI  PE:    08/10/2023    1:04 PM 07/12/2023    9:03 AM 06/26/2023    9:24 AM  Vitals with BMI  Height 5\' 9"     Weight 168 lbs 13 oz  170 lbs 13 oz  BMI 24.92    Systolic 111 115 97  Diastolic 70 65 63  Pulse 48 58 55     Physical Exam  Gen: Alert, well appearing.  Patient is oriented to person, place, time, and situation. Abdomen: Soft, nontender, nondistended. He has a mild diastases rectus defect but additionally there is a palpable abdominal wall defect/hernia starting about 2 to 3 cm above the umbilicus and extending into the umbilicus. No palpable herniated content.   LABS:  Last CBC Lab Results  Component Value Date   WBC 7.2 12/22/2022   HGB 14.3 12/22/2022   HCT 43.3 12/22/2022   MCV 92.7 12/22/2022   MCH 31.0 12/21/2021   RDW 13.4 12/22/2022   PLT 199.0 12/22/2022   Last metabolic panel Lab Results  Component Value Date   GLUCOSE 114 (H) 06/26/2023   NA 140 06/26/2023   K 4.8 06/26/2023   CL 104 06/26/2023   CO2 30 06/26/2023   BUN 16 06/26/2023   CREATININE 1.02 06/26/2023   GFR 76.68 06/26/2023   CALCIUM 9.5 06/26/2023   PROT 6.8 06/26/2023   ALBUMIN 4.4 06/26/2023   BILITOT 0.7 06/26/2023   ALKPHOS 61 06/26/2023   AST 22 06/26/2023   ALT 21 06/26/2023   Lab Results  Component Value Date   HGBA1C 5.9 06/26/2023   IMPRESSION AND PLAN:  #1 periumbilical/umbilical hernia, asymptomatic. Reassured. He expressed understanding but expresses desire to have a second opinion with a Careers adviser. Referral to [facility] surgery ordered today. Signs/symptoms to call or return for were reviewed and pt expressed  understanding.  An After Visit Summary was printed and given to the patient.  FOLLOW UP: Return for as needed.  Signed:  Santiago Bumpers, MD           08/10/2023

## 2023-08-16 ENCOUNTER — Telehealth: Payer: Self-pay

## 2023-08-16 ENCOUNTER — Other Ambulatory Visit: Payer: Medicare Other

## 2023-08-16 DIAGNOSIS — C61 Malignant neoplasm of prostate: Secondary | ICD-10-CM

## 2023-08-16 NOTE — Telephone Encounter (Signed)
  Communication  Reason for CRM: [facility] called stating that patient has been scheduled for        Office consult Tuesday march 18 at 3:20 Dr.Tsuei                FYI.

## 2023-08-17 LAB — PSA: Prostate Specific Ag, Serum: <0.1 ng/mL (ref 0.0–4.0)

## 2023-08-22 ENCOUNTER — Ambulatory Visit: Payer: Self-pay | Admitting: Surgery

## 2023-08-22 NOTE — H&P (Signed)
 Subjective    Chief Complaint: New Consultation (Umbilical hernia w/o obstruction or gangrene)   Urology - Dr. Luane School - [facility] - Dr. Antoine Poche   History of Present Illness: Ricky Chavez is a 68 y.o. male who is seen today as an office consultation at the request of Dr. Milinda Cave for evaluation of New Consultation (Umbilical hernia w/o obstruction or gangrene) .   This is a 68 year old male who is status post robotic radical prostatectomy 02/27/2023 at [facility].  The patient has an incision above his umbilicus.  Over the last couple of weeks, he has noticed a bulge begin to develop in this area.  This causes some mild discomfort.  It remains reducible.  He denies any obstructive symptoms.   The patient is on Brilinta and aspirin for coronary stents.  Despite his comorbidities, the patient is quite active.  He works out regularly including [employer].           Review of Systems: A complete review of systems was obtained from the patient.  I have reviewed this information and discussed as appropriate with the patient.  See HPI as well for other ROS.   Review of Systems  Constitutional: Negative.   HENT:  Positive for hearing loss and tinnitus.   Eyes: Negative.   Respiratory: Negative.    Cardiovascular: Negative.   Gastrointestinal: Negative.   Genitourinary: Negative.   Musculoskeletal: Negative.   Skin: Negative.   Neurological: Negative.   Endo/Heme/Allergies:  Bruises/bleeds easily.  Psychiatric/Behavioral: Negative.          Medical History: Past Medical History      Past Medical History:  Diagnosis Date   History of cancer     Hyperlipidemia     Hypertension          Problem List     Patient Active Problem List  Diagnosis   Coronary artery disease involving coronary bypass graft of native heart without angina pectoris        Past Surgical History       Past Surgical History:  Procedure Laterality Date   cardiac  catheterization   2011    also 2015   HERNIA REPAIR       PROSTATE SURGERY            Allergies       Allergies  Allergen Reactions   Cephalexin Other (See Comments)      Cause the patient to develop Cdiff    Cause the patient to develop Cdiff        Medications Ordered Prior to Encounter        Current Outpatient Medications on File Prior to Visit  Medication Sig Dispense Refill   aspirin 81 MG EC tablet Take 81 mg by mouth once daily       atorvastatin (LIPITOR) 40 MG tablet Take 40 mg by mouth once daily       carvediloL (COREG) 6.25 MG tablet Take 6.25 mg by mouth 2 (two) times daily       lisinopriL (ZESTRIL) 5 MG tablet Take 5 mg by mouth once daily       melatonin-pyridoxine, vit B6, (MELATONIN, WITH B6,) 5-1 mg Tab Take 5 mg by mouth at bedtime       multivitamin with iron (ONE DAILY) 18-0.4 mg Tab tablet         nitroGLYcerin (NITROLINGUAL) 400 mcg/spray spray Place 1 spray under the tongue       omeprazole (PRILOSEC) 20 MG  DR capsule Take 20 mg by mouth once daily       tadalafiL (CIALIS) 5 MG tablet as directed       ticagrelor (BRILINTA) 60 mg tablet Take 60 mg by mouth 2 (two) times daily        No current facility-administered medications on file prior to visit.        Family History       Family History  Problem Relation Age of Onset   Hyperlipidemia (Elevated cholesterol) Mother     Coronary Artery Disease (Blocked arteries around heart) Mother     High blood pressure (Hypertension) Father     Hyperlipidemia (Elevated cholesterol) Father     Coronary Artery Disease (Blocked arteries around heart) Father     Hyperlipidemia (Elevated cholesterol) Sister     High blood pressure (Hypertension) Brother     Hyperlipidemia (Elevated cholesterol) Brother          Tobacco Use History  Social History        Tobacco Use  Smoking Status Former   Types: Cigarettes  Smokeless Tobacco Never        Social History  Social History         Socioeconomic  History   Marital status: Married  Tobacco Use   Smoking status: Former      Types: Cigarettes   Smokeless tobacco: Never  Vaping Use   Vaping status: Never Used  Substance and Sexual Activity   Alcohol use: Yes   Drug use: Never    Social Drivers of Acupuncturist Strain: Low Risk  (06/19/2023)    Received from [facility]    Overall Financial Resource Strain (CARDIA)     Difficulty of Paying Living Expenses: Not hard at all  Food Insecurity: No Food Insecurity (06/19/2023)    Received from [facility]    Hunger Vital Sign     Worried About Running Out of Food in the Last Year: Never true     Ran Out of Food in the Last Year: Never true  Transportation Needs: No Transportation Needs (06/19/2023)    Received from [facility] - Transportation     Lack of Transportation (Medical): No     Lack of Transportation (Non-Medical): No  Physical Activity: Sufficiently Active (06/19/2023)    Received from [facility]    Exercise Vital Sign     Days of Exercise per Week: 3 days     Minutes of Exercise per Session: 60 min  Stress: No Stress Concern Present (06/19/2023)    Received from [facility] - Occupational Stress Questionnaire     Feeling of Stress : Not at all  Social Connections: Socially Integrated (06/19/2023)    Received from [facility]    Social Connection and Isolation Panel [NHANES]     Frequency of Communication with Friends and Family: More than three times a week     Frequency of Social Gatherings with Friends and Family: Three times a week     Attends Religious Services: More than 4 times per year     Active Member of Clubs or Organizations: Yes     Attends Banker Meetings: More than 4 times per year     Marital Status: Married  Housing Stability: Unknown (08/22/2023)    Housing Stability Vital Sign     Homeless in the Last Year: No  Objective:         Vitals:     08/22/23 1515  BP: (!) 147/80  Pulse: 55  Temp: 36.7 C (98 F)  SpO2: 98%  Weight: 77.7 kg (171 lb 3.2 oz)  Height: 172.7 cm (5\' 8" )  PainSc: 0-No pain  PainLoc: Abdomen    Body mass index is 26.03 kg/m.   Physical Exam    Constitutional:  WDWN in NAD, conversant, no obvious deformities; lying in bed comfortably Eyes:  Pupils equal, round; sclera anicteric; moist conjunctiva; no lid lag HENT:  Oral mucosa moist; good dentition  Neck:  No masses palpated, trachea midline; no thyromegaly Lungs:  CTA bilaterally; normal respiratory effort CV:  Regular rate and rhythm; no murmurs; extremities well-perfused with no edema Abd:  +bowel sounds, soft, non-tender, no palpable organomegaly; healed supraumbilical incision.  Just above the incision, there is a palpable hernia that protrudes with Valsalva maneuver.  It is completely reducible.  The fascial defect is approximately 1.5 cm. Musc: Normal gait; no apparent clubbing or cyanosis in extremities Lymphatic:  No palpable cervical or axillary lymphadenopathy Skin:  Warm, dry; no sign of jaundice Psychiatric - alert and oriented x 4; calm mood and affect       Assessment and Plan:  Diagnoses and all orders for this visit:   Ventral incisional hernia       The patient is eager to return to full activity as he enjoys working out regularly.  Recommend open ventral incisional hernia repair with mesh.  The defect is fairly small so we will likely use a small round mesh.The surgical procedure has been discussed with the patient.  Potential risks, benefits, alternative treatments, and expected outcomes have been explained.  All of the patient's questions at this time have been answered.  The likelihood of reaching the patient's treatment goal is good.  The patient understands the proposed surgical procedure and wishes to proceed.       Lissa Morales, MD  08/22/2023 4:54 PM

## 2023-08-22 NOTE — H&P (View-Only) (Signed)
 Subjective    Chief Complaint: New Consultation (Umbilical hernia w/o obstruction or gangrene)   Urology - Dr. Luane School - [facility] - Dr. Antoine Poche   History of Present Illness: Ricky Chavez is a 68 y.o. male who is seen today as an office consultation at the request of Dr. Milinda Cave for evaluation of New Consultation (Umbilical hernia w/o obstruction or gangrene) .   This is a 68 year old male who is status post robotic radical prostatectomy 02/27/2023 at [facility].  The patient has an incision above his umbilicus.  Over the last couple of weeks, he has noticed a bulge begin to develop in this area.  This causes some mild discomfort.  It remains reducible.  He denies any obstructive symptoms.   The patient is on Brilinta and aspirin for coronary stents.  Despite his comorbidities, the patient is quite active.  He works out regularly including [employer].           Review of Systems: A complete review of systems was obtained from the patient.  I have reviewed this information and discussed as appropriate with the patient.  See HPI as well for other ROS.   Review of Systems  Constitutional: Negative.   HENT:  Positive for hearing loss and tinnitus.   Eyes: Negative.   Respiratory: Negative.    Cardiovascular: Negative.   Gastrointestinal: Negative.   Genitourinary: Negative.   Musculoskeletal: Negative.   Skin: Negative.   Neurological: Negative.   Endo/Heme/Allergies:  Bruises/bleeds easily.  Psychiatric/Behavioral: Negative.          Medical History: Past Medical History      Past Medical History:  Diagnosis Date   History of cancer     Hyperlipidemia     Hypertension          Problem List     Patient Active Problem List  Diagnosis   Coronary artery disease involving coronary bypass graft of native heart without angina pectoris        Past Surgical History       Past Surgical History:  Procedure Laterality Date   cardiac  catheterization   2011    also 2015   HERNIA REPAIR       PROSTATE SURGERY            Allergies       Allergies  Allergen Reactions   Cephalexin Other (See Comments)      Cause the patient to develop Cdiff    Cause the patient to develop Cdiff        Medications Ordered Prior to Encounter        Current Outpatient Medications on File Prior to Visit  Medication Sig Dispense Refill   aspirin 81 MG EC tablet Take 81 mg by mouth once daily       atorvastatin (LIPITOR) 40 MG tablet Take 40 mg by mouth once daily       carvediloL (COREG) 6.25 MG tablet Take 6.25 mg by mouth 2 (two) times daily       lisinopriL (ZESTRIL) 5 MG tablet Take 5 mg by mouth once daily       melatonin-pyridoxine, vit B6, (MELATONIN, WITH B6,) 5-1 mg Tab Take 5 mg by mouth at bedtime       multivitamin with iron (ONE DAILY) 18-0.4 mg Tab tablet         nitroGLYcerin (NITROLINGUAL) 400 mcg/spray spray Place 1 spray under the tongue       omeprazole (PRILOSEC) 20 MG  DR capsule Take 20 mg by mouth once daily       tadalafiL (CIALIS) 5 MG tablet as directed       ticagrelor (BRILINTA) 60 mg tablet Take 60 mg by mouth 2 (two) times daily        No current facility-administered medications on file prior to visit.        Family History       Family History  Problem Relation Age of Onset   Hyperlipidemia (Elevated cholesterol) Mother     Coronary Artery Disease (Blocked arteries around heart) Mother     High blood pressure (Hypertension) Father     Hyperlipidemia (Elevated cholesterol) Father     Coronary Artery Disease (Blocked arteries around heart) Father     Hyperlipidemia (Elevated cholesterol) Sister     High blood pressure (Hypertension) Brother     Hyperlipidemia (Elevated cholesterol) Brother          Tobacco Use History  Social History        Tobacco Use  Smoking Status Former   Types: Cigarettes  Smokeless Tobacco Never        Social History  Social History         Socioeconomic  History   Marital status: Married  Tobacco Use   Smoking status: Former      Types: Cigarettes   Smokeless tobacco: Never  Vaping Use   Vaping status: Never Used  Substance and Sexual Activity   Alcohol use: Yes   Drug use: Never    Social Drivers of Acupuncturist Strain: Low Risk  (06/19/2023)    Received from [facility]    Overall Financial Resource Strain (CARDIA)     Difficulty of Paying Living Expenses: Not hard at all  Food Insecurity: No Food Insecurity (06/19/2023)    Received from [facility]    Hunger Vital Sign     Worried About Running Out of Food in the Last Year: Never true     Ran Out of Food in the Last Year: Never true  Transportation Needs: No Transportation Needs (06/19/2023)    Received from [facility] - Transportation     Lack of Transportation (Medical): No     Lack of Transportation (Non-Medical): No  Physical Activity: Sufficiently Active (06/19/2023)    Received from [facility]    Exercise Vital Sign     Days of Exercise per Week: 3 days     Minutes of Exercise per Session: 60 min  Stress: No Stress Concern Present (06/19/2023)    Received from [facility] - Occupational Stress Questionnaire     Feeling of Stress : Not at all  Social Connections: Socially Integrated (06/19/2023)    Received from [facility]    Social Connection and Isolation Panel [NHANES]     Frequency of Communication with Friends and Family: More than three times a week     Frequency of Social Gatherings with Friends and Family: Three times a week     Attends Religious Services: More than 4 times per year     Active Member of Clubs or Organizations: Yes     Attends Banker Meetings: More than 4 times per year     Marital Status: Married  Housing Stability: Unknown (08/22/2023)    Housing Stability Vital Sign     Homeless in the Last Year: No  Objective:         Vitals:     08/22/23 1515  BP: (!) 147/80  Pulse: 55  Temp: 36.7 C (98 F)  SpO2: 98%  Weight: 77.7 kg (171 lb 3.2 oz)  Height: 172.7 cm (5\' 8" )  PainSc: 0-No pain  PainLoc: Abdomen    Body mass index is 26.03 kg/m.   Physical Exam    Constitutional:  WDWN in NAD, conversant, no obvious deformities; lying in bed comfortably Eyes:  Pupils equal, round; sclera anicteric; moist conjunctiva; no lid lag HENT:  Oral mucosa moist; good dentition  Neck:  No masses palpated, trachea midline; no thyromegaly Lungs:  CTA bilaterally; normal respiratory effort CV:  Regular rate and rhythm; no murmurs; extremities well-perfused with no edema Abd:  +bowel sounds, soft, non-tender, no palpable organomegaly; healed supraumbilical incision.  Just above the incision, there is a palpable hernia that protrudes with Valsalva maneuver.  It is completely reducible.  The fascial defect is approximately 1.5 cm. Musc: Normal gait; no apparent clubbing or cyanosis in extremities Lymphatic:  No palpable cervical or axillary lymphadenopathy Skin:  Warm, dry; no sign of jaundice Psychiatric - alert and oriented x 4; calm mood and affect       Assessment and Plan:  Diagnoses and all orders for this visit:   Ventral incisional hernia       The patient is eager to return to full activity as he enjoys working out regularly.  Recommend open ventral incisional hernia repair with mesh.  The defect is fairly small so we will likely use a small round mesh.The surgical procedure has been discussed with the patient.  Potential risks, benefits, alternative treatments, and expected outcomes have been explained.  All of the patient's questions at this time have been answered.  The likelihood of reaching the patient's treatment goal is good.  The patient understands the proposed surgical procedure and wishes to proceed.       Lissa Morales, MD  08/22/2023 4:54 PM

## 2023-08-23 ENCOUNTER — Telehealth: Payer: Self-pay

## 2023-08-23 ENCOUNTER — Ambulatory Visit: Payer: Medicare Other | Admitting: Urology

## 2023-08-23 VITALS — BP 102/61 | HR 50

## 2023-08-23 DIAGNOSIS — C61 Malignant neoplasm of prostate: Secondary | ICD-10-CM

## 2023-08-23 DIAGNOSIS — N5231 Erectile dysfunction following radical prostatectomy: Secondary | ICD-10-CM

## 2023-08-23 LAB — URINALYSIS, ROUTINE W REFLEX MICROSCOPIC
Bilirubin, UA: NEGATIVE
Glucose, UA: NEGATIVE
Ketones, UA: NEGATIVE
Leukocytes,UA: NEGATIVE
Nitrite, UA: NEGATIVE
Protein,UA: NEGATIVE
RBC, UA: NEGATIVE
Specific Gravity, UA: <1.005 — ABNORMAL LOW (ref 1.005–1.030)
Urobilinogen, Ur: 0.2 mg/dL (ref 0.2–1.0)
pH, UA: 6 (ref 5.0–7.5)

## 2023-08-23 NOTE — Telephone Encounter (Signed)
   Name: Ricky Chavez  DOB: May 22, 1956  MRN: [id number]  Primary Cardiologist: Rollene Rotunda, MD   Preoperative team, please contact this patient and set up a phone call appointment for further preoperative risk assessment. Please obtain consent and complete medication review. Thank you for your help.  I confirm that guidance regarding antiplatelet and oral anticoagulation therapy has been completed and, if necessary, noted below.  His Brilinta may be held for 5 days prior to his surgery.  His aspirin will need to be continued throughout the perioperative period.  Please resume Brilinta as soon as hemostasis is achieved at the discretion of the surgeon.  I also confirmed the patient resides in the state of [place]. As per [facility] Medical Board telemedicine laws, the patient must reside in the state in which the provider is licensed.   Ronney Asters, NP 08/23/2023, 8:35 AM  [facility]

## 2023-08-23 NOTE — Progress Notes (Signed)
 08/23/2023 10:42 AM   Ricky Chavez [birth year] [id number]  Referring provider: Jeoffrey Massed, MD [address] [place],  [place] [address]  Followup prostate cancer   HPI: Ricky Chavez is a 48QQ here for followup fro prostate cancer and erectile dysfunction. He is not wearing pads. PSA remains undetectable.  He is using bimix with fair results. His erections are semifirm at 0.39ml bimix.    PMH: Past Medical History:  Diagnosis Date   Allergic rhinitis    BPH with obstruction/lower urinary tract symptoms    flomax and finasteride qod per urol   Coronary artery disease    With preserved EF.  Needs DAPT lifetime due to high risk status.   Elevated PSA onset @ 2009   Neg bx x 3.  MRI c/w bph only.  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   GERD (gastroesophageal reflux disease)    Hearing loss of both ears    History of meniscal tear 2023   L knee   History of myocardial infarction 2011   Hyperlipidemia    very elevated lipoprotein a   Hypertension    Insomnia    Organic impotence    Palpitations 05/2018   PVCs   Prediabetes    fasting gluc 114 and Hba1c 5.9% Jan 2025   Prostate cancer [facility])    radical prostatectomy 03/2023   Sciatica    Piriformis syndrome likely--improving with gabapentin as of 04/2017.  May need MRI L spine and/or emg/NCS   Squamous cell carcinoma     Surgical History: Past Surgical History:  Procedure Laterality Date   AAA screening     normal 04/2023   CARDIAC CATHETERIZATION  01/31/2013   Stents to LAD patent; small diagonal stent with 90% ostial stenosis--good overall LV function with mild anterior and focal inferoapical hypokinesis, EF 60-65%.  Unsuccessful angioplasty of diagonal.   CARDIOVASCULAR STRESS TEST  05/26/2015   stress echo neg for ischemia   CARDIOVASCULAR STRESS TEST  10/04/2017   Myoc perf imaging: normal (EF 59%)   COLONOSCOPY  2008; 06/14/16   2008 Normal.  2018 hyperplastic polyp +internal  hemorrhoids.  Recall 2028.   CYSTOSCOPY WITH INSERTION OF UROLIFT N/A 10/04/2021   Procedure: CYSTOSCOPY WITH INSERTION OF UROLIFT;  Surgeon: Malen Gauze, MD;  Location: [facility];  Service: Urology;  Laterality: N/A;   HYDROCELE EXCISION / REPAIR  early 2000's   INGUINAL HERNIA REPAIR Bilateral 1998   PROSTATE BIOPSY     Multiple; all neg.  Prostate MRI 2018 -NO SIGN OF PROSTATE CA.Marland Kitchen Rpt bx 12/2022->results pending as of 12/22/22   PTCA     Cath 2011---4 stents--LAD and diagonal.  2014 repeat cath for CP showed no changes.   ROBOT ASSISTED LAPAROSCOPIC RADICAL PROSTATECTOMY  02/27/2023   [name], [name], MD   Scrotal u/s  01/2015   numerous extratesticular cystic lesions bilat, c/w numerous epididymal cysts vs loculated hydrocele--following expectantly.    Home Medications:  Allergies as of 08/23/2023       Reactions   Cephalexin Other (See Comments)   Cause the patient to develop Cdiff         Medication List        Accurate as of August 23, 2023 10:42 AM. If you have any questions, ask your nurse or doctor.          aspirin EC 81 MG tablet Take 81 mg by mouth daily.   atorvastatin 40 MG tablet Commonly known  as: LIPITOR Take 1 tablet (40 mg total) by mouth daily.   carvedilol 6.25 MG tablet Commonly known as: Coreg Take 1 tablet (6.25 mg total) by mouth 2 (two) times daily.   lisinopril 5 MG tablet Commonly known as: ZESTRIL Take 1 tablet (5 mg total) by mouth daily.   melatonin 5 MG Tabs Take 5 mg by mouth at bedtime.   MULTIVITAMIN ADULT PO Take 1 capsule by mouth daily.   nitroGLYCERIN 0.4 MG/SPRAY spray Commonly known as: NITROLINGUAL Place 1 spray under the tongue every 5 (five) minutes x 3 doses as needed for chest pain.   omeprazole 20 MG capsule Commonly known as: PRILOSEC Take 20 mg by mouth daily.   polyethylene glycol 17 g packet Commonly known as: MIRALAX / GLYCOLAX Take 17 g by mouth daily.   tadalafil 5 MG tablet Commonly known  as: CIALIS Take one 5 mg tablet once daily at approximately the same time daily without regard to timing of sexual activity.   ticagrelor 60 MG Tabs tablet Commonly known as: Brilinta Take 1 tablet (60 mg total) by mouth 2 (two) times daily.        Allergies:  Allergies  Allergen Reactions   Cephalexin Other (See Comments)    Cause the patient to develop Cdiff     Family History: Family History  Problem Relation Age of Onset   Hyperlipidemia Mother    Hypertension Mother    Heart disease Mother 80       CABG   Other Mother        Neuroendocrine carcinoma pancreas (solid mass) and liver (diffuse)   Heart disease Father 68       CABG   Hyperlipidemia Father    Hypertension Father    Colon polyps Father    Hypothyroidism Sister    Heart attack Brother 21       MI   Prostate cancer Paternal Uncle    Peripheral vascular disease Brother        Carotid    Stroke Neg Hx    Diabetes Neg Hx     Social History:  reports that he quit smoking about 45 years ago. His smoking use included cigarettes. He started smoking about 47 years ago. He has a 0.5 pack-year smoking history. He has never used smokeless tobacco. He reports current alcohol use of about 14.0 standard drinks of alcohol per week. He reports that he does not use drugs.  ROS: All other review of systems were reviewed and are negative except what is noted above in HPI  Physical Exam: BP 102/61   Pulse (!) 50   Constitutional:  Alert and oriented, No acute distress. HEENT: [place] AT, moist mucus membranes.  Trachea midline, no masses. Cardiovascular: No clubbing, cyanosis, or edema. Respiratory: Normal respiratory effort, no increased work of breathing. GI: Abdomen is soft, nontender, nondistended, no abdominal masses GU: No CVA tenderness.  Lymph: No cervical or inguinal lymphadenopathy. Skin: No rashes, bruises or suspicious lesions. Neurologic: Grossly intact, no focal deficits, moving all 4  extremities. Psychiatric: Normal mood and affect.  Laboratory Data: Lab Results  Component Value Date   WBC 7.2 12/22/2022   HGB 14.3 12/22/2022   HCT 43.3 12/22/2022   MCV 92.7 12/22/2022   PLT 199.0 12/22/2022    Lab Results  Component Value Date   CREATININE 1.02 06/26/2023    Lab Results  Component Value Date   PSA 6.62 (H) 12/21/2021   PSA 4.4 (H) 11/25/2019   PSA  5.20 11/20/2019    Lab Results  Component Value Date   TESTOSTERONE 414 11/25/2019    Lab Results  Component Value Date   HGBA1C 5.9 06/26/2023    Urinalysis    Component Value Date/Time   APPEARANCEUR Clear 05/17/2023 0908   GLUCOSEU Negative 05/17/2023 0908   BILIRUBINUR Negative 05/17/2023 0908   PROTEINUR Negative 05/17/2023 0908   NITRITE Negative 05/17/2023 0908   LEUKOCYTESUR Negative 05/17/2023 0908    Lab Results  Component Value Date   LABMICR Comment 05/17/2023   WBCUA >30 (A) 01/17/2022   LABEPIT 0-10 01/17/2022   BACTERIA Many (A) 01/17/2022    Pertinent Imaging:  No results found for this or any previous visit.  No results found for this or any previous visit.  No results found for this or any previous visit.  No results found for this or any previous visit.  No results found for this or any previous visit.  No results found for this or any previous visit.  No results found for this or any previous visit.  No results found for this or any previous visit.   Assessment & Plan:    1. Prostate cancer (HCC) (Primary) Followup 6 months with PSA - Urinalysis, Routine w reflex microscopic  2. Erectile dysfunction -continue bimix. If this fails to give him a firm erection we will proceed with alprostadil therapy.    No follow-ups on file.  Ricky Aye, MD  [facility] [place]

## 2023-08-23 NOTE — Telephone Encounter (Signed)
   Pre-operative Risk Assessment    Patient Name: Ricky Chavez  DOB: [birth year] MRN: [id number]   Date of last office visit: 02/08/23 Rollene Rotunda MD Date of next office visit: NONE   Request for Surgical Clearance    Procedure:   UMBILICAL HERNIA  Date of Surgery:  Clearance TBD                                Surgeon:  Manus Rudd. MD Surgeon's Group or Practice Name:  [facility] Phone number:  [phone] Fax number:  [phone] ATTN: Michel Bickers, LPN   Type of Clearance Requested:   - Pharmacy:  Hold Aspirin and Ticagrelor (Brilinta)     Type of Anesthesia:  General    Additional requests/questions:    Signed, Marlow Baars   08/23/2023, 7:58 AM

## 2023-08-24 ENCOUNTER — Telehealth: Payer: Self-pay | Admitting: *Deleted

## 2023-08-24 NOTE — Telephone Encounter (Signed)
Left message to call back to set up tele pre op appt.  

## 2023-08-24 NOTE — Telephone Encounter (Signed)
 Pt has been scheduled tele preop appt 08/30/23. Med rec and consent are done.      Patient Consent for Virtual Visit        Ricky Chavez has provided verbal consent on 08/24/2023 for a virtual visit (video or telephone).   CONSENT FOR VIRTUAL VISIT FOR:  Ricky Chavez  By participating in this virtual visit I agree to the following:  I hereby voluntarily request, consent and authorize [facility] and its employed or contracted physicians, physician assistants, nurse practitioners or other licensed health care professionals (the Practitioner), to provide me with telemedicine health care services (the "Services") as deemed necessary by the treating Practitioner. I acknowledge and consent to receive the Services by the Practitioner via telemedicine. I understand that the telemedicine visit will involve communicating with the Practitioner through live audiovisual communication technology and the disclosure of certain medical information by electronic transmission. I acknowledge that I have been given the opportunity to request an in-person assessment or other available alternative prior to the telemedicine visit and am voluntarily participating in the telemedicine visit.  I understand that I have the right to withhold or withdraw my consent to the use of telemedicine in the course of my care at any time, without affecting my right to future care or treatment, and that the Practitioner or I may terminate the telemedicine visit at any time. I understand that I have the right to inspect all information obtained and/or recorded in the course of the telemedicine visit and may receive copies of available information for a reasonable fee.  I understand that some of the potential risks of receiving the Services via telemedicine include:  Delay or interruption in medical evaluation due to technological equipment failure or disruption; Information transmitted may not be sufficient (e.g. poor  resolution of images) to allow for appropriate medical decision making by the Practitioner; and/or  In rare instances, security protocols could fail, causing a breach of personal health information.  Furthermore, I acknowledge that it is my responsibility to provide information about my medical history, conditions and care that is complete and accurate to the best of my ability. I acknowledge that Practitioner's advice, recommendations, and/or decision may be based on factors not within their control, such as incomplete or inaccurate data provided by me or distortions of diagnostic images or specimens that may result from electronic transmissions. I understand that the practice of medicine is not an exact science and that Practitioner makes no warranties or guarantees regarding treatment outcomes. I acknowledge that a copy of this consent can be made available to me via my patient portal [facility] MyChart), or I can request a printed copy by calling the office of [facility].    I understand that my insurance will be billed for this visit.   I have read or had this consent read to me. I understand the contents of this consent, which adequately explains the benefits and risks of the Services being provided via telemedicine.  I have been provided ample opportunity to ask questions regarding this consent and the Services and have had my questions answered to my satisfaction. I give my informed consent for the services to be provided through the use of telemedicine in my medical care

## 2023-08-24 NOTE — Telephone Encounter (Signed)
 Pt has been scheduled tele preop appt 08/30/23. Med rec and consent are done.

## 2023-08-24 NOTE — Telephone Encounter (Signed)
 Patient is returning phone call.

## 2023-08-29 ENCOUNTER — Encounter: Payer: Self-pay | Admitting: Urology

## 2023-08-29 NOTE — Patient Instructions (Signed)

## 2023-08-31 ENCOUNTER — Ambulatory Visit: Attending: Physician Assistant

## 2023-08-31 DIAGNOSIS — Z01810 Encounter for preprocedural cardiovascular examination: Secondary | ICD-10-CM | POA: Diagnosis not present

## 2023-08-31 NOTE — Progress Notes (Signed)
 Virtual Visit via Telephone Note   Because of Ricky Chavez co-morbid illnesses, he is at least at moderate risk for complications without adequate follow up.  This format is felt to be most appropriate for this patient at this time.  Due to technical limitations with video connection (technology), today's appointment will be conducted as an audio only telehealth visit, and Ricky Chavez verbally agreed to proceed in this manner.   All issues noted in this document were discussed and addressed.  No physical exam could be performed with this format.  Evaluation Performed:  Preoperative cardiovascular risk assessment _____________   Date:  08/31/2023   [name], DOB [birth year], MRN [id number] Patient Location:  Home Provider location:   Office  Primary Care Provider:  Jeoffrey Massed, MD Primary Cardiologist:  Rollene Rotunda, MD  Chief Complaint / Patient Profile   68 y.o. y/o male with a h/o coronary artery disease with stents to LAD back in 2016, unsuccessful angioplasty of the diagonal, hypertension, palpitations who is pending umbilical hernia repair and presents today for telephonic preoperative cardiovascular risk assessment.  History of Present Illness    Ricky Chavez is a 68 y.o. male who presents via audio/video conferencing for a telehealth visit today.  Pt was last seen in cardiology clinic on 02/08/2023 by Dr. Antoine Poche.  At that time Ricky Chavez was doing well .  The patient is now pending procedure as outlined above. Since his last visit, he has been doing well from a cardiac standpoint.  No chest pain or shortness of breath.  [place]  Was going to the gym 3 to 4 days a week before the hernia.  He does meet greater than 4 METS on the DASI.  His Brilinta may be held for 5 days prior to his surgery. His aspirin will need to be continued throughout the perioperative period. If bleeding risk is too high, you can hold  ASA x 5-7 days. Please resume Brilinta as soon as hemostasis is achieved at the discretion of the surgeon.   Reports no shortness of breath nor dyspnea on exertion. Reports no chest pain, pressure, or tightness. No edema, orthopnea, PND. Reports no palpitations.    Past Medical History    Past Medical History:  Diagnosis Date   Allergic rhinitis    BPH with obstruction/lower urinary tract symptoms    flomax and finasteride qod per urol   Coronary artery disease    With preserved EF.  Needs DAPT lifetime due to high risk status.   Elevated PSA onset @ 2009   Neg bx x 3.  MRI c/w bph only.  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  May need MRI L spine and/or emg/NCS   Squamous cell carcinoma    Past Surgical History:  Procedure Laterality Date   AAA screening  normal 04/2023   CARDIAC CATHETERIZATION  01/31/2013   Stents to LAD patent; small diagonal stent with 90% ostial stenosis--good overall LV function with mild anterior and focal inferoapical hypokinesis, EF 60-65%.  Unsuccessful angioplasty of diagonal.   CARDIOVASCULAR STRESS TEST  05/26/2015   stress echo neg for ischemia   CARDIOVASCULAR STRESS TEST  10/04/2017   Myoc perf imaging: normal (EF 59%)   COLONOSCOPY  2008; 06/14/16   2008 Normal.  2018 hyperplastic polyp +internal hemorrhoids.  Recall 2028.   CYSTOSCOPY WITH INSERTION OF UROLIFT N/A 10/04/2021   Procedure: CYSTOSCOPY WITH INSERTION OF UROLIFT;  Surgeon: Malen Gauze, MD;  Location: [facility];  Service: Urology;  Laterality: N/A;   HYDROCELE EXCISION / REPAIR  early 2000's   INGUINAL HERNIA REPAIR Bilateral 1998   PROSTATE BIOPSY     Multiple; all neg.  Prostate MRI 2018 -NO SIGN OF PROSTATE CA.Marland Kitchen Rpt bx 12/2022->results pending as of 12/22/22   PTCA     Cath 2011---4 stents--LAD and diagonal.  2014 repeat cath for CP showed no changes.   ROBOT ASSISTED LAPAROSCOPIC RADICAL PROSTATECTOMY  02/27/2023   [name], [name], MD   Scrotal u/s  01/2015   numerous extratesticular cystic lesions bilat, c/w numerous epididymal cysts vs loculated hydrocele--following expectantly.    Allergies  Allergies  Allergen Reactions   Cephalexin Other (See Comments)    Cause the patient to develop Cdiff     Home Medications    Prior to Admission medications   Medication Sig Start Date End Date Taking? Authorizing Provider  aspirin EC 81 MG tablet Take 81 mg by mouth daily.    [provider]  atorvastatin (LIPITOR) 40 MG tablet Take 1 tablet (40 mg total) by mouth daily. 11/23/22   Carlos Levering, NP  carvedilol (COREG) 6.25 MG tablet Take 1 tablet (6.25 mg total) by mouth 2 (two) times daily. 11/23/22   Carlos Levering, NP  lisinopril (ZESTRIL) 5 MG tablet Take 1 tablet (5 mg total) by mouth daily. 11/23/22   Carlos Levering, NP  melatonin 5 MG TABS Take 5 mg by mouth at bedtime.    [provider]  Multiple Vitamins-Minerals (MULTIVITAMIN ADULT PO) Take 1 capsule by mouth daily.    [provider]  nitroGLYCERIN (NITROLINGUAL) 0.4 MG/SPRAY spray Place 1 spray under the tongue every 5 (five) minutes x 3 doses as needed for chest pain. Patient not taking: Reported on 08/10/2023 11/23/22   Carlos Levering, NP  omeprazole (PRILOSEC) 20 MG capsule Take 20 mg by mouth daily.    [provider]  polyethylene glycol (MIRALAX / GLYCOLAX) 17 g packet Take 17 g by mouth daily.    [provider]  tadalafil  (CIALIS) 5 MG tablet Take one 5 mg tablet once daily at approximately the same time daily without regard to timing of sexual activity. 04/26/23   [provider]  ticagrelor (BRILINTA) 60 MG TABS tablet Take 1 tablet (60 mg total) by mouth 2 (two) times daily. 11/23/22   Carlos Levering, NP    Physical Exam    Vital Signs:  Ricky Chavez does not have vital signs available for review today.  Given telephonic nature of communication, physical exam is limited. AAOx3. NAD. Normal affect.  Speech and respirations are unlabored.  Accessory Clinical Findings    None  Assessment & Plan    1.  Preoperative Cardiovascular Risk Assessment:   Ricky Chavez perioperative risk of a major cardiac event is 6.6% according to  the Revised Cardiac Risk Index (RCRI).  Therefore, he is at high risk for perioperative complications.   His functional capacity is good at 6.61 METs according to the [employer] Activity Status Index (DASI). Recommendations: According to ACC/AHA guidelines, no further cardiovascular testing needed.  The patient may proceed to surgery at acceptable risk.   Antiplatelet and/or Anticoagulation Recommendations: Ticagrelor (Brilinta) can be held for 5 days prior to his surgery and resumed as soon as possible post op.  Please continue ASA throughout.   The patient was advised that if he develops new symptoms prior to surgery to contact our office to arrange for a follow-up visit, and he verbalized understanding.   A copy of this note will be routed to requesting surgeon.  Time:   Today, I have spent 5 minutes with the patient with telehealth technology discussing medical history, symptoms, and management plan.     Sharlene Dory, PA-C  08/31/2023, 9:01 AM

## 2023-09-08 NOTE — Progress Notes (Signed)
 Surgical Instructions   Your procedure is scheduled on September 14, 2022. Report to [facility] Main Entrance "A" at 11:30 A.M., then check in with the Admitting office. Any questions or running late day of surgery: call [phone]  Questions prior to your surgery date: call [phone], Monday-Friday, 8am-4pm. If you experience any cold or flu symptoms such as cough, fever, chills, shortness of breath, etc. between now and your scheduled surgery, please notify us at the above number.     Remember:  Do not eat after midnight the night before your surgery   You may drink clear liquids until 10:30 the morning of your surgery.   Clear liquids allowed are: Water, Non-Citrus Juices (without pulp), Carbonated Beverages, Clear Tea (no milk, honey, etc.), Black Coffee Only (NO MILK, CREAM OR POWDERED CREAMER of any kind), and Gatorade.    Take these medicines the morning of surgery with A SIP OF WATER  aspirin  atorvastatin (LIPITOR)  carvedilol (COREG)  omeprazole (PRILOSEC)   May take these medicines IF NEEDED: acetaminophen (TYLENOL)  nitroGLYCERIN (NITROLINGUAL) Please call [phone] if used prior to surgery  One week prior to surgery, STOP taking any Aspirin (unless otherwise instructed by your surgeon) Aleve, Naproxen, Ibuprofen, Motrin, Advil, Goody's, BC's, all herbal medications, fish oil, and non-prescription vitamins.                     Do NOT Smoke (Tobacco/Vaping) for 24 hours prior to your procedure.  If you use a CPAP at night, you may bring your mask/headgear for your overnight stay.   You will be asked to remove any contacts, glasses, piercing's, hearing aid's, dentures/partials prior to surgery. Please bring cases for these items if needed.    Patients discharged the day of surgery will not be allowed to drive home, and someone needs to stay with them for 24 hours.  SURGICAL WAITING ROOM VISITATION Patients may have no more than 2 support people in the waiting  area - these visitors may rotate.   Pre-op nurse will coordinate an appropriate time for 1 ADULT support person, who may not rotate, to accompany patient in pre-op.  Children under the age of 23 must have an adult with them who is not the patient and must remain in the main waiting area with an adult.  If the patient needs to stay at the hospital during part of their recovery, the visitor guidelines for inpatient rooms apply.  Please refer to the [facility] website for the visitor guidelines for any additional information.   If you received a COVID test during your pre-op visit  it is requested that you wear a mask when out in public, stay away from anyone that may not be feeling well and notify your surgeon if you develop symptoms. If you have been in contact with anyone that has tested positive in the last 10 days please notify you surgeon.      Pre-operative CHG Bathing Instructions   You can play a key role in reducing the risk of infection after surgery. Your skin needs to be as free of germs as possible. You can reduce the number of germs on your skin by washing with CHG (chlorhexidine gluconate) soap before surgery. CHG is an antiseptic soap that kills germs and continues to kill germs even after washing.   DO NOT use if you have an allergy to chlorhexidine/CHG or antibacterial soaps. If your skin becomes reddened or irritated, stop using the CHG and notify one of  our RNs at [phone].              TAKE A SHOWER THE NIGHT BEFORE SURGERY AND THE DAY OF SURGERY    Please keep in mind the following:  DO NOT shave, including legs and underarms, 48 hours prior to surgery.   You may shave your face before/day of surgery.  Place clean sheets on your bed the night before surgery Use a clean washcloth (not used since being washed) for each shower. DO NOT sleep with pet's night before surgery.  CHG Shower Instructions:  Wash your face and private area with normal soap. If you choose to  wash your hair, wash first with your normal shampoo.  After you use shampoo/soap, rinse your hair and body thoroughly to remove shampoo/soap residue.  Turn the water OFF and apply half the bottle of CHG soap to a CLEAN washcloth.  Apply CHG soap ONLY FROM YOUR NECK DOWN TO YOUR TOES (washing for 3-5 minutes)  DO NOT use CHG soap on face, private areas, open wounds, or sores.  Pay special attention to the area where your surgery is being performed.  If you are having back surgery, having someone wash your back for you may be helpful. Wait 2 minutes after CHG soap is applied, then you may rinse off the CHG soap.  Pat dry with a clean towel  Put on clean pajamas    Additional instructions for the day of surgery: DO NOT APPLY any lotions, deodorants, cologne, or perfumes.   Do not wear jewelry or makeup Do not wear nail polish, gel polish, artificial nails, or any other type of covering on natural nails (fingers and toes) Do not bring valuables to the hospital. [facility] is not responsible for valuables/personal belongings. Put on clean/comfortable clothes.  Please brush your teeth.  Ask your nurse before applying any prescription medications to the skin.

## 2023-09-11 ENCOUNTER — Other Ambulatory Visit: Payer: Self-pay

## 2023-09-11 ENCOUNTER — Encounter (HOSPITAL_COMMUNITY)
Admission: RE | Admit: 2023-09-11 | Discharge: 2023-09-11 | Disposition: A | Discharge status: Home / Self Care | Source: Ambulatory Visit | Attending: Surgery | Admitting: Surgery

## 2023-09-11 ENCOUNTER — Encounter (HOSPITAL_COMMUNITY): Payer: Self-pay

## 2023-09-11 VITALS — BP 99/66 | HR 56 | Temp 97.9°F | Resp 18 | Ht 69.0 in | Wt 168.9 lb

## 2023-09-11 DIAGNOSIS — Z01812 Encounter for preprocedural laboratory examination: Secondary | ICD-10-CM | POA: Diagnosis present

## 2023-09-11 DIAGNOSIS — K432 Incisional hernia without obstruction or gangrene: Secondary | ICD-10-CM | POA: Insufficient documentation

## 2023-09-11 DIAGNOSIS — Z87891 Personal history of nicotine dependence: Secondary | ICD-10-CM | POA: Insufficient documentation

## 2023-09-11 DIAGNOSIS — E785 Hyperlipidemia, unspecified: Secondary | ICD-10-CM | POA: Insufficient documentation

## 2023-09-11 DIAGNOSIS — R7303 Prediabetes: Secondary | ICD-10-CM | POA: Diagnosis not present

## 2023-09-11 DIAGNOSIS — I2510 Atherosclerotic heart disease of native coronary artery without angina pectoris: Secondary | ICD-10-CM | POA: Insufficient documentation

## 2023-09-11 DIAGNOSIS — I10 Essential (primary) hypertension: Secondary | ICD-10-CM | POA: Insufficient documentation

## 2023-09-11 DIAGNOSIS — I252 Old myocardial infarction: Secondary | ICD-10-CM | POA: Diagnosis not present

## 2023-09-11 DIAGNOSIS — Z01818 Encounter for other preprocedural examination: Secondary | ICD-10-CM

## 2023-09-11 HISTORY — DX: Prediabetes: R73.03

## 2023-09-11 HISTORY — DX: Unspecified osteoarthritis, unspecified site: M19.90

## 2023-09-11 LAB — BASIC METABOLIC PANEL WITH GFR
Anion gap: 8 (ref 5–15)
BUN: 15 mg/dL (ref 8–23)
CO2: 25 mmol/L (ref 22–32)
Calcium: 9.3 mg/dL (ref 8.9–10.3)
Chloride: 106 mmol/L (ref 98–111)
Creatinine, Ser: 1.02 mg/dL (ref 0.61–1.24)
GFR, Estimated: >60 mL/min (ref >60)
Glucose, Bld: 100 mg/dL — ABNORMAL HIGH (ref 70–99)
Potassium: 4.2 mmol/L (ref 3.5–5.1)
Sodium: 139 mmol/L (ref 135–145)

## 2023-09-11 LAB — CBC
HCT: 44.3 % (ref 39.0–52.0)
Hemoglobin: 15.1 g/dL (ref 13.0–17.0)
MCH: 31.1 pg (ref 26.0–34.0)
MCHC: 34.1 g/dL (ref 30.0–36.0)
MCV: 91.2 fL (ref 80.0–100.0)
Platelets: 163 10*3/uL (ref 150–400)
RBC: 4.86 MIL/uL (ref 4.22–5.81)
RDW: 13 % (ref 11.5–15.5)
WBC: 7.4 10*3/uL (ref 4.0–10.5)
nRBC: 0 % (ref 0.0–0.2)

## 2023-09-11 NOTE — Progress Notes (Signed)
 Surgical Instructions   Your procedure is scheduled on September 14, 2022. Report to [facility] Main Entrance "A" at 11:30 A.M., then check in with the Admitting office. Any questions or running late day of surgery: call [phone]  Questions prior to your surgery date: call [phone], Monday-Friday, 8am-4pm. If you experience any cold or flu symptoms such as cough, fever, chills, shortness of breath, etc. between now and your scheduled surgery, please notify us at the above number.     Remember:  Do not eat after midnight the night before your surgery   You may drink clear liquids until 10:30 the morning of your surgery.   Clear liquids allowed are: Water, Non-Citrus Juices (without pulp), Carbonated Beverages, Clear Tea (no milk, honey, etc.), Black Coffee Only (NO MILK, CREAM OR POWDERED CREAMER of any kind), and Gatorade.    Take these medicines the morning of surgery with A SIP OF WATER  aspirin  atorvastatin (LIPITOR)  carvedilol (COREG)  omeprazole (PRILOSEC)   May take these medicines IF NEEDED: acetaminophen (TYLENOL)  nitroGLYCERIN (NITROLINGUAL) Please call [phone] if used prior to surgery  One week prior to surgery, STOP taking any Aleve, Naproxen, Ibuprofen, Motrin, Advil, Goody's, BC's, all herbal medications, fish oil, and non-prescription vitamins.  According to your cardiologist's instructions, your Brilinta can be held 5 days prior to surgery. Your last dose is 09/09/23. Aspirin should be continued per the cardiologist.                     Do NOT Smoke (Tobacco/Vaping) for 24 hours prior to your procedure.  If you use a CPAP at night, you may bring your mask/headgear for your overnight stay.   You will be asked to remove any contacts, glasses, piercing's, hearing aid's, dentures/partials prior to surgery. Please bring cases for these items if needed.    Patients discharged the day of surgery will not be allowed to drive home, and someone needs to stay with  them for 24 hours.  SURGICAL WAITING ROOM VISITATION Patients may have no more than 2 support people in the waiting area - these visitors may rotate.   Pre-op nurse will coordinate an appropriate time for 1 ADULT support person, who may not rotate, to accompany patient in pre-op.  Children under the age of 30 must have an adult with them who is not the patient and must remain in the main waiting area with an adult.  If the patient needs to stay at the hospital during part of their recovery, the visitor guidelines for inpatient rooms apply.  Please refer to the [facility] website for the visitor guidelines for any additional information.   If you received a COVID test during your pre-op visit  it is requested that you wear a mask when out in public, stay away from anyone that may not be feeling well and notify your surgeon if you develop symptoms. If you have been in contact with anyone that has tested positive in the last 10 days please notify you surgeon.      Pre-operative CHG Bathing Instructions   You can play a key role in reducing the risk of infection after surgery. Your skin needs to be as free of germs as possible. You can reduce the number of germs on your skin by washing with CHG (chlorhexidine gluconate) soap before surgery. CHG is an antiseptic soap that kills germs and continues to kill germs even after washing.   DO NOT use if you have an  allergy to chlorhexidine/CHG or antibacterial soaps. If your skin becomes reddened or irritated, stop using the CHG and notify one of our RNs at [phone].              TAKE A SHOWER THE NIGHT BEFORE SURGERY AND THE DAY OF SURGERY    Please keep in mind the following:  DO NOT shave, including legs and underarms, 48 hours prior to surgery.   You may shave your face before/day of surgery.  Place clean sheets on your bed the night before surgery Use a clean washcloth (not used since being washed) for each shower. DO NOT sleep with pet's  night before surgery.  CHG Shower Instructions:  Wash your face and private area with normal soap. If you choose to wash your hair, wash first with your normal shampoo.  After you use shampoo/soap, rinse your hair and body thoroughly to remove shampoo/soap residue.  Turn the water OFF and apply half the bottle of CHG soap to a CLEAN washcloth.  Apply CHG soap ONLY FROM YOUR NECK DOWN TO YOUR TOES (washing for 3-5 minutes)  DO NOT use CHG soap on face, private areas, open wounds, or sores.  Pay special attention to the area where your surgery is being performed.  If you are having back surgery, having someone wash your back for you may be helpful. Wait 2 minutes after CHG soap is applied, then you may rinse off the CHG soap.  Pat dry with a clean towel  Put on clean pajamas    Additional instructions for the day of surgery: DO NOT APPLY any lotions, deodorants, cologne, or perfumes.   Do not wear jewelry or makeup Do not wear nail polish, gel polish, artificial nails, or any other type of covering on natural nails (fingers and toes) Do not bring valuables to the hospital. [facility] is not responsible for valuables/personal belongings. Put on clean/comfortable clothes.  Please brush your teeth.  Ask your nurse before applying any prescription medications to the skin.

## 2023-09-11 NOTE — Progress Notes (Addendum)
 PCP - Myles Rosenthal Cardiologist - Melany Guernsey  PPM/ICD - denies Device Orders -  Rep Notified -   Chest x-ray - na EKG - 02/08/23 Stress Test - 10/04/17 ECHO - 04/05/16 Cardiac Cath - 12/26/09  Sleep Study - denies CPAP -   Fasting Blood Sugar - na Checks Blood Sugar _____ times a day  Last dose of GLP1 agonist-  na GLP1 instructions:   Blood Thinner Instructions:per cardiology, hold Brilinta 5 days prior to surgery. Pt reports he last took Brilinta 09/08/23. Aspirin Instructions: per cardiology, continue Aspirin.  ERAS Protcol -cleas until 1030. PRE-SURGERY Ensure or G2- no  COVID TEST- na   Anesthesia review: yes  Patient denies shortness of breath, fever, cough and chest pain at PAT appointment   All instructions explained to the patient, with a verbal understanding of the material. Patient agrees to go over the instructions while at home for a better understanding. Patient also instructed to wear a mask when out in public prior to surgery. The opportunity to ask questions was provided.

## 2023-09-12 NOTE — Progress Notes (Signed)
 Anesthesia Chart Review:  Case: [id number] Date/Time: 09/14/23 1115   Procedure: REPAIR, HERNIA, VENTRAL - OPEN VENTRAL INCISIONAL HERNIA REAPIR WITH MESH   Anesthesia type: General   Diagnosis: Ventral incisional hernia [K43.2]   Pre-op diagnosis: VENTRAL INCISIONAL HERNIA   [facility] ROOM 09 / [facility] OR   Surgeons: Manus Rudd, MD       DISCUSSION: Patient is a 68 year old male scheduled for the above procedure.  History includes former smoker (quit 06/06/78), HTN, HLD, CAD (MI 2011, s/p LAD stents; unsuccessful PCI DIAG 2014), palpitations (PVCs), prediabetes, GERD, Prostate cancer (s/p robotic assisted laparoscopic radical prostatectomy 02/27/23), skin cancer (SCC).  He had preoperative telephonic cardiology evaluation on 08/31/23 by Jari Favre, PA-C.  She noted she was doing well from a cardiac standpoint.  [place] and was going to the gym 3 to 4 days a week prior to his hernia. She wrote, "Mr. Schaible's perioperative risk of a major cardiac event is 6.6% according to the Revised Cardiac Risk Index (RCRI).  Therefore, he is at high risk for perioperative complications.   His functional capacity is good at 6.61 METs according to the Duke Activity Status Index (DASI). Recommendations: According to ACC/AHA guidelines, no further cardiovascular testing needed.  The patient may proceed to surgery at acceptable risk.   Antiplatelet and/or Anticoagulation Recommendations: Ticagrelor (Brilinta) can be held for 5 days prior to his surgery and resumed as soon as possible post op.  Please continue ASA throughout."    He reported instructions last Brilinta 09/08/2023.    Anesthesia team to evaluate on the day of surgery.   VS: BP 99/66   Pulse (!) 56   Temp 36.6 C   Resp 18   Ht 5\' 9"  (1.753 m)   Wt 76.6 kg   SpO2 98%   BMI 24.94 kg/m   PROVIDERS: McGowen, Maryjean Morn, MD is PCP Rollene Rotunda, MD cardiologist   LABS: Labs reviewed: Acceptable for surgery. (all  labs ordered are listed, but only abnormal results are displayed)  Labs Reviewed  BASIC METABOLIC PANEL WITH GFR - Abnormal; Notable for the following components:      Result Value   Glucose, Bld 100 (*)    All other components within normal limits  CBC     IMAGES: PET Scan 01/27/23: IMPRESSION: 1. Focal radiotracer activity in the peripheral zone of the prostate gland consistent with primary prostate adenocarcinoma. 2. No evidence of metastatic adenopathy in the pelvis or periaortic retroperitoneum. 3. No evidence of visceral metastasis or skeletal metastasis.     EKG: 02/08/23: Sinus bradycardia at 53 bpm Cannot rule out Anterior infarct , age undetermined When compared with ECG of 23-Nov-2022 11:30, No significant change was found Confirmed by Rollene Rotunda ([id number]) on 02/08/2023 9:20:49 AM   CV: US Aorta/Iliac 04/28/23: Summary:  Abdominal Aorta: The largest aortic measurement is 2.5 cm. Right common  iliac is dilated in the proximal segment at 1.3 cm but is still within the  upper limits of normal.    Nuclear stress test 10/04/17: The left ventricular ejection fraction is normal (55-65%). Nuclear stress EF: 59%. There was no ST segment deviation noted during stress. The study is normal. This is a low risk study.   Stress Echo 05/22/15 ([facility]): Summary:  1. The left ventricular chamber size is normal.  2. Global left ventricular wall motion and contractility are within  normal limits.  3. The EF is estimated at 60-65%.  4. There is mild  sclerosis of the aortic valve cusps.  - This was a negative echocardiographic  stress test.    Cardiac cath 01/31/13 ([facility]): FINDINGS:  1. Multiple stents in the proximal mid and distal LAD are patent.  2. The small stent in the diagonal has a 90% ostial stenosis.  This has the     appearance of a "crushed" bifurcation stenting technique.  3. Left circumflex is large and normal.  4. Right coronary is codominant  and normal.  5. Good overall LV function with mild anterior hypokinesis and focal     inferoapical hypokinesis.  Ejection fraction 60% to 65%.  6. Normal LVEDP of 9 mmHg.  7. Abnormal FFR of the diagonal at 0.76.  - Attempted angioplasty of the diagonal.  This was unsuccessful.  RECOMMENDATIONS:  1. Will continue medical therapy.  2. Will follow up with stress echocardiogram since the patient's pain is     atypical.  This would give a better idea of the functional capacity and     the magnitude of ischemia in the distribution of the diagonal.  It is     not a very large vessel.  It would be a shame to close the LAD while     trying to perform PCI to the ostium of the diagonal.   Past Medical History:  Diagnosis Date   Allergic rhinitis    Arthritis    BPH with obstruction/lower urinary tract symptoms    flomax and finasteride qod per urol   Coronary artery disease    With preserved EF.  Needs DAPT lifetime due to high risk status.   Elevated PSA onset @ 2009   Neg bx x 3.  MRI c/w bph only.  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   GERD (gastroesophageal reflux disease)    Hearing loss of both ears    History of meniscal tear 2023   L knee   History of myocardial infarction 2011   Hyperlipidemia    very elevated lipoprotein a   Hypertension    Insomnia    Myocardial infarction (HCC) 2011   Organic impotence    Palpitations 05/2018   PVCs   Pre-diabetes    Prediabetes    fasting gluc 114 and Hba1c 5.9% Jan 2025   Prostate cancer [facility])    radical prostatectomy 03/2023   Sciatica    Piriformis syndrome likely--improving with gabapentin as of 04/2017.  May need MRI L spine and/or emg/NCS   Squamous cell carcinoma     Past Surgical History:  Procedure Laterality Date   AAA screening     normal 04/2023   CARDIAC CATHETERIZATION  01/31/2013   Stents to LAD patent; small diagonal stent with 90% ostial stenosis--good overall LV function with mild anterior  and focal inferoapical hypokinesis, EF 60-65%.  Unsuccessful angioplasty of diagonal.   CARDIOVASCULAR STRESS TEST  05/26/2015   stress echo neg for ischemia   CARDIOVASCULAR STRESS TEST  10/04/2017   Myoc perf imaging: normal (EF 59%)   COLONOSCOPY  2008; 06/14/16   2008 Normal.  2018 hyperplastic polyp +internal hemorrhoids.  Recall 2028.   CYSTOSCOPY WITH INSERTION OF UROLIFT N/A 10/04/2021   Procedure: CYSTOSCOPY WITH INSERTION OF UROLIFT;  Surgeon: Malen Gauze, MD;  Location: [facility];  Service: Urology;  Laterality: N/A;   HYDROCELE EXCISION / REPAIR  early 2000's   INGUINAL HERNIA REPAIR Bilateral 1998   PROSTATE BIOPSY     Multiple; all neg.  Prostate MRI 2018 -NO SIGN  OF PROSTATE CA.Marland Kitchen Rpt bx 12/2022->results pending as of 12/22/22   PTCA     Cath 2011---4 stents--LAD and diagonal.  2014 repeat cath for CP showed no changes.   ROBOT ASSISTED LAPAROSCOPIC RADICAL PROSTATECTOMY  02/27/2023   [name], [name], MD   Scrotal u/s  01/2015   numerous extratesticular cystic lesions bilat, c/w numerous epididymal cysts vs loculated hydrocele--following expectantly.    MEDICATIONS:  acetaminophen (TYLENOL) 500 MG tablet   aspirin EC 81 MG tablet   atorvastatin (LIPITOR) 40 MG tablet   carvedilol (COREG) 6.25 MG tablet   lisinopril (ZESTRIL) 5 MG tablet   melatonin 5 MG TABS   Multiple Vitamin (MULTIVITAMIN WITH MINERALS) TABS tablet   nitroGLYCERIN (NITROLINGUAL) 0.4 MG/SPRAY spray   omeprazole (PRILOSEC) 20 MG capsule   polyethylene glycol (MIRALAX / GLYCOLAX) 17 g packet   tadalafil (CIALIS) 5 MG tablet   ticagrelor (BRILINTA) 60 MG TABS tablet   No current facility-administered medications for this encounter.    Shonna Chock, PA-C Surgical Short Stay/Anesthesiology [facility] Phone [phone] [facility] Phone [phone] 09/12/2023 5:04 PM

## 2023-09-12 NOTE — Anesthesia Preprocedure Evaluation (Signed)
 Anesthesia Evaluation    Airway        Dental   Pulmonary former smoker          Cardiovascular hypertension,      Neuro/Psych    GI/Hepatic   Endo/Other    Renal/GU      Musculoskeletal   Abdominal   Peds  Hematology   Anesthesia Other Findings   Reproductive/Obstetrics                             Anesthesia Physical Anesthesia Plan  ASA:   Anesthesia Plan:    Post-op Pain Management:    Induction:   PONV Risk Score and Plan:   Airway Management Planned:   Additional Equipment:   Intra-op Plan:   Post-operative Plan:   Informed Consent:   Plan Discussed with:   Anesthesia Plan Comments: (PAT note written 09/12/2023 by Shonna Chock, PA-C.  )       Anesthesia Quick Evaluation

## 2023-09-13 ENCOUNTER — Other Ambulatory Visit: Payer: Self-pay | Admitting: Student

## 2023-09-13 NOTE — Progress Notes (Signed)
 Spoke with the pt, he will arrive tom at 0930. He understands to do NPO post midnight but can have clear liquids up to 0830.

## 2023-09-14 ENCOUNTER — Ambulatory Visit (HOSPITAL_COMMUNITY)
Admission: RE | Admit: 2023-09-14 | Discharge: 2023-09-14 | Disposition: A | Discharge status: Home / Self Care | Attending: Surgery | Admitting: Surgery

## 2023-09-14 ENCOUNTER — Ambulatory Visit (HOSPITAL_COMMUNITY): Payer: Self-pay | Admitting: Vascular Surgery

## 2023-09-14 ENCOUNTER — Other Ambulatory Visit: Payer: Self-pay

## 2023-09-14 ENCOUNTER — Encounter (HOSPITAL_COMMUNITY): Payer: Self-pay | Admitting: Surgery

## 2023-09-14 ENCOUNTER — Ambulatory Visit (HOSPITAL_BASED_OUTPATIENT_CLINIC_OR_DEPARTMENT_OTHER): Admitting: Anesthesiology

## 2023-09-14 ENCOUNTER — Encounter (HOSPITAL_COMMUNITY)
Admission: RE | Disposition: A | Discharge status: Home / Self Care | Payer: Self-pay | Source: Home / Self Care | Attending: Surgery

## 2023-09-14 DIAGNOSIS — H9190 Unspecified hearing loss, unspecified ear: Secondary | ICD-10-CM | POA: Diagnosis not present

## 2023-09-14 DIAGNOSIS — I252 Old myocardial infarction: Secondary | ICD-10-CM | POA: Insufficient documentation

## 2023-09-14 DIAGNOSIS — I2510 Atherosclerotic heart disease of native coronary artery without angina pectoris: Secondary | ICD-10-CM | POA: Insufficient documentation

## 2023-09-14 DIAGNOSIS — Z955 Presence of coronary angioplasty implant and graft: Secondary | ICD-10-CM | POA: Insufficient documentation

## 2023-09-14 DIAGNOSIS — I10 Essential (primary) hypertension: Secondary | ICD-10-CM | POA: Insufficient documentation

## 2023-09-14 DIAGNOSIS — Z87891 Personal history of nicotine dependence: Secondary | ICD-10-CM | POA: Diagnosis not present

## 2023-09-14 DIAGNOSIS — M1990 Unspecified osteoarthritis, unspecified site: Secondary | ICD-10-CM | POA: Insufficient documentation

## 2023-09-14 DIAGNOSIS — K432 Incisional hernia without obstruction or gangrene: Secondary | ICD-10-CM

## 2023-09-14 DIAGNOSIS — Z7982 Long term (current) use of aspirin: Secondary | ICD-10-CM | POA: Diagnosis not present

## 2023-09-14 DIAGNOSIS — Z7902 Long term (current) use of antithrombotics/antiplatelets: Secondary | ICD-10-CM | POA: Diagnosis not present

## 2023-09-14 DIAGNOSIS — Z79899 Other long term (current) drug therapy: Secondary | ICD-10-CM | POA: Insufficient documentation

## 2023-09-14 DIAGNOSIS — E119 Type 2 diabetes mellitus without complications: Secondary | ICD-10-CM

## 2023-09-14 DIAGNOSIS — H9319 Tinnitus, unspecified ear: Secondary | ICD-10-CM | POA: Diagnosis not present

## 2023-09-14 DIAGNOSIS — R7303 Prediabetes: Secondary | ICD-10-CM | POA: Diagnosis not present

## 2023-09-14 DIAGNOSIS — K219 Gastro-esophageal reflux disease without esophagitis: Secondary | ICD-10-CM | POA: Insufficient documentation

## 2023-09-14 SURGERY — REPAIR, HERNIA, VENTRAL
Anesthesia: General

## 2023-09-14 MED ORDER — ROCURONIUM BROMIDE 10 MG/ML (PF) SYRINGE
PREFILLED_SYRINGE | INTRAVENOUS | Status: DC | PRN
  Administered 2023-09-14: 80 mg via INTRAVENOUS

## 2023-09-14 MED ORDER — BUPIVACAINE-EPINEPHRINE 0.25% -1:200000 IJ SOLN
INTRAMUSCULAR | Status: DC | PRN
  Administered 2023-09-14: 10 mL

## 2023-09-14 MED ORDER — LIDOCAINE 2% (20 MG/ML) 5 ML SYRINGE
INTRAMUSCULAR | Status: DC | PRN
  Administered 2023-09-14: 60 mg via INTRAVENOUS

## 2023-09-14 MED ORDER — OXYCODONE HCL 5 MG PO TABS: 5.0000 mg | ORAL_TABLET | Freq: Once | ORAL | Status: DC | PRN

## 2023-09-14 MED ORDER — ACETAMINOPHEN 500 MG PO TABS
1000.0000 mg | ORAL_TABLET | Freq: Once | ORAL | Status: AC
Start: 2023-09-14 — End: 2023-09-14
  Administered 2023-09-14: 1000 mg via ORAL
  Filled 2023-09-14: qty 2

## 2023-09-14 MED ORDER — LACTATED RINGERS IV SOLN: INTRAVENOUS | Status: DC

## 2023-09-14 MED ORDER — FENTANYL CITRATE (PF) 250 MCG/5ML IJ SOLN
INTRAMUSCULAR | Status: AC
  Filled 2023-09-14: qty 5

## 2023-09-14 MED ORDER — PROPOFOL 10 MG/ML IV BOLUS
INTRAVENOUS | Status: DC | PRN
  Administered 2023-09-14: 150 mg via INTRAVENOUS

## 2023-09-14 MED ORDER — MIDAZOLAM HCL 2 MG/2ML IJ SOLN
INTRAMUSCULAR | Status: AC
  Filled 2023-09-14: qty 2

## 2023-09-14 MED ORDER — CHLORHEXIDINE GLUCONATE 0.12 % MT SOLN: 15.0000 mL | Freq: Once | OROMUCOSAL | Status: AC

## 2023-09-14 MED ORDER — PHENYLEPHRINE 80 MCG/ML (10ML) SYRINGE FOR IV PUSH (FOR BLOOD PRESSURE SUPPORT)
PREFILLED_SYRINGE | INTRAVENOUS | Status: DC | PRN
  Administered 2023-09-14 (×2): 160 ug via INTRAVENOUS

## 2023-09-14 MED ORDER — ONDANSETRON HCL 4 MG/2ML IJ SOLN
INTRAMUSCULAR | Status: DC | PRN
Start: 2023-09-14 — End: 2023-09-14
  Administered 2023-09-14: 4 mg via INTRAVENOUS

## 2023-09-14 MED ORDER — AMISULPRIDE (ANTIEMETIC) 5 MG/2ML IV SOLN: 10.0000 mg | Freq: Once | INTRAVENOUS | Status: DC | PRN

## 2023-09-14 MED ORDER — EPHEDRINE SULFATE-NACL 50-0.9 MG/10ML-% IV SOSY
PREFILLED_SYRINGE | INTRAVENOUS | Status: DC | PRN
  Administered 2023-09-14: 5 mg via INTRAVENOUS

## 2023-09-14 MED ORDER — CEFAZOLIN SODIUM 1 G IJ SOLR
INTRAMUSCULAR | Status: AC
  Filled 2023-09-14: qty 20

## 2023-09-14 MED ORDER — PROPOFOL 10 MG/ML IV BOLUS
INTRAVENOUS | Status: AC
  Filled 2023-09-14: qty 20

## 2023-09-14 MED ORDER — SUGAMMADEX SODIUM 200 MG/2ML IV SOLN
INTRAVENOUS | Status: DC | PRN
  Administered 2023-09-14: 320 mg via INTRAVENOUS

## 2023-09-14 MED ORDER — HYDROMORPHONE HCL 1 MG/ML IJ SOLN
INTRAMUSCULAR | Status: DC
Start: 2023-09-14 — End: 2023-09-14
  Filled 2023-09-14: qty 1

## 2023-09-14 MED ORDER — LIDOCAINE 2% (20 MG/ML) 5 ML SYRINGE
INTRAMUSCULAR | Status: AC
  Filled 2023-09-14: qty 15

## 2023-09-14 MED ORDER — ORAL CARE MOUTH RINSE: 15.0000 mL | Freq: Once | OROMUCOSAL | Status: AC

## 2023-09-14 MED ORDER — CHLORHEXIDINE GLUCONATE 0.12 % MT SOLN
15.0000 mL | Freq: Once | OROMUCOSAL | Status: AC
  Administered 2023-09-14: 15 mL via OROMUCOSAL
  Filled 2023-09-14: qty 15

## 2023-09-14 MED ORDER — PHENYLEPHRINE HCL-NACL 20-0.9 MG/250ML-% IV SOLN
INTRAVENOUS | Status: DC | PRN
  Administered 2023-09-14: 30 ug/min via INTRAVENOUS

## 2023-09-14 MED ORDER — FENTANYL CITRATE (PF) 250 MCG/5ML IJ SOLN
INTRAMUSCULAR | Status: DC | PRN
  Administered 2023-09-14: 100 ug via INTRAVENOUS

## 2023-09-14 MED ORDER — CHLORHEXIDINE GLUCONATE CLOTH 2 % EX PADS: 6.0000 | MEDICATED_PAD | Freq: Once | CUTANEOUS | Status: DC

## 2023-09-14 MED ORDER — DEXAMETHASONE SODIUM PHOSPHATE 10 MG/ML IJ SOLN
INTRAMUSCULAR | Status: AC
  Filled 2023-09-14: qty 2

## 2023-09-14 MED ORDER — PHENYLEPHRINE 80 MCG/ML (10ML) SYRINGE FOR IV PUSH (FOR BLOOD PRESSURE SUPPORT)
PREFILLED_SYRINGE | INTRAVENOUS | Status: AC
Start: 2023-09-14 — End: ?
  Filled 2023-09-14: qty 10

## 2023-09-14 MED ORDER — OXYCODONE HCL 5 MG PO TABS: 5.0000 mg | ORAL_TABLET | Freq: Four times a day (QID) | ORAL | 0 refills | Status: DC | PRN

## 2023-09-14 MED ORDER — ONDANSETRON HCL 4 MG/2ML IJ SOLN
INTRAMUSCULAR | Status: AC
  Filled 2023-09-14: qty 6

## 2023-09-14 MED ORDER — 0.9 % SODIUM CHLORIDE (POUR BTL) OPTIME
TOPICAL | Status: DC | PRN
  Administered 2023-09-14: 1000 mL

## 2023-09-14 MED ORDER — OXYCODONE HCL 5 MG/5ML PO SOLN: 5.0000 mg | Freq: Once | ORAL | Status: DC | PRN

## 2023-09-14 MED ORDER — HYDROMORPHONE HCL 1 MG/ML IJ SOLN
0.2500 mg | INTRAMUSCULAR | Status: DC | PRN
  Administered 2023-09-14 (×2): 0.5 mg via INTRAVENOUS

## 2023-09-14 MED ORDER — EPHEDRINE 5 MG/ML INJ
INTRAVENOUS | Status: AC
  Filled 2023-09-14: qty 5

## 2023-09-14 MED ORDER — MIDAZOLAM HCL 2 MG/2ML IJ SOLN
INTRAMUSCULAR | Status: DC | PRN
  Administered 2023-09-14 (×2): 1 mg via INTRAVENOUS

## 2023-09-14 MED ORDER — CEFAZOLIN SODIUM-DEXTROSE 2-4 GM/100ML-% IV SOLN
2.0000 g | INTRAVENOUS | Status: AC
  Administered 2023-09-14: 2 g via INTRAVENOUS
  Filled 2023-09-14: qty 100

## 2023-09-14 MED ORDER — ONDANSETRON HCL 4 MG/2ML IJ SOLN: 4.0000 mg | Freq: Once | INTRAMUSCULAR | Status: DC | PRN

## 2023-09-14 MED ORDER — ROCURONIUM BROMIDE 10 MG/ML (PF) SYRINGE
PREFILLED_SYRINGE | INTRAVENOUS | Status: AC
  Filled 2023-09-14: qty 20

## 2023-09-14 MED ORDER — BUPIVACAINE-EPINEPHRINE (PF) 0.25% -1:200000 IJ SOLN
INTRAMUSCULAR | Status: AC
  Filled 2023-09-14: qty 30

## 2023-09-14 SURGICAL SUPPLY — 34 items
BAG COUNTER SPONGE SURGICOUNT (BAG) ×1 IMPLANT
BENZOIN TINCTURE PRP APPL 2/3 (GAUZE/BANDAGES/DRESSINGS) ×1 IMPLANT
BINDER ABDOMINAL 12 ML 46-62 (SOFTGOODS) ×1 IMPLANT
BLADE CLIPPER SURG (BLADE) IMPLANT
CANISTER SUCT 3000ML PPV (MISCELLANEOUS) ×1 IMPLANT
CHLORAPREP W/TINT 26 (MISCELLANEOUS) IMPLANT
COVER SURGICAL LIGHT HANDLE (MISCELLANEOUS) ×1 IMPLANT
DRAPE LAPAROSCOPIC ABDOMINAL (DRAPES) ×1 IMPLANT
DRSG TEGADERM 4X4.75 (GAUZE/BANDAGES/DRESSINGS) ×1 IMPLANT
ELECT CAUTERY BLADE 6.4 (BLADE) ×1 IMPLANT
ELECT REM PT RETURN 9FT ADLT (ELECTROSURGICAL) ×1 IMPLANT
ELECTRODE REM PT RTRN 9FT ADLT (ELECTROSURGICAL) ×1 IMPLANT
GAUZE SPONGE 4X4 12PLY STRL (GAUZE/BANDAGES/DRESSINGS) ×1 IMPLANT
GLOVE BIO SURGEON STRL SZ7 (GLOVE) ×1 IMPLANT
GLOVE BIOGEL PI IND STRL 7.5 (GLOVE) ×1 IMPLANT
GOWN STRL REUS W/ TWL LRG LVL3 (GOWN DISPOSABLE) ×2 IMPLANT
KIT BASIN OR (CUSTOM PROCEDURE TRAY) ×1 IMPLANT
KIT TURNOVER KIT B (KITS) ×1 IMPLANT
MESH VENTRALEX ST 1-7/10 CRC S (Mesh General) IMPLANT
NDL HYPO 25GX1X1/2 BEV (NEEDLE) ×1 IMPLANT
NEEDLE HYPO 25GX1X1/2 BEV (NEEDLE) ×1 IMPLANT
NS IRRIG 1000ML POUR BTL (IV SOLUTION) ×1 IMPLANT
PACK GENERAL/GYN (CUSTOM PROCEDURE TRAY) ×1 IMPLANT
PAD ARMBOARD POSITIONER FOAM (MISCELLANEOUS) ×2 IMPLANT
PENCIL SMOKE EVACUATOR (MISCELLANEOUS) ×1 IMPLANT
STRIP CLOSURE SKIN 1/2X4 (GAUZE/BANDAGES/DRESSINGS) ×1 IMPLANT
SUT MNCRL AB 4-0 PS2 18 (SUTURE) ×1 IMPLANT
SUT NOVA NAB DX-16 0-1 5-0 T12 (SUTURE) ×1 IMPLANT
SUT NOVA NAB GS-21 0 18 T12 DT (SUTURE) ×1 IMPLANT
SUT VIC AB 3-0 SH 27XBRD (SUTURE) ×1 IMPLANT
SYR CONTROL 10ML LL (SYRINGE) ×1 IMPLANT
TOWEL GREEN STERILE (TOWEL DISPOSABLE) ×1 IMPLANT
TOWEL GREEN STERILE FF (TOWEL DISPOSABLE) ×1 IMPLANT
TRAY FOLEY MTR SLVR 14FR STAT (SET/KITS/TRAYS/PACK) IMPLANT

## 2023-09-14 NOTE — Anesthesia Procedure Notes (Signed)
 Procedure Name: Intubation Date/Time: 09/14/2023 11:48 AM  Performed by: Audie Pinto, CRNAPre-anesthesia Checklist: Patient identified, Emergency Drugs available, Suction available and Patient being monitored Patient Re-evaluated:Patient Re-evaluated prior to induction Oxygen Delivery Method: Circle system utilized Preoxygenation: Pre-oxygenation with 100% oxygen Induction Type: IV induction Ventilation: Mask ventilation without difficulty and Oral airway inserted - appropriate to patient size Laryngoscope Size: Mac and 4 Grade View: Grade II Tube type: Oral Tube size: 7.5 mm Number of attempts: 1 Airway Equipment and Method: Stylet and Oral airway Placement Confirmation: ETT inserted through vocal cords under direct vision, positive ETCO2 and breath sounds checked- equal and bilateral Secured at: 22 cm Tube secured with: Tape Dental Injury: Teeth and Oropharynx as per pre-operative assessment

## 2023-09-14 NOTE — Op Note (Signed)
 Indications:  This is a 68 year old male who is status post robotic radical prostatectomy 02/27/2023 at [facility]. The patient has an incision above his umbilicus. Over the last couple of weeks, he has noticed a bulge begin to develop in this area. This causes some mild discomfort. It remains reducible. He denies any obstructive symptoms.   Pre-operative diagnosis:  Ventral incisional hernia  Post-operative diagnosis:  Same  Procedure:  Ventral incisional hernia repair with mesh  Findings:  2 cm ventral fascial defect  Procedure Details  The patient was seen again in the Holding Room. The risks, benefits, complications, treatment options, and expected outcomes were discussed with the patient. The possibilities of reaction to medication, pulmonary aspiration, perforation of viscus, bleeding, recurrent infection, the need for additional procedures, and development of a complication requiring transfusion or further operation were discussed with the patient and/or family. There was concurrence with the proposed plan, and informed consent was obtained. The site of surgery was properly noted/marked. The patient was taken to the Operating Room, identified as Ricky Chavez, and the procedure verified as umbilical hernia repair. A Time Out was held and the above information confirmed.  After an adequate level of general anesthesia was obtained, the patient's abdomen was prepped with Chloraprep and draped in sterile fashion.  We made a transverse incision through his old transverse incision above the umbilicus.  Dissection was carried down to the hernia sac with cautery.  We dissected bluntly around the hernia sac down to the edge of the fascial defect.  The hernia sac was opened and contained only some omentum.  The hernia sac was amputated and the omentum reduced.  The fascial defect measured 2 cm.  We cleared the fascia in all directions.  A small Ventralex ST mesh was inserted into the  pre-peritoneal space and was deployed.  The mesh was secured with four trans-fascial sutures of 0 Novofil.  The fascial defect was closed with multiple interrupted figure-of-eight 1 Novofil sutures.  3-0 Vicryl was used to close the subcutaneous tissues and 4-0 Monocryl was used to close the skin.  Steri-strips and clean dressing were applied.  The patient was extubated and brought to the recovery room in stable condition.  All sponge, instrument, and needle counts were correct prior to closure and at the conclusion of the case.   Estimated Blood Loss: Minimal          Complications: None; patient tolerated the procedure well.         Disposition: PACU - hemodynamically stable.         Condition: stable  Ricky Chavez. Ricky Skains, MD, [facility]  General Surgery   09/14/2023 12:13 PM

## 2023-09-14 NOTE — Anesthesia Postprocedure Evaluation (Signed)
 Anesthesia Post Note  Patient: [name]  Procedure(s) Performed: REPAIR, HERNIA, VENTRAL     Patient location during evaluation: PACU Anesthesia Type: General Level of consciousness: awake and alert, oriented and patient cooperative Pain management: pain level controlled Vital Signs Assessment: post-procedure vital signs reviewed and stable Respiratory status: spontaneous breathing, nonlabored ventilation and respiratory function stable Cardiovascular status: blood pressure returned to baseline and stable Postop Assessment: no apparent nausea or vomiting Anesthetic complications: no   No notable events documented.  Last Vitals:  Vitals:   09/14/23 1245 09/14/23 1300  BP: 120/67 110/69  Pulse: (!) 48 (!) 49  Resp: 17 11  Temp:    SpO2: 93% 94%    Last Pain:  Vitals:   09/14/23 1300  TempSrc:   PainSc: 2                  Lannie Fields

## 2023-09-14 NOTE — Transfer of Care (Signed)
 Immediate Anesthesia Transfer of Care Note  Patient: Ricky Chavez  Procedure(s) Performed: REPAIR, HERNIA, VENTRAL  Patient Location: PACU  Anesthesia Type:General  Level of Consciousness: drowsy and patient cooperative  Airway & Oxygen Therapy: Patient Spontanous Breathing  Post-op Assessment: Report given to RN, Post -op Vital signs reviewed and stable, and Patient moving all extremities X 4  Post vital signs: Reviewed and stable  Last Vitals:  Vitals Value Taken Time  BP 131/81 09/14/23 1226  Temp    Pulse 47 09/14/23 1228  Resp 19 09/14/23 1228  SpO2 96 % 09/14/23 1228  Vitals shown include unfiled device data.  Last Pain:  Vitals:   09/14/23 0954  TempSrc:   PainSc: 0-No pain      Patients Stated Pain Goal: 0 (09/14/23 0954)  Complications: No notable events documented.

## 2023-09-14 NOTE — Progress Notes (Signed)
 Dr. Corliss Skains made aware that the patient took Aspirin 81 mg this morning. Patient stopped taking Brilinta on 09/08/23 but did not stop taking Aspirin per cardiologist recommendation.

## 2023-09-14 NOTE — Interval H&P Note (Signed)
 History and Physical Interval Note:  09/14/2023 10:39 AM  Ricky Chavez  has presented today for surgery, with the diagnosis of VENTRAL INCISIONAL HERNIA.  The various methods of treatment have been discussed with the patient and family. After consideration of risks, benefits and other options for treatment, the patient has consented to  Procedure(s) with comments: REPAIR, HERNIA, VENTRAL (N/A) - OPEN VENTRAL INCISIONAL HERNIA REAPIR WITH MESH as a surgical intervention.  The patient's history has been reviewed, patient examined, no change in status, stable for surgery.  I have reviewed the patient's chart and labs.  Questions were answered to the patient's satisfaction.     Wynona Luna

## 2023-09-14 NOTE — Discharge Instructions (Signed)
 [facility] _______[facility]  VENTRAL HERNIA REPAIR: POST OP INSTRUCTIONS  Always review your discharge instruction sheet given to you by the facility where your surgery was performed. IF YOU HAVE DISABILITY OR FAMILY LEAVE FORMS, YOU MUST BRING THEM TO THE OFFICE FOR PROCESSING.   DO NOT GIVE THEM TO YOUR DOCTOR.  1. A  prescription for pain medication may be given to you upon discharge.  Take your pain medication as prescribed, if needed.  If narcotic pain medicine is not needed, then you may take acetaminophen (Tylenol) or ibuprofen (Advil) as needed. 2. Take your usually prescribed medications unless otherwise directed. If you need a refill on your pain medication, please contact your pharmacy.  They will contact our office to request authorization. Prescriptions will not be filled after 5 pm or on week-ends. 3. You should follow a light diet the first 24 hours after arrival home, such as soup and crackers, etc.  Be sure to include lots of fluids daily.  Resume your normal diet the day after surgery. 4.Most patients will experience some swelling and bruising around the incision.  Ice packs and reclining will help.  Swelling and bruising can take several days to resolve.  6. It is common to experience some constipation if taking pain medication after surgery.  Increasing fluid intake and taking a stool softener (such as Colace) will usually help or prevent this problem from occurring.  A mild laxative (Milk of Magnesia or Miralax) should be taken according to package directions if there are no bowel movements after 48 hours. 7. Unless discharge instructions indicate otherwise, you may remove your bandages 24-48 hours after surgery, and you may shower at that time.  You may have steri-strips (small skin tapes) in place directly over the incision.  These strips should be left on the skin for 7-10 days.   8. ACTIVITIES:  You may resume regular (light) daily activities beginning the next  day--such as daily self-care, walking, climbing stairs--gradually increasing activities as tolerated.  You may have sexual intercourse when it is comfortable.  Refrain from any heavy lifting or straining until approved by your doctor.  a.You may drive when you are no longer taking prescription pain medication, you can comfortably wear a seatbelt, and you can safely maneuver your car and apply brakes. b.RETURN TO WORK:   _____________________________________________  9.You should see your doctor in the office for a follow-up appointment approximately 2-3 weeks after your surgery.  Make sure that you call for this appointment within a day or two after you arrive home to insure a convenient appointment time. 10.OTHER INSTRUCTIONS: _________________________    _____________________________________  WHEN TO CALL YOUR DOCTOR: Fever over 101.0 Inability to urinate Nausea and/or vomiting Extreme swelling or bruising Continued bleeding from incision. Increased pain, redness, or drainage from the incision  The clinic staff is available to answer your questions during regular business hours.  Please don't hesitate to call and ask to speak to one of the nurses for clinical concerns.  If you have a medical emergency, go to the nearest emergency room or call 911.  A surgeon from [facility] is always on call at the hospital   [address], [place], [place]  [address] ?  [address] [address], [place], [place]   [address] [phone] ? [phone] ? FAX [phone] Web site: [email or web address]

## 2023-09-15 ENCOUNTER — Encounter (HOSPITAL_COMMUNITY): Payer: Self-pay | Admitting: Surgery

## 2023-09-15 LAB — SURGICAL PATHOLOGY

## 2023-09-27 ENCOUNTER — Encounter: Payer: Self-pay | Admitting: Family Medicine

## 2023-10-10 ENCOUNTER — Encounter: Payer: Medicare Other | Attending: Cardiology | Admitting: Emergency Medicine

## 2023-10-10 VITALS — BP 124/65 | HR 54 | Resp 16

## 2023-10-10 DIAGNOSIS — E785 Hyperlipidemia, unspecified: Secondary | ICD-10-CM | POA: Diagnosis present

## 2023-10-10 DIAGNOSIS — I25810 Atherosclerosis of coronary artery bypass graft(s) without angina pectoris: Secondary | ICD-10-CM | POA: Insufficient documentation

## 2023-10-10 MED ORDER — INCLISIRAN SODIUM 284 MG/1.5ML ~~LOC~~ SOSY
284.0000 mg | PREFILLED_SYRINGE | Freq: Once | SUBCUTANEOUS | Status: AC
  Administered 2023-10-10: 284 mg via SUBCUTANEOUS

## 2023-10-10 NOTE — Progress Notes (Signed)
 Diagnosis: Hyperlipidemia  Provider:   Eilleen Grates, MD   Procedure: Injection  Leqvio  (inclisiran), Dose: 284 mg, Site: subcutaneous, Number of injections: 1  Injection Site(s): Left upper quad. Abdomen Pt gets lightheaded right after injection.  Subsides within 5 minutes.  Pt given bottle of water . No other signs or symptoms at discharge.  Post Care: Patient declined observation  Discharge: Condition: Good, Destination: Home . AVS Provided  Performed by:  Arlina Benjamin, RN

## 2023-10-11 ENCOUNTER — Ambulatory Visit: Admitting: Family Medicine

## 2023-10-25 ENCOUNTER — Telehealth: Payer: Self-pay | Admitting: Cardiology

## 2023-10-25 NOTE — Telephone Encounter (Signed)
 Patient called to get orders for lab work to be done prior to his 9/10 visit.

## 2023-10-26 ENCOUNTER — Other Ambulatory Visit: Payer: Self-pay | Admitting: Student

## 2023-10-27 NOTE — Telephone Encounter (Signed)
 Ricky Grates, MD  [facility] Triage20 minutes ago (12:51 PM)    Not that I am seeing   Called pt and made aware that no labs are needed at this time.

## 2023-12-07 ENCOUNTER — Other Ambulatory Visit: Payer: Self-pay | Admitting: Student

## 2023-12-29 ENCOUNTER — Encounter: Payer: Self-pay | Admitting: Family Medicine

## 2024-01-02 ENCOUNTER — Ambulatory Visit (INDEPENDENT_AMBULATORY_CARE_PROVIDER_SITE_OTHER): Payer: Medicare Other | Admitting: Family Medicine

## 2024-01-02 ENCOUNTER — Encounter: Payer: Self-pay | Admitting: Family Medicine

## 2024-01-02 ENCOUNTER — Ambulatory Visit: Payer: Self-pay | Admitting: Family Medicine

## 2024-01-02 VITALS — BP 75/44 | HR 52 | Temp 98.1°F | Ht 69.0 in | Wt 167.2 lb

## 2024-01-02 DIAGNOSIS — M7701 Medial epicondylitis, right elbow: Secondary | ICD-10-CM

## 2024-01-02 DIAGNOSIS — E7800 Pure hypercholesterolemia, unspecified: Secondary | ICD-10-CM

## 2024-01-02 DIAGNOSIS — Z23 Encounter for immunization: Secondary | ICD-10-CM | POA: Diagnosis not present

## 2024-01-02 DIAGNOSIS — I10 Essential (primary) hypertension: Secondary | ICD-10-CM | POA: Diagnosis not present

## 2024-01-02 DIAGNOSIS — R7303 Prediabetes: Secondary | ICD-10-CM

## 2024-01-02 LAB — COMPREHENSIVE METABOLIC PANEL WITH GFR
ALT: 35 U/L (ref 0–53)
AST: 31 U/L (ref 0–37)
Albumin: 4.3 g/dL (ref 3.5–5.2)
Alkaline Phosphatase: 61 U/L (ref 39–117)
BUN: 20 mg/dL (ref 6–23)
CO2: 28 meq/L (ref 19–32)
Calcium: 9.2 mg/dL (ref 8.4–10.5)
Chloride: 103 meq/L (ref 96–112)
Creatinine, Ser: 1.03 mg/dL (ref 0.40–1.50)
GFR: 75.51 mL/min (ref >60.00)
Glucose, Bld: 102 mg/dL — ABNORMAL HIGH (ref 70–99)
Potassium: 4.4 meq/L (ref 3.5–5.1)
Sodium: 139 meq/L (ref 135–145)
Total Bilirubin: 0.8 mg/dL (ref 0.2–1.2)
Total Protein: 6.4 g/dL (ref 6.0–8.3)

## 2024-01-02 LAB — HEMOGLOBIN A1C: Hgb A1c MFr Bld: 5.9 % (ref 4.6–6.5)

## 2024-01-02 LAB — LIPID PANEL
Cholesterol: 86 mg/dL (ref 0–200)
HDL: 45.7 mg/dL (ref >39.00)
LDL Cholesterol: 28 mg/dL (ref 0–99)
NonHDL: 40.45
Total CHOL/HDL Ratio: 2
Triglycerides: 62 mg/dL (ref 0.0–149.0)
VLDL: 12.4 mg/dL (ref 0.0–40.0)

## 2024-01-02 NOTE — Progress Notes (Signed)
 OFFICE VISIT  01/02/2024  CC:  Chief Complaint  Patient presents with   Medical Management of Chronic Issues    Pt is fasting    Patient is a 68 y.o. male who presents for 24-month follow-up hypertension, hyperlipidemia, and prediabetes. A/P as of last visit: 1 hypertension, well-controlled on lisinopril  5 mg a day and carvedilol  6.25 mg twice daily. Electrolytes and creatinine today.   #2  Hypercholesterolemia.  Doing well on Lipitor 40 mg a day. LDL goal less than 70. LDL was 50 about 6 mo ago. Lipid panel, LP(a), and hepatic panel today. Will forward results to Dr. Lavona to make him aware.   3) Prediabetes, Hba1c 5.7% about 6 mo ago. Hba1c today.  INTERIM HX: Ricky Chavez feels well other than some right elbow pain recently.  Points to medial epicondyle. No recent overuse.  He does workout with weights but has not changed his routine lately. Has been present about 3 weeks now.  Home blood pressures are typically in the low normal range. He does not feel lightheaded or have generalized weakness.  Of note, he is now on Leqvio  in addition to his atorvastatin  for hypercholesterolemia.  ROS as above, plus--> no fevers, no CP, no SOB, no wheezing, no cough, no dizziness, no HAs, no rashes, no melena/hematochezia.  No polyuria or polydipsia.  .  No focal weakness, paresthesias, or tremors.  No acute vision or hearing abnormalities.  No dysuria or unusual/new urinary urgency or frequency.  No recent changes in lower legs. No n/v/d or abd pain.  No palpitations.    Past Medical History:  Diagnosis Date   Allergic rhinitis    Arthritis    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  Needs DAPT lifetime due to high risk status.   Elevated PSA onset @ 2009   Neg bx x 3.  MRI c/w bph only.  PSA inc 2024-->bx showed prostate  adenocarcinoma 01/2023.   GERD (gastroesophageal reflux disease)    Hearing loss of both ears    History of meniscal tear 2023   L knee   History of myocardial infarction 2011   History of robot-assisted laparoscopic radical prostatectomy 02/27/2023   Performed at [facility] by Dr Ranell Constable     Hyperlipidemia    very elevated lipoprotein a   Hypertension    Insomnia    Myocardial infarction [facility]) 2011   Organic impotence    Palpitations 05/2018   PVCs   Pre-diabetes    Prediabetes    fasting gluc 114 and Hba1c 5.9% Jan 2025   Prostate cancer [facility])    radical prostatectomy 03/2023   Sciatica    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  May need MRI L spine and/or emg/NCS   Squamous cell carcinoma     Past Surgical History:  Procedure Laterality Date   AAA screening     normal 04/2023   CARDIAC CATHETERIZATION  01/31/2013   Stents to LAD patent; small diagonal stent with 90% ostial stenosis--good overall LV function with mild anterior and focal inferoapical hypokinesis, EF 60-65%.  Unsuccessful angioplasty of diagonal.   CARDIOVASCULAR STRESS TEST  05/26/2015   stress echo neg for ischemia   CARDIOVASCULAR STRESS TEST  10/04/2017   Myoc perf imaging: normal (EF 59%)   COLONOSCOPY  2008;  06/14/16   2008 Normal.  2018 hyperplastic polyp +internal hemorrhoids.  Recall 2028.   CYSTOSCOPY WITH INSERTION OF UROLIFT N/A 10/04/2021   Procedure: CYSTOSCOPY WITH INSERTION OF UROLIFT;  Surgeon: Sherrilee Belvie CROME, MD;  Location: [facility];  Service: Urology;  Laterality: N/A;   HYDROCELE EXCISION / REPAIR  early 2000's   INGUINAL HERNIA REPAIR Bilateral 1998   PROSTATE BIOPSY     Multiple; all neg.  Prostate MRI 2018 -NO SIGN OF PROSTATE CA.SABRA Rpt bx 12/2022->results pending as of 12/22/22   PTCA     Cath 2011---4 stents--LAD and diagonal.  2014 repeat cath for CP showed no changes.   ROBOT ASSISTED LAPAROSCOPIC RADICAL  PROSTATECTOMY  02/27/2023   [name], Ranell Sor, MD   Scrotal u/s  01/2015   numerous extratesticular cystic lesions bilat, c/w numerous epididymal cysts vs loculated hydrocele--following expectantly.   VENTRAL HERNIA REPAIR N/A 09/14/2023   Procedure: REPAIR, HERNIA, VENTRAL;  Surgeon: Belinda Cough, MD;  Location: [facility] OR;  Service: General;  Laterality: N/A;  OPEN VENTRAL INCISIONAL HERNIA REPAIR WITH MESH    Outpatient Medications Prior to Visit  Medication Sig Dispense Refill   acetaminophen  (TYLENOL ) 500 MG tablet Take 500-1,000 mg by mouth every 6 (six) hours as needed (pain.).     aspirin EC 81 MG tablet Take 81 mg by mouth in the morning.     atorvastatin  (LIPITOR) 40 MG tablet TAKE 1 TABLET BY MOUTH DAILY 90 tablet 0   BRILINTA  60 MG TABS tablet TAKE 1 TABLET BY MOUTH TWICE  DAILY 180 tablet 3   carvedilol  (COREG ) 6.25 MG tablet TAKE 1 TABLET BY MOUTH TWICE  DAILY 180 tablet 1   diphenhydrAMINE (BENADRYL) 25 mg capsule Take 25 mg by mouth every 6 (six) hours as needed.     lisinopril  (ZESTRIL ) 5 MG tablet TAKE 1 TABLET BY MOUTH DAILY 90 tablet 1   melatonin 5 MG TABS Take 5 mg by mouth at bedtime.     Multiple Vitamin (MULTIVITAMIN WITH MINERALS) TABS tablet Take 1 tablet by mouth in the morning.     nitroGLYCERIN  (NITROLINGUAL ) 0.4 MG/SPRAY spray Place 1 spray under the tongue every 5 (five) minutes x 3 doses as needed for chest pain. 12 g 3   omeprazole (PRILOSEC) 20 MG capsule Take 20 mg by mouth daily before breakfast.     oxyCODONE  (OXY IR/ROXICODONE ) 5 MG immediate release tablet Take 1 tablet (5 mg total) by mouth every 6 (six) hours as needed for severe pain (pain score 7-10). 20 tablet 0   polyethylene glycol (MIRALAX / GLYCOLAX) 17 g packet Take 17 g by mouth daily with breakfast.     tadalafil (CIALIS) 5 MG tablet Take 5 mg by mouth in the morning.     UNABLE TO FIND 0.2 mLs by Intracavernosal route as needed. Bimix for intracorporeal (penile) injections for erectile  dysfunction. Papaverine 30 mg/cc and Phentolamine 5 mg/cc. Dispense 10 ml total     No facility-administered medications prior to visit.    Allergies  Allergen Reactions   Keflex [Cephalexin] Other (See Comments)    Cause the patient to develop Cdiff     Review of Systems As per HPI  PE:    01/02/2024    8:56 AM 10/10/2023    9:34 AM 09/14/2023    1:00 PM  Vitals with BMI  Height 5' 9    Weight 167 lbs 3 oz    BMI 24.68    Systolic 75 124 110  Diastolic  44 65 69  Pulse 52 54 49     Physical Exam  Gen: Alert, well appearing.  Patient is oriented to person, place, time, and situation. AFFECT: pleasant, lucid thought and speech. Right elbow without swelling or erythema.  Medial epicondyle is tender to palpation.  He has some pain with resisted wrist flexion and resisted forearm pronation.  LABS:  Last CBC Lab Results  Component Value Date   WBC 7.4 09/11/2023   HGB 15.1 09/11/2023   HCT 44.3 09/11/2023   MCV 91.2 09/11/2023   MCH 31.1 09/11/2023   RDW 13.0 09/11/2023   PLT 163 09/11/2023   Last metabolic panel Lab Results  Component Value Date   GLUCOSE 100 (H) 09/11/2023   NA 139 09/11/2023   K 4.2 09/11/2023   CL 106 09/11/2023   CO2 25 09/11/2023   BUN 15 09/11/2023   CREATININE 1.02 09/11/2023   GFRNONAA >60 09/11/2023   CALCIUM  9.3 09/11/2023   PROT 6.8 06/26/2023   ALBUMIN 4.4 06/26/2023   BILITOT 0.7 06/26/2023   ALKPHOS 61 06/26/2023   AST 22 06/26/2023   ALT 21 06/26/2023   ANIONGAP 8 09/11/2023   Last lipids Lab Results  Component Value Date   CHOL 124 06/26/2023   HDL 42.40 06/26/2023   LDLCALC 65 06/26/2023   TRIG 83.0 06/26/2023   CHOLHDL 3 06/26/2023   Last hemoglobin A1c Lab Results  Component Value Date   HGBA1C 5.9 06/26/2023   Last thyroid functions Lab Results  Component Value Date   TSH 1.25 12/21/2021   IMPRESSION AND PLAN:  1 hypertension, well-controlled on lisinopril  5 mg a day and carvedilol  6.25 mg twice  daily. Blood pressure has been low normal consistently, is low in the office today but he feels well. Will not make any medication changes today. Electrolytes and creatinine today.   #2  Hypercholesterolemia.  Doing well on Lipitor 40 mg a day plus cardiology recently added Leqvio . Monitor lipid panel and hepatic panel today.  Will forward results to Dr. Lavona to make him aware.   3) Prediabetes, Hba1c 5.9% about 6 mo ago. Hba1c today.  #4 right elbow medial epicondylitis. Discussed relative rest, ice, gentle home rehab exercises. Discussed return for injection if not significantly improving in a few weeks.  (Bedside [facility] ultrasound right elbow today: Question small anechoic defect in the substance of the common flexor tendon near its insertion on the medial epicondyle.  Otherwise tendon is normal size and echogenicity.  No neovascularity noted. UCL normal.)  An After Visit Summary was printed and given to the patient.  FOLLOW UP: Return in about 6 months (around 07/04/2024) for routine chronic illness f/u.  Signed:  Gerlene Hockey, MD           01/02/2024

## 2024-01-08 ENCOUNTER — Encounter: Payer: Self-pay | Admitting: Cardiology

## 2024-02-08 ENCOUNTER — Encounter: Payer: Self-pay | Admitting: Cardiology

## 2024-02-08 ENCOUNTER — Encounter: Payer: Self-pay | Admitting: *Deleted

## 2024-02-09 ENCOUNTER — Ambulatory Visit: Admitting: Cardiology

## 2024-02-11 ENCOUNTER — Other Ambulatory Visit: Payer: Self-pay | Admitting: Cardiology

## 2024-02-11 NOTE — Progress Notes (Unsigned)
  Cardiology Office Note:   Date:  02/11/2024  ID:  Ricky Chavez, DOB 10/02/1955, MRN [id number] PCP: Candise Aleene DEL, MD  [facility] Providers Cardiologist:  Lynwood Schilling, MD {  History of Present Illness:   Ricky Chavez is a 68 y.o. male who presents for follow up of CAD.  The patient was treated at [facility] for CAD in late 2016.  He had stents to LAD patent; small diagonal stent with 90% ostial stenosis--good overall LV function with mild anterior and focal inferoapical hypokinesis, EF 60-65%.  He had unsuccessful angioplasty of the diagonal.  He had a stress echo in 2016 December that was negative for any evidence of ischemia. The patient had a complicated course with his initial angioplasty.  In 2019 he had chest pain.  He had a low risk perfusion study. We talked to him earlier this year prior to umbilical hernia repair.  ***   **  Since he was last seen he has been diagnosed with prostate cancer and is going to have surgery or seed implants.  The patient denies any new symptoms such as chest discomfort, neck or arm discomfort. There has been no new shortness of breath, PND or orthopnea. There have been no reported palpitations, presyncope or syncope.  He has been exercising routinely.  He goes to the gym a couple times per week.  With this she has had no chest pressure, neck or arm discomfort.  He has no symptoms since his stress test in 2019.  ROS: ***  Studies Reviewed:    EKG:       ***  Risk Assessment/Calculations:   {Does this patient have ATRIAL FIBRILLATION?:[id number]} No BP recorded.  {Refresh Note OR Click here to enter BP  :1}***        Physical Exam:   VS:  There were no vitals taken for this visit.   Wt Readings from Last 3 Encounters:  01/02/24 167 lb 3.2 oz (75.8 kg)  09/14/23 165 lb (74.8 kg)  09/11/23 168 lb 14.4 oz (76.6 kg)     GEN: Well nourished, well developed in no acute distress NECK: No JVD; No carotid bruits CARDIAC: ***RR, ***  murmurs, rubs, gallops RESPIRATORY:  Clear to auscultation without rales, wheezing or rhonchi  ABDOMEN: Soft, non-tender, non-distended EXTREMITIES:  No edema; No deformity   ASSESSMENT AND PLAN:   CAD:  ***  The patient has no new sypmtoms.  No further cardiovascular testing is indicated.  We will continue with aggressive risk reduction and meds as listed.   DYSLIPIDEMIA: LDL was ***  50 with an HDL of 48.  No change in therapy. He gets blood work drawn I would like for him to have an LP(a).   HTN:    Blood pressure is ** at target.  No change in therapy.  In the system for powerP   Follow up ***  Signed, Lynwood Schilling, MD

## 2024-02-14 ENCOUNTER — Encounter: Payer: Self-pay | Admitting: Cardiology

## 2024-02-14 ENCOUNTER — Ambulatory Visit (INDEPENDENT_AMBULATORY_CARE_PROVIDER_SITE_OTHER): Admitting: Cardiology

## 2024-02-14 VITALS — BP 130/68 | HR 48 | Ht 68.0 in | Wt 169.0 lb

## 2024-02-14 DIAGNOSIS — I10 Essential (primary) hypertension: Secondary | ICD-10-CM | POA: Diagnosis not present

## 2024-02-14 DIAGNOSIS — I25810 Atherosclerosis of coronary artery bypass graft(s) without angina pectoris: Secondary | ICD-10-CM | POA: Diagnosis not present

## 2024-02-14 DIAGNOSIS — E785 Hyperlipidemia, unspecified: Secondary | ICD-10-CM | POA: Diagnosis not present

## 2024-02-14 NOTE — Patient Instructions (Signed)
 Medication Instructions:  Stop Aspirin  *If you need a refill on your cardiac medications before your next appointment, please call your pharmacy*  Lab Work: NONE If you have labs (blood work) drawn today and your tests are completely normal, you will receive your results only by: MyChart Message (if you have MyChart) OR A paper copy in the mail If you have any lab test that is abnormal or we need to change your treatment, we will call you to review the results.  Testing/Procedures: NONE  Follow-Up: At [facility], you and your health needs are our priority.  As part of our continuing mission to provide you with exceptional heart care, our providers are all part of one team.  This team includes your primary Cardiologist (physician) and Advanced Practice Providers or APPs (Physician Assistants and Nurse Practitioners) who all work together to provide you with the care you need, when you need it.  Your next appointment:   1 year(s)  Provider:   Lynwood Schilling, MD    We recommend signing up for the patient portal called MyChart.  Sign up information is provided on this After Visit Summary.  MyChart is used to connect with patients for Virtual Visits (Telemedicine).  Patients are able to view lab/test results, encounter notes, upcoming appointments, etc.  Non-urgent messages can be sent to your provider as well.   To learn more about what you can do with MyChart, go to [email or web address].

## 2024-02-19 ENCOUNTER — Telehealth: Payer: Self-pay

## 2024-02-19 DIAGNOSIS — E785 Hyperlipidemia, unspecified: Secondary | ICD-10-CM

## 2024-02-19 NOTE — Telephone Encounter (Signed)
-----   Message from Lynwood Schilling sent at 02/18/2024  5:05 PM EDT ----- Aleck,  Will you send this response from our lipid expert  to Mr Klatt for me.  Thanks ----- Message ----- From: Mona Vinie BROCKS, MD Sent: 02/15/2024   1:17 PM EDT To: Lynwood Schilling, MD  He won't qualify for the [employer] prevent trial due to prior PCI.  I think it is unlikely they will allow the medicine to be given for compassionate care since it is actively being studied and elevated LP(a) is not an imminently life-threatening condition.  His best best may be to try to get in the [employer] study for lepodisiran, if still enrolling - I know that [facility] and [facility] had patients in that trial with known CAD (ACCLAIM-LPa).  -[place] ----- Message ----- From: Schilling Lynwood, MD Sent: 02/14/2024  12:13 PM EDT To: Vinie BROCKS Mona, MD  This patient either really wants to be in a clinical trial for LPa or have me ask for compassionate release of the Norvatis drug.  What do you recommend.  Thanks.

## 2024-02-23 ENCOUNTER — Other Ambulatory Visit

## 2024-02-23 DIAGNOSIS — C61 Malignant neoplasm of prostate: Secondary | ICD-10-CM

## 2024-02-24 LAB — PSA: Prostate Specific Ag, Serum: <0.1 ng/mL (ref 0.0–4.0)

## 2024-02-26 NOTE — Addendum Note (Signed)
 Addended by: Alonda Weaber N on: 02/26/2024 05:51 PM   Modules accepted: Orders

## 2024-02-27 ENCOUNTER — Ambulatory Visit: Payer: Self-pay | Admitting: Urology

## 2024-03-01 ENCOUNTER — Ambulatory Visit: Admitting: Urology

## 2024-03-01 ENCOUNTER — Encounter: Payer: Self-pay | Admitting: Urology

## 2024-03-01 VITALS — BP 111/66 | HR 51

## 2024-03-01 DIAGNOSIS — N5231 Erectile dysfunction following radical prostatectomy: Secondary | ICD-10-CM

## 2024-03-01 DIAGNOSIS — C61 Malignant neoplasm of prostate: Secondary | ICD-10-CM | POA: Diagnosis not present

## 2024-03-01 LAB — URINALYSIS, ROUTINE W REFLEX MICROSCOPIC
Bilirubin, UA: NEGATIVE
Glucose, UA: NEGATIVE
Ketones, UA: NEGATIVE
Leukocytes,UA: NEGATIVE
Nitrite, UA: POSITIVE — AB
Protein,UA: NEGATIVE
RBC, UA: NEGATIVE
Specific Gravity, UA: 1.02 (ref 1.005–1.030)
Urobilinogen, Ur: 0.2 mg/dL (ref 0.2–1.0)
pH, UA: 6 (ref 5.0–7.5)

## 2024-03-01 LAB — MICROSCOPIC EXAMINATION: Bacteria, UA: NONE SEEN

## 2024-03-01 NOTE — Patient Instructions (Signed)

## 2024-03-01 NOTE — Progress Notes (Signed)
 03/01/2024 8:55 AM   Ricky Chavez 1955/08/25 [id number]  Referring provider: Candise Aleene DEL, MD [address] [place],  [place] [address]     HPI: Ricky Chavez is a 68yo here for followup for prostate cancer and erectile dysfunction. PSA remains undetectable.  He uses Bimix with mixed results. He has tried a vacuum erection device with mixed results.    PMH: Past Medical History:  Diagnosis Date   Allergic rhinitis    Arthritis    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  Needs DAPT lifetime due to high risk status.   Elevated PSA onset @ 2009   Neg bx x 3.  MRI c/w bph only.  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   GERD (gastroesophageal reflux disease)    Hearing loss of both ears    History of meniscal tear 2023   L knee   History of myocardial infarction 2011   History of robot-assisted laparoscopic radical prostatectomy 02/27/2023   Performed at [facility] by Dr Ranell Constable     Hyperlipidemia    very elevated lipoprotein a   Hypertension    Insomnia    Myocardial infarction [facility]) 2011   Organic impotence    Palpitations 05/2018   PVCs   Pre-diabetes    Prediabetes    fasting gluc 114 and Hba1c 5.9% Jan 2025   Prostate cancer [facility])    radical prostatectomy 03/2023   Sciatica    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  May need MRI L spine and/or emg/NCS   Squamous cell carcinoma     Surgical History: Past Surgical History:  Procedure Laterality Date   AAA screening     normal 04/2023   CARDIAC CATHETERIZATION  01/31/2013   Stents to LAD patent; small diagonal stent with 90% ostial stenosis--good overall LV function with mild anterior and focal inferoapical hypokinesis, EF 60-65%.  Unsuccessful  angioplasty of diagonal.   CARDIOVASCULAR STRESS TEST  05/26/2015   stress echo neg for ischemia   CARDIOVASCULAR STRESS TEST  10/04/2017   Myoc perf imaging: normal (EF 59%)   COLONOSCOPY  2008; 06/14/16   2008 Normal.  2018 hyperplastic polyp +internal hemorrhoids.  Recall 2028.   CYSTOSCOPY WITH INSERTION OF UROLIFT N/A 10/04/2021   Procedure: CYSTOSCOPY WITH INSERTION OF UROLIFT;  Surgeon: Sherrilee Belvie CROME, MD;  Location: [facility];  Service: Urology;  Laterality: N/A;   HYDROCELE EXCISION / REPAIR  early 2000's   INGUINAL HERNIA REPAIR Bilateral 1998   PROSTATE BIOPSY     Multiple; all neg.  Prostate MRI 2018 -NO SIGN OF PROSTATE CA.SABRA Rpt bx 12/2022->results pending as of 12/22/22   PTCA     Cath 2011---4 stents--LAD and diagonal.  2014 repeat cath for CP showed no changes.   ROBOT ASSISTED LAPAROSCOPIC RADICAL PROSTATECTOMY  02/27/2023   [name], Ranell Sor, MD   Scrotal u/s  01/2015   numerous extratesticular cystic lesions bilat, c/w numerous epididymal cysts vs loculated hydrocele--following expectantly.   VENTRAL HERNIA REPAIR N/A 09/14/2023   Procedure: REPAIR, HERNIA, VENTRAL;  Surgeon: Belinda Cough, MD;  Location: [facility] OR;  Service: General;  Laterality: N/A;  OPEN VENTRAL INCISIONAL HERNIA REPAIR WITH MESH    Home Medications:  Allergies as of 03/01/2024  Reactions   Keflex [cephalexin] Other (See Comments)   Cause the patient to develop Cdiff         Medication List        Accurate as of March 01, 2024  8:55 AM. If you have any questions, ask your nurse or doctor.          acetaminophen  500 MG tablet Commonly known as: TYLENOL  Take 500-1,000 mg by mouth every 6 (six) hours as needed (pain.).   atorvastatin  40 MG tablet Commonly known as: LIPITOR TAKE 1 TABLET BY MOUTH DAILY   Brilinta  60 MG Tabs tablet Generic drug: ticagrelor  TAKE 1 TABLET BY MOUTH TWICE  DAILY   carvedilol  6.25 MG tablet Commonly known as: COREG  TAKE 1 TABLET BY MOUTH TWICE   DAILY   diphenhydrAMINE 25 mg capsule Commonly known as: BENADRYL Take 25 mg by mouth every 6 (six) hours as needed.   inclisiran 284 MG/1.5ML Sosy injection Commonly known as: LEQVIO  Inject 284 mg into the skin every 6 (six) months.   lisinopril  5 MG tablet Commonly known as: ZESTRIL  TAKE 1 TABLET BY MOUTH DAILY   melatonin 5 MG Tabs Take 5 mg by mouth at bedtime.   multivitamin with minerals Tabs tablet Take 1 tablet by mouth in the morning.   nitroGLYCERIN  0.4 MG/SPRAY spray Commonly known as: NITROLINGUAL  Place 1 spray under the tongue every 5 (five) minutes x 3 doses as needed for chest pain.   omeprazole 20 MG capsule Commonly known as: PRILOSEC Take 20 mg by mouth daily before breakfast.   polyethylene glycol 17 g packet Commonly known as: MIRALAX / GLYCOLAX Take 17 g by mouth daily with breakfast.   tadalafil 5 MG tablet Commonly known as: CIALIS Take 5 mg by mouth in the morning.   UNABLE TO FIND 0.2 mLs by Intracavernosal route as needed. Bimix for intracorporeal (penile) injections for erectile dysfunction. Papaverine 30 mg/cc and Phentolamine 5 mg/cc. Dispense 10 ml total        Allergies:  Allergies  Allergen Reactions   Keflex [Cephalexin] Other (See Comments)    Cause the patient to develop Cdiff     Family History: Family History  Problem Relation Age of Onset   Hyperlipidemia Mother    Hypertension Mother    Heart disease Mother 51       CABG   Other Mother        Neuroendocrine carcinoma pancreas (solid mass) and liver (diffuse)   Heart disease Father 39       CABG   Hyperlipidemia Father    Hypertension Father    Colon polyps Father    Hypothyroidism Sister    Heart attack Brother 45       MI   Prostate cancer Paternal Uncle    Peripheral vascular disease Brother        Carotid    Stroke Neg Hx    Diabetes Neg Hx     Social History:  reports that he quit smoking about 45 years ago. His smoking use included cigarettes. He  started smoking about 47 years ago. He has a 0.5 pack-year smoking history. He has never used smokeless tobacco. He reports current alcohol use of about 14.0 standard drinks of alcohol per week. He reports that he does not use drugs.  ROS: All other review of systems were reviewed and are negative except what is noted above in HPI  Physical Exam: BP 111/66   Pulse (!) 51   Constitutional:  Alert and  oriented, No acute distress. HEENT: [place] AT, moist mucus membranes.  Trachea midline, no masses. Cardiovascular: No clubbing, cyanosis, or edema. Respiratory: Normal respiratory effort, no increased work of breathing. GI: Abdomen is soft, nontender, nondistended, no abdominal masses GU: No CVA tenderness.  Lymph: No cervical or inguinal lymphadenopathy. Skin: No rashes, bruises or suspicious lesions. Neurologic: Grossly intact, no focal deficits, moving all 4 extremities. Psychiatric: Normal mood and affect.  Laboratory Data: Lab Results  Component Value Date   WBC 7.4 09/11/2023   HGB 15.1 09/11/2023   HCT 44.3 09/11/2023   MCV 91.2 09/11/2023   PLT 163 09/11/2023    Lab Results  Component Value Date   CREATININE 1.03 01/02/2024    Lab Results  Component Value Date   PSA 6.62 (H) 12/21/2021   PSA 4.4 (H) 11/25/2019   PSA 5.20 11/20/2019    Lab Results  Component Value Date   TESTOSTERONE 414 11/25/2019    Lab Results  Component Value Date   HGBA1C 5.9 01/02/2024    Urinalysis    Component Value Date/Time   APPEARANCEUR Clear 08/23/2023 1032   GLUCOSEU Negative 08/23/2023 1032   BILIRUBINUR Negative 08/23/2023 1032   PROTEINUR Negative 08/23/2023 1032   NITRITE Negative 08/23/2023 1032   LEUKOCYTESUR Negative 08/23/2023 1032    Lab Results  Component Value Date   LABMICR Comment 08/23/2023   WBCUA >30 (A) 01/17/2022   LABEPIT 0-10 01/17/2022   BACTERIA Many (A) 01/17/2022    Pertinent Imaging:  No results found for this or any previous visit.  No  results found for this or any previous visit.  No results found for this or any previous visit.  No results found for this or any previous visit.  No results found for this or any previous visit.  No results found for this or any previous visit.  No results found for this or any previous visit.  No results found for this or any previous visit.   Assessment & Plan:    1. Prostate cancer (HCC) (Primary) Followup 6 months with a PSA - Urinalysis, Routine w reflex microscopic  2. Erectile dysfunction after radical prostatectomy -continue bimix   No follow-ups on file.  Belvie Clara, MD  [facility] [place]

## 2024-03-13 ENCOUNTER — Ambulatory Visit: Payer: Medicare Other | Admitting: *Deleted

## 2024-03-13 VITALS — Ht 68.0 in | Wt 165.0 lb

## 2024-03-13 DIAGNOSIS — Z0000 Encounter for general adult medical examination without abnormal findings: Secondary | ICD-10-CM

## 2024-03-13 NOTE — Progress Notes (Signed)
 Subjective:   Ricky Chavez is a 68 y.o. male who presents for Medicare Annual/Subsequent preventive examination.  Visit Complete: Virtual I connected with  Ricky Chavez on 03/13/24 by a audio enabled telemedicine application and verified that I am speaking with the correct person using two identifiers.  Patient Location: Home  Provider Location: Home Office  I discussed the limitations of evaluation and management by telemedicine. The patient expressed understanding and agreed to proceed.  Vital Signs: Because this visit was a virtual/telehealth visit, some criteria may be missing or patient reported. Any vitals not documented were not able to be obtained and vitals that have been documented are patient reported.  Cardiac Risk Factors include: advanced age (>65men, >60 women);male gender;family history of premature cardiovascular disease;obesity (BMI >30kg/m2);hypertension     Objective:    Today's Vitals   03/13/24 1136  Weight: 165 lb (74.8 kg)  Height: 5' 8 (1.727 m)   Body mass index is 25.09 kg/m.     03/13/2024   11:33 AM 09/14/2023    9:58 AM 09/11/2023    9:20 AM 07/12/2023    8:59 AM 03/08/2023   10:41 AM 01/31/2023    8:13 AM 01/24/2023    6:31 PM  Advanced Directives  Does Patient Have a Medical Advance Directive? Yes Yes Yes Yes Yes Yes No  Type of Advance Directive Living will Healthcare Power of [place];Living will Healthcare Power of [place];Living will Healthcare Power of [place];Living will Healthcare Power of [place];Living will Healthcare Power of [place];Living will   Does patient want to make changes to medical advance directive?  No - Patient declined No - Patient declined No - Patient declined     Copy of Healthcare Power of Attorney in Chart?  No - copy requested No - copy requested No - copy requested No - copy requested    Would patient like information on creating a medical advance directive?       No - Patient declined    Current  Medications (verified) Outpatient Encounter Medications as of 03/13/2024  Medication Sig   acetaminophen  (TYLENOL ) 500 MG tablet Take 500-1,000 mg by mouth every 6 (six) hours as needed (pain.).   atorvastatin  (LIPITOR) 40 MG tablet TAKE 1 TABLET BY MOUTH DAILY   BRILINTA  60 MG TABS tablet TAKE 1 TABLET BY MOUTH TWICE  DAILY   carvedilol  (COREG ) 6.25 MG tablet TAKE 1 TABLET BY MOUTH TWICE  DAILY   diphenhydrAMINE (BENADRYL) 25 mg capsule Take 25 mg by mouth every 6 (six) hours as needed.   inclisiran (LEQVIO ) 284 MG/1.5ML SOSY injection Inject 284 mg into the skin every 6 (six) months.   lisinopril  (ZESTRIL ) 5 MG tablet TAKE 1 TABLET BY MOUTH DAILY   melatonin 5 MG TABS Take 5 mg by mouth at bedtime.   Multiple Vitamin (MULTIVITAMIN WITH MINERALS) TABS tablet Take 1 tablet by mouth in the morning.   nitroGLYCERIN  (NITROLINGUAL ) 0.4 MG/SPRAY spray Place 1 spray under the tongue every 5 (five) minutes x 3 doses as needed for chest pain.   omeprazole (PRILOSEC) 20 MG capsule Take 20 mg by mouth daily before breakfast.   polyethylene glycol (MIRALAX / GLYCOLAX) 17 g packet Take 17 g by mouth daily with breakfast.   tadalafil (CIALIS) 5 MG tablet Take 5 mg by mouth in the morning.   UNABLE TO FIND 0.2 mLs by Intracavernosal route as needed. Bimix for intracorporeal (penile) injections for erectile dysfunction. Papaverine 30 mg/cc and Phentolamine 5 mg/cc. Dispense 10 ml total  No facility-administered encounter medications on file as of 03/13/2024.    Allergies (verified) Keflex [cephalexin]   History: Past Medical History:  Diagnosis Date   Allergic rhinitis    Arthritis    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  Needs DAPT lifetime due to high risk status.   Elevated PSA onset @ 2009   Neg bx x 3.  MRI c/w bph only.  PSA  inc 2024-->bx showed prostate adenocarcinoma 01/2023.   GERD (gastroesophageal reflux disease)    Hearing loss of both ears    History of meniscal tear 2023   L knee   History of myocardial infarction 2011   History of robot-assisted laparoscopic radical prostatectomy 02/27/2023   Performed at [facility] by Dr Ranell Constable     Hyperlipidemia    very elevated lipoprotein a   Hypertension    Insomnia    Myocardial infarction [facility]) 2011   Organic impotence    Palpitations 05/2018   PVCs   Pre-diabetes    Prediabetes    fasting gluc 114 and Hba1c 5.9% Jan 2025   Prostate cancer [facility])    radical prostatectomy 03/2023   Sciatica    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  May need MRI L spine and/or emg/NCS   Squamous cell carcinoma    Past Surgical History:  Procedure Laterality Date   AAA screening     normal 04/2023   CARDIAC CATHETERIZATION  01/31/2013   Stents to LAD patent; small diagonal stent with 90% ostial stenosis--good overall LV function with mild anterior and focal inferoapical hypokinesis, EF 60-65%.  Unsuccessful angioplasty of diagonal.   CARDIOVASCULAR STRESS TEST  05/26/2015   stress echo neg for ischemia   CARDIOVASCULAR STRESS TEST  10/04/2017   Myoc perf imaging: normal (EF 59%)   COLONOSCOPY  2008; 06/14/16   2008 Normal.  2018 hyperplastic polyp +internal hemorrhoids.  Recall 2028.   CYSTOSCOPY WITH INSERTION OF UROLIFT N/A 10/04/2021   Procedure: CYSTOSCOPY WITH INSERTION OF UROLIFT;  Surgeon: Sherrilee Belvie CROME, MD;  Location: [facility];  Service: Urology;  Laterality: N/A;   HYDROCELE EXCISION / REPAIR  early 2000's   INGUINAL HERNIA REPAIR Bilateral 1998   PROSTATE BIOPSY     Multiple; all neg.  Prostate MRI 2018 -NO SIGN OF PROSTATE CA.SABRA Rpt bx 12/2022->results pending as of 12/22/22   PTCA     Cath 2011---4 stents--LAD and diagonal.  2014 repeat cath for CP showed no changes.   ROBOT  ASSISTED LAPAROSCOPIC RADICAL PROSTATECTOMY  02/27/2023   [name], Ranell Sor, MD   Scrotal u/s  01/2015   numerous extratesticular cystic lesions bilat, c/w numerous epididymal cysts vs loculated hydrocele--following expectantly.   VENTRAL HERNIA REPAIR N/A 09/14/2023   Procedure: REPAIR, HERNIA, VENTRAL;  Surgeon: Belinda Cough, MD;  Location: [facility] OR;  Service: General;  Laterality: N/A;  OPEN VENTRAL INCISIONAL HERNIA REPAIR WITH MESH   Family History  Problem Relation Age of Onset   Hyperlipidemia Mother    Hypertension Mother    Heart disease Mother 61       CABG   Other Mother        Neuroendocrine carcinoma pancreas (solid mass) and liver (diffuse)   Heart disease Father 66       CABG   Hyperlipidemia  Father    Hypertension Father    Colon polyps Father    Hypothyroidism Sister    Heart attack Brother 51       MI   Prostate cancer Paternal Uncle    Peripheral vascular disease Brother        Carotid    Stroke Neg Hx    Diabetes Neg Hx    Social History   Socioeconomic History   Marital status: Married    Spouse name: Not on file   Number of children: 2   Years of education: Not on file   Highest education level: Master's degree (e.g., MA, MS, MEng, MEd, MSW, MBA)  Occupational History   Occupation: retired  Tobacco Use   Smoking status: Former    Current packs/day: 0.00    Average packs/day: 0.3 packs/day for 2.0 years (0.5 ttl pk-yrs)    Types: Cigarettes    Start date: 06/06/1976    Quit date: 06/06/1978    Years since quitting: 45.8   Smokeless tobacco: Never  Vaping Use   Vaping status: Never Used  Substance and Sexual Activity   Alcohol use: Yes    Alcohol/week: 14.0 standard drinks of alcohol    Types: 14 Glasses of wine per week    Comment: one glass of wine per day   Drug use: No   Sexual activity: Yes  Other Topics Concern   Not on file  Social History Narrative   Married, 2 grown children.   Relocated to [place] from [facility] 2016.   Educ: Masters  degree   Occup: Retired Emergency planning/management officer with [employer]   No tob.   Alc: 1 glass wine/night.   Social Drivers of Corporate investment banker Strain: Low Risk  (03/13/2024)   Overall Financial Resource Strain (CARDIA)    Difficulty of Paying Living Expenses: Not hard at all  Food Insecurity: No Food Insecurity (03/13/2024)   Hunger Vital Sign    Worried About Running Out of Food in the Last Year: Never true    Ran Out of Food in the Last Year: Never true  Transportation Needs: No Transportation Needs (03/13/2024)   PRAPARE - Administrator, Civil Service (Medical): No    Lack of Transportation (Non-Medical): No  Physical Activity: Sufficiently Active (03/13/2024)   Exercise Vital Sign    Days of Exercise per Week: 3 days    Minutes of Exercise per Session: 90 min  Recent Concern: Physical Activity - Insufficiently Active (01/01/2024)   Exercise Vital Sign    Days of Exercise per Week: 2 days    Minutes of Exercise per Session: 30 min  Stress: No Stress Concern Present (03/13/2024)   [employer] of Occupational Health - Occupational Stress Questionnaire    Feeling of Stress: Not at all  Social Connections: Socially Integrated (03/13/2024)   Social Connection and Isolation Panel    Frequency of Communication with Friends and Family: More than three times a week    Frequency of Social Gatherings with Friends and Family: More than three times a week    Attends Religious Services: More than 4 times per year    Active Member of [employer] or Organizations: Yes    Attends Engineer, structural: More than 4 times per year    Marital Status: Married    Tobacco Counseling Counseling given: Not Answered   Clinical Intake:  Pre-visit preparation completed: Yes  Pain : No/denies pain     Diabetes: No  How often  do you need to have someone help you when you read instructions, pamphlets, or other written materials from your doctor or pharmacy?: 1 -  Never  Interpreter Needed?: No  Information entered by :: Mliss Graff LPN   Activities of Daily Living    03/13/2024   11:41 AM 09/14/2023    9:36 AM  In your present state of health, do you have any difficulty performing the following activities:  Hearing? 1 1  Vision? 0 0  Difficulty concentrating or making decisions? 0 0  Walking or climbing stairs? 0   Dressing or bathing? 0   Doing errands, shopping? 0   Preparing Food and eating ? N   Using the Toilet? N   In the past six months, have you accidently leaked urine? N   Do you have problems with loss of bowel control? N   Managing your Medications? N   Managing your Finances? N   Housekeeping or managing your Housekeeping? N     Patient Care Team: Candise Aleene DEL, MD as PCP - General (Family Medicine) Lavona Agent, MD as PCP - Cardiology (Cardiology) Ivin Kocher, MD as Consulting Physician (Dermatology) Sherrilee Belvie CROME, MD as Consulting Physician (Urology) Matilda Senior, MD as Consulting Physician (Urology) Teressa Toribio SQUIBB, MD (Inactive) as Consulting Physician (Gastroenterology) Lavona Agent, MD as Consulting Physician (Cardiology) Vertell Pont, RN as Oncology Nurse Navigator  Indicate any recent Medical Services you may have received from other than [facility] providers in the past year (date may be approximate).     Assessment:   This is a routine wellness examination for Haedyn.  Hearing/Vision screen Hearing Screening - Comments:: Bilateral hearing aids Vision Screening - Comments:: My eye doctor   [place]  Up to date   Goals Addressed             This Visit's Progress    Patient Stated   Not on track    Get back to exercise in the gym      Patient Stated       Travel more       Depression Screen    03/13/2024   11:35 AM 01/02/2024    9:01 AM 10/10/2023    9:30 AM 07/12/2023    9:03 AM 03/08/2023   10:41 AM 02/10/2023    2:22 PM 02/01/2023   11:33 AM  PHQ 2/9 Scores  PHQ - 2  Score 0 0 0 0 0 2 2  PHQ- 9 Score 1    0 4 5    Fall Risk    03/13/2024   11:32 AM 01/02/2024    9:01 AM 10/10/2023    9:30 AM 07/12/2023    9:02 AM 06/19/2023    9:57 AM  Fall Risk   Falls in the past year? 0 0 0 0 0  Number falls in past yr: 0 0 0 0   Injury with Fall? 0 0 0 0   Risk for fall due to :  No Fall Risks  No Fall Risks   Follow up Falls evaluation completed;Education provided;Falls prevention discussed Falls evaluation completed  Falls evaluation completed     MEDICARE RISK AT HOME: Medicare Risk at Home Any stairs in or around the home?: No If so, are there any without handrails?: No Home free of loose throw rugs in walkways, pet beds, electrical cords, etc?: Yes Adequate lighting in your home to reduce risk of falls?: Yes Life alert?: No Use of a cane, walker or w/c?: No Grab  bars in the bathroom?: Yes Shower chair or bench in shower?: No Elevated toilet seat or a handicapped toilet?: No  TIMED UP AND GO:  Was the test performed?  No    Cognitive Function:    03/08/2023   11:27 AM  MMSE - Mini Mental State Exam  Not completed: Refused        03/13/2024   11:34 AM  6CIT Screen  What Year? 0 points  What month? 0 points  What time? 0 points  Count back from 20 0 points  Months in reverse 0 points  Repeat phrase 0 points  Total Score 0 points    Immunizations Immunization History  Administered Date(s) Administered   Fluad Trivalent(High Dose 65+) 02/01/2023   Hepatitis A, Adult 06/06/2006   Hepatitis B, ADULT 06/06/2006   Influenza Inj Mdck Quad Pf 03/08/2017   Influenza Inj Mdck Quad With Preservative 02/27/2019   Influenza,inj,Quad PF,6+ Mos 06/07/2011, 03/28/2013, 03/05/2014, 03/12/2018, 06/22/2021   Influenza-Unspecified 02/24/2016, 04/28/2020, 03/19/2022   PFIZER(Purple Top)SARS-COV-2 Vaccination 08/22/2019, 09/17/2019, 03/22/2020   PNEUMOCOCCAL CONJUGATE-20 12/22/2022   Pneumococcal Polysaccharide-23 05/12/2014   Pneumococcal-Unspecified  06/06/2014   Rabies, IM 01/25/2023, 01/27/2023, 01/31/2023   Td 12/05/2002   Tdap 09/09/2015   Tetanus 06/06/2006   Zoster Recombinant(Shingrix ) 11/21/2018, 05/22/2019   Zoster, Live 03/28/2013    TDAP status: Up to date  Flu Vaccine status: Due, Education has been provided regarding the importance of this vaccine. Advised may receive this vaccine at local pharmacy or Health Dept. Aware to provide a copy of the vaccination record if obtained from local pharmacy or Health Dept. Verbalized acceptance and understanding.  Pneumococcal vaccine status: Up to date  Covid-19 vaccine status: Declined, Education has been provided regarding the importance of this vaccine but patient still declined. Advised may receive this vaccine at local pharmacy or Health Dept.or vaccine clinic. Aware to provide a copy of the vaccination record if obtained from local pharmacy or Health Dept. Verbalized acceptance and understanding.  Qualifies for Shingles Vaccine? No   Zostavax completed Yes   Shingrix  Completed?: Yes  Screening Tests Health Maintenance  Topic Date Due   Influenza Vaccine  01/05/2024   COVID-19 Vaccine (4 - 2025-26 season) 02/05/2024   Medicare Annual Wellness (AWV)  03/13/2025   DTaP/Tdap/Td (4 - Td or Tdap) 09/08/2025   Colonoscopy  06/14/2026   Pneumococcal Vaccine: 50+ Years  Completed   Hepatitis C Screening  Completed   Zoster Vaccines- Shingrix   Completed   Meningococcal B Vaccine  Aged Out   Hepatitis B Vaccines 19-59 Average Risk  Discontinued    Health Maintenance  Health Maintenance Due  Topic Date Due   Influenza Vaccine  01/05/2024   COVID-19 Vaccine (4 - 2025-26 season) 02/05/2024    Colorectal cancer screening: Type of screening: Colonoscopy. Completed 2018. Repeat every 10 years  Lung Cancer Screening: (Low Dose CT Chest recommended if Age 19-80 years, 20 pack-year currently smoking OR have quit w/in 15years.) does not qualify.   Lung Cancer Screening Referral:    Additional Screening:  Hepatitis C Screening: does not qualify; Completed 2018  Vision Screening: Recommended annual ophthalmology exams for early detection of glaucoma and other disorders of the eye. Is the patient up to date with their annual eye exam?  No  Who is the provider or what is the name of the office in which the patient attends annual eye exams? Education provided If pt is not established with a provider, would they like to be referred to a  provider to establish care? No .   Dental Screening: Recommended annual dental exams for proper oral hygiene    Community Resource Referral / Chronic Care Management: CRR required this visit?  No   CCM required this visit?  No     Plan:     I have personally reviewed and noted the following in the patient's chart:   Medical and social history Use of alcohol, tobacco or illicit drugs  Current medications and supplements including opioid prescriptions. Patient is not currently taking opioid prescriptions. Functional ability and status Nutritional status Physical activity Advanced directives List of other physicians Hospitalizations, surgeries, and ER visits in previous 12 months Vitals Screenings to include cognitive, depression, and falls Referrals and appointments  In addition, I have reviewed and discussed with patient certain preventive protocols, quality metrics, and best practice recommendations. A written personalized care plan for preventive services as well as general preventive health recommendations were provided to patient.     Mliss Graff, LPN   [id number]/06/[id number]   After Visit Summary: (MyChart) Due to this being a telephonic visit, the after visit summary with patients personalized plan was offered to patient via MyChart   Nurse Notes:

## 2024-03-13 NOTE — Patient Instructions (Signed)
 Ricky Chavez , Thank you for taking time to come for your Medicare Wellness Visit. I appreciate your ongoing commitment to your health goals. Please review the following plan we discussed and let me know if I can assist you in the future.   Screening recommendations/referrals: Colonoscopy: up to date Recommended yearly ophthalmology/optometry visit for glaucoma screening and checkup Recommended yearly dental visit for hygiene and checkup  Vaccinations: Influenza vaccine: Education provided Pneumococcal vaccine: up to date Tdap vaccine: up to date Shingles vaccine: up to date       Preventive Care 65 Years and Older, Male Preventive care refers to lifestyle choices and visits with your health care provider that can promote health and wellness. What does preventive care include? A yearly physical exam. This is also called an annual well check. Dental exams once or twice a year. Routine eye exams. Ask your health care provider how often you should have your eyes checked. Personal lifestyle choices, including: Daily care of your teeth and gums. Regular physical activity. Eating a healthy diet. Avoiding tobacco and drug use. Limiting alcohol use. Practicing safe sex. Taking low doses of aspirin every day. Taking vitamin and mineral supplements as recommended by your health care provider. What happens during an annual well check? The services and screenings done by your health care provider during your annual well check will depend on your age, overall health, lifestyle risk factors, and family history of disease. Counseling  Your health care provider may ask you questions about your: Alcohol use. Tobacco use. Drug use. Emotional well-being. Home and relationship well-being. Sexual activity. Eating habits. History of falls. Memory and ability to understand (cognition). Work and work Astronomer. Screening  You may have the following tests or measurements: Height, weight, and  BMI. Blood pressure. Lipid and cholesterol levels. These may be checked every 5 years, or more frequently if you are over 40 years old. Skin check. Lung cancer screening. You may have this screening every year starting at age 59 if you have a 30-pack-year history of smoking and currently smoke or have quit within the past 15 years. Fecal occult blood test (FOBT) of the stool. You may have this test every year starting at age 57. Flexible sigmoidoscopy or colonoscopy. You may have a sigmoidoscopy every 5 years or a colonoscopy every 10 years starting at age 27. Prostate cancer screening. Recommendations will vary depending on your family history and other risks. Hepatitis C blood test. Hepatitis B blood test. Sexually transmitted disease (STD) testing. Diabetes screening. This is done by checking your blood sugar (glucose) after you have not eaten for a while (fasting). You may have this done every 1-3 years. Abdominal aortic aneurysm (AAA) screening. You may need this if you are a current or former smoker. Osteoporosis. You may be screened starting at age 48 if you are at high risk. Talk with your health care provider about your test results, treatment options, and if necessary, the need for more tests. Vaccines  Your health care provider may recommend certain vaccines, such as: Influenza vaccine. This is recommended every year. Tetanus, diphtheria, and acellular pertussis (Tdap, Td) vaccine. You may need a Td booster every 10 years. Zoster vaccine. You may need this after age 94. Pneumococcal 13-valent conjugate (PCV13) vaccine. One dose is recommended after age 61. Pneumococcal polysaccharide (PPSV23) vaccine. One dose is recommended after age 71. Talk to your health care provider about which screenings and vaccines you need and how often you need them. This information is not intended  to replace advice given to you by your health care provider. Make sure you discuss any questions you have  with your health care provider. Document Released: 06/19/2015 Document Revised: 02/10/2016 Document Reviewed: 03/24/2015 Elsevier Interactive Patient Education  2017 [employer].  Fall Prevention in the Home Falls can cause injuries. They can happen to people of all ages. There are many things you can do to make your home safe and to help prevent falls. What can I do on the outside of my home? Regularly fix the edges of walkways and driveways and fix any cracks. Remove anything that might make you trip as you walk through a door, such as a raised step or threshold. Trim any bushes or trees on the path to your home. Use bright outdoor lighting. Clear any walking paths of anything that might make someone trip, such as rocks or tools. Regularly check to see if handrails are loose or broken. Make sure that both sides of any steps have handrails. Any raised decks and porches should have guardrails on the edges. Have any leaves, snow, or ice cleared regularly. Use sand or salt on walking paths during winter. Clean up any spills in your garage right away. This includes oil or grease spills. What can I do in the bathroom? Use night lights. Install grab bars by the toilet and in the tub and shower. Do not use towel bars as grab bars. Use non-skid mats or decals in the tub or shower. If you need to sit down in the shower, use a plastic, non-slip stool. Keep the floor dry. Clean up any water  that spills on the floor as soon as it happens. Remove soap buildup in the tub or shower regularly. Attach bath mats securely with double-sided non-slip rug tape. Do not have throw rugs and other things on the floor that can make you trip. What can I do in the bedroom? Use night lights. Make sure that you have a light by your bed that is easy to reach. Do not use any sheets or blankets that are too big for your bed. They should not hang down onto the floor. Have a firm chair that has side arms. You can use  this for support while you get dressed. Do not have throw rugs and other things on the floor that can make you trip. What can I do in the kitchen? Clean up any spills right away. Avoid walking on wet floors. Keep items that you use a lot in easy-to-reach places. If you need to reach something above you, use a strong step stool that has a grab bar. Keep electrical cords out of the way. Do not use floor polish or wax that makes floors slippery. If you must use wax, use non-skid floor wax. Do not have throw rugs and other things on the floor that can make you trip. What can I do with my stairs? Do not leave any items on the stairs. Make sure that there are handrails on both sides of the stairs and use them. Fix handrails that are broken or loose. Make sure that handrails are as long as the stairways. Check any carpeting to make sure that it is firmly attached to the stairs. Fix any carpet that is loose or worn. Avoid having throw rugs at the top or bottom of the stairs. If you do have throw rugs, attach them to the floor with carpet tape. Make sure that you have a light switch at the top of the stairs and  the bottom of the stairs. If you do not have them, ask someone to add them for you. What else can I do to help prevent falls? Wear shoes that: Do not have high heels. Have rubber bottoms. Are comfortable and fit you well. Are closed at the toe. Do not wear sandals. If you use a stepladder: Make sure that it is fully opened. Do not climb a closed stepladder. Make sure that both sides of the stepladder are locked into place. Ask someone to hold it for you, if possible. Clearly mark and make sure that you can see: Any grab bars or handrails. First and last steps. Where the edge of each step is. Use tools that help you move around (mobility aids) if they are needed. These include: Canes. Walkers. Scooters. Crutches. Turn on the lights when you go into a dark area. Replace any light bulbs  as soon as they burn out. Set up your furniture so you have a clear path. Avoid moving your furniture around. If any of your floors are uneven, fix them. If there are any pets around you, be aware of where they are. Review your medicines with your doctor. Some medicines can make you feel dizzy. This can increase your chance of falling. Ask your doctor what other things that you can do to help prevent falls. This information is not intended to replace advice given to you by your health care provider. Make sure you discuss any questions you have with your health care provider. Document Released: 03/19/2009 Document Revised: 10/29/2015 Document Reviewed: 06/27/2014 Elsevier Interactive Patient Education  2017 [employer].

## 2024-03-19 ENCOUNTER — Ambulatory Visit: Attending: Cardiology

## 2024-03-19 DIAGNOSIS — E785 Hyperlipidemia, unspecified: Secondary | ICD-10-CM | POA: Insufficient documentation

## 2024-03-19 NOTE — Patient Instructions (Addendum)
 Your Results:             Your most recent labs Goal  Total Cholesterol 86 < 200  Triglycerides 62 < 150  HDL (happy/good cholesterol) 45 > 40  LDL (lousy/bad cholesterol 28 < 55   Continue taking current medications: atorvastatin  40 mg daily and Leqvio  every 6 mo Will apply for pelacarsen through [employer] managed access program  Will inform you once determination is made by [employer] E. Javohn Basey, Pharm.D [place] [facility]. [facility] [address], [place], [place] [address] Phone: [phone]; Fax: [phone]

## 2024-03-19 NOTE — Assessment & Plan Note (Addendum)
 Assessment:  LDL goal: < 55 mg/dl; last LDLc 28 mg/dl (07/7972) Elevated Lpa 675 nmol/L Patient with hx of MI (2011), CAD s/p stents to LAD patent (2011) Tolerates atorvastatin  and Leqvio  well without any side effects Discussed the low likelihood of approval due to absence of CV events since 2011, lipid panel at goal and exclusion criteria (hx of cancer and PCI. However, will proceed with application based on patient's hx of premature ASCVD, elevated Lpa and family hx of heart disease Plan: Continue taking current medications: atorvastatin  40 mg daily and Leqvio  every 6 mo Will apply for pelacarsen through [employer] managed access program  Will inform patient once determination is made by [employer]

## 2024-03-19 NOTE — Progress Notes (Signed)
 Patient ID: Ricky Chavez                 DOB: [birth year]                    MRN: [id number]      HPI: Ricky Chavez is a 68 y.o. male patient referred to lipid clinic by Dr. Lavona. PMH is significant for HLD, CAD s/p stents to LAD patent, HTN, hx of MI (2011), and hx of prostate cancer.  Patient presents today with elevated Lpa (324 nmol/L) and is inquiring about the potential enrollment in a clinical trial targeting Lpa or possibility of compassionate use access to the Novartis investigational drug. The patient voiced concern regarding elevated Lpa levels and expressed strong interest in seeking options to lower Lpa.   Of note, patient was informed that he won't qualify for the [employer]'s OCEAN- PREVENT trial (olpasiran) due to prior PCI and patient tried to enroll in the [employer]'s ACCLAIM-Lpa study (lepodisiran) but excluded due to hx of cancer.   Patient is requesting health advocate to make the inquiry for the Novartis drug, pelacarsen through the managed access program.  Patient is currently taking Leqvio  and atorvastatin . He is tolerating both medications well. He's been on the atorvastatin  for several years and Leqvio  ~ 8 months   Current Medications: atorvastatin  40 mg daily, and Leqvio  284 mg every 6 mo  Intolerances: none Risk Factors: CAD, HTN, hx of MI, family hx of heart disease LDL goal: < 55  Lipid panel (12/2023): Chol 86, Trig 62, HDL 45, LDL 28 mg/dL Lpa (01/7973): 675 nmol/L  Family History:  Relation Problem Comments  Mother (Deceased) Heart disease (Age: 57) CABG  Hyperlipidemia   Hypertension   Other Neuroendocrine carcinoma pancreas (solid mass) and liver (diffuse)    Father (Alive) Colon polyps   Heart disease (Age: 61) CABG  Hyperlipidemia   Hypertension     Sister Metallurgist) Hypothyroidism     Brother - younger (Alive) Heart attack (Age: 57) MI    Brother - older Metallurgist) Peripheral vascular disease Carotid    Paternal Uncle Metallurgist) Prostate cancer      Neg Hx Diabetes   Stroke      Social History:  Smoking: none  Alcohol: 3-4 glasses of wine per week   Labs:  Lipid Panel     Component Value Date/Time   CHOL 86 01/02/2024 0913   TRIG 62.0 01/02/2024 0913   HDL 45.70 01/02/2024 0913   CHOLHDL 2 01/02/2024 0913   VLDL 12.4 01/02/2024 0913   LDLCALC 28 01/02/2024 0913   LDLCALC 73 12/21/2021 0918    Past Medical History:  Diagnosis Date   Allergic rhinitis    Arthritis    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  Needs DAPT lifetime due to high risk status.   Elevated PSA onset @ 2009   Neg bx x 3.  MRI c/w bph only.  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   GERD (gastroesophageal reflux disease)    Hearing loss of both ears    History of meniscal tear 2023   L knee   History of myocardial infarction 2011   History of robot-assisted laparoscopic radical prostatectomy 02/27/2023   Performed at [facility] by Dr Ranell Constable  Hyperlipidemia    very elevated lipoprotein a   Hypertension    Insomnia    Myocardial infarction [facility]) 2011   Organic impotence    Palpitations 05/2018   PVCs   Pre-diabetes    Prediabetes    fasting gluc 114 and Hba1c 5.9% Jan 2025   Prostate cancer [facility])    radical prostatectomy 03/2023   Sciatica    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  May need MRI L spine and/or emg/NCS   Squamous cell carcinoma     Current Outpatient Medications on File Prior to Visit  Medication Sig Dispense Refill   acetaminophen  (TYLENOL ) 500 MG tablet Take 500-1,000 mg by mouth every 6 (six) hours as needed (pain.).     atorvastatin  (LIPITOR) 40 MG tablet TAKE 1 TABLET BY MOUTH DAILY 90 tablet 3   BRILINTA  60 MG TABS tablet TAKE 1 TABLET BY MOUTH TWICE   DAILY 180 tablet 3   carvedilol  (COREG ) 6.25 MG tablet TAKE 1 TABLET BY MOUTH TWICE  DAILY 180 tablet 1   diphenhydrAMINE (BENADRYL) 25 mg capsule Take 25 mg by mouth every 6 (six) hours as needed.     inclisiran (LEQVIO ) 284 MG/1.5ML SOSY injection Inject 284 mg into the skin every 6 (six) months.     lisinopril  (ZESTRIL ) 5 MG tablet TAKE 1 TABLET BY MOUTH DAILY 90 tablet 1   melatonin 5 MG TABS Take 5 mg by mouth at bedtime.     Multiple Vitamin (MULTIVITAMIN WITH MINERALS) TABS tablet Take 1 tablet by mouth in the morning.     nitroGLYCERIN  (NITROLINGUAL ) 0.4 MG/SPRAY spray Place 1 spray under the tongue every 5 (five) minutes x 3 doses as needed for chest pain. 12 g 3   omeprazole (PRILOSEC) 20 MG capsule Take 20 mg by mouth daily before breakfast.     polyethylene glycol (MIRALAX / GLYCOLAX) 17 g packet Take 17 g by mouth daily with breakfast.     tadalafil (CIALIS) 5 MG tablet Take 5 mg by mouth in the morning.     UNABLE TO FIND 0.2 mLs by Intracavernosal route as needed. Bimix for intracorporeal (penile) injections for erectile dysfunction. Papaverine 30 mg/cc and Phentolamine 5 mg/cc. Dispense 10 ml total     No current facility-administered medications on file prior to visit.    Allergies  Allergen Reactions   Keflex [Cephalexin] Other (See Comments)    Cause the patient to develop Cdiff     Assessment/Plan:  1. Hyperlipidemia -  Problem  Dyslipidemia   Dyslipidemia Assessment:  LDL goal: < 55 mg/dl; last LDLc 28 mg/dl (07/7972) Elevated Lpa 675 nmol/L Patient with hx of MI (2011), CAD s/p stents to LAD patent (2011) Tolerates atorvastatin  and Leqvio  well without any side effects Discussed the low likelihood of approval due to absence of CV events since 2011, lipid panel at goal and exclusion criteria (hx of cancer and PCI. However, will proceed with application based on patient's hx of premature ASCVD, elevated Lpa and family hx of heart disease Plan: Continue taking  current medications: atorvastatin  40 mg daily and Leqvio  every 6 mo Will apply for pelacarsen through [employer] managed access program  Will inform patient once determination is made by [employer],  Geanine Vandekamp E. Bingham Millette, Pharm.D [place] [facility]. [facility] [address], [place], [place] [address] Phone: [phone]; Fax: [phone]

## 2024-03-20 ENCOUNTER — Telehealth: Payer: Self-pay

## 2024-03-20 NOTE — Telephone Encounter (Signed)
 Confirmed submission of application for Pelacarsen via the [employer] ([employer]) Managed Access Portal. No confirmation number provided at this time. Will continue to monitor the portal for updates on request status.

## 2024-04-02 ENCOUNTER — Encounter: Payer: Self-pay | Admitting: Cardiology

## 2024-04-02 DIAGNOSIS — E785 Hyperlipidemia, unspecified: Secondary | ICD-10-CM

## 2024-04-09 ENCOUNTER — Other Ambulatory Visit: Payer: Self-pay | Admitting: Cardiology

## 2024-04-11 ENCOUNTER — Encounter: Attending: Cardiology | Admitting: Internal Medicine

## 2024-04-11 VITALS — BP 134/80 | HR 50 | Resp 16

## 2024-04-11 DIAGNOSIS — I25810 Atherosclerosis of coronary artery bypass graft(s) without angina pectoris: Secondary | ICD-10-CM | POA: Insufficient documentation

## 2024-04-11 DIAGNOSIS — E785 Hyperlipidemia, unspecified: Secondary | ICD-10-CM | POA: Diagnosis present

## 2024-04-11 MED ORDER — INCLISIRAN SODIUM 284 MG/1.5ML ~~LOC~~ SOSY
284.0000 mg | PREFILLED_SYRINGE | Freq: Once | SUBCUTANEOUS | Status: AC
  Administered 2024-04-11: 284 mg via SUBCUTANEOUS

## 2024-04-11 NOTE — Progress Notes (Signed)
 Diagnosis: Hyperlipidemia  Provider:  Lavona Agent, MD  Procedure: Injection  Leqvio  (inclisiran), Dose: 284 mg, Site: subcutaneous, Number of injections: 1  Injection Site(s): Right lower quad. abdomne  Post Care: Observation period completed  Discharge: Condition: Good, Destination: Home . AVS Provided  Performed by:  Blanca Selinda SAUNDERS, LPN

## 2024-07-04 ENCOUNTER — Ambulatory Visit: Admitting: Family Medicine

## 2024-07-04 ENCOUNTER — Encounter: Payer: Self-pay | Admitting: Family Medicine

## 2024-07-04 VITALS — BP 103/65 | HR 49 | Temp 95.4°F | Ht 68.0 in | Wt 170.0 lb

## 2024-07-04 DIAGNOSIS — E7800 Pure hypercholesterolemia, unspecified: Secondary | ICD-10-CM | POA: Diagnosis not present

## 2024-07-04 DIAGNOSIS — R7303 Prediabetes: Secondary | ICD-10-CM

## 2024-07-04 DIAGNOSIS — I10 Essential (primary) hypertension: Secondary | ICD-10-CM | POA: Diagnosis not present

## 2024-07-04 LAB — COMPREHENSIVE METABOLIC PANEL WITH GFR
ALT: 37 U/L (ref 3–53)
AST: 35 U/L (ref 5–37)
Albumin: 4.4 g/dL (ref 3.5–5.2)
Alkaline Phosphatase: 56 U/L (ref 39–117)
BUN: 17 mg/dL (ref 6–23)
CO2: 32 meq/L (ref 19–32)
Calcium: 9.5 mg/dL (ref 8.4–10.5)
Chloride: 104 meq/L (ref 96–112)
Creatinine, Ser: 0.96 mg/dL (ref 0.40–1.50)
GFR: 81.87 mL/min
Glucose, Bld: 111 mg/dL — ABNORMAL HIGH (ref 70–99)
Potassium: 4.7 meq/L (ref 3.5–5.1)
Sodium: 140 meq/L (ref 135–145)
Total Bilirubin: 0.8 mg/dL (ref 0.2–1.2)
Total Protein: 6.8 g/dL (ref 6.0–8.3)

## 2024-07-04 LAB — LIPID PANEL
Cholesterol: 101 mg/dL (ref 28–200)
HDL: 49.3 mg/dL
LDL Cholesterol: 34 mg/dL (ref 10–99)
NonHDL: 51.5
Total CHOL/HDL Ratio: 2
Triglycerides: 89 mg/dL (ref 10.0–149.0)
VLDL: 17.8 mg/dL (ref 0.0–40.0)

## 2024-07-04 LAB — HEMOGLOBIN A1C: Hgb A1c MFr Bld: 5.6 % (ref 4.6–6.5)

## 2024-07-04 NOTE — Progress Notes (Addendum)
 OFFICE VISIT  07/04/2024  CC:  Chief Complaint  Patient presents with   Medical Management of Chronic Issues    Pt is fasting    Patient is a 69 y.o. male who presents for 6-month follow-up hypertension, hypercholesterolemia, and prediabetes. A/P as of last visit: 1 hypertension, well-controlled on lisinopril  5 mg a day and carvedilol  6.25 mg twice daily. Blood pressure has been low normal consistently, is low in the office today but he feels well. Will not make any medication changes today. Electrolytes and creatinine today.   #2  Hypercholesterolemia.  Doing well on Lipitor 40 mg a day plus cardiology recently added Leqvio . Monitor lipid panel and hepatic panel today.   Will forward results to Dr. Lavona to make him aware.   3) Prediabetes, Hba1c 5.9% about 6 mo ago. Hba1c today.   #4 right elbow medial epicondylitis. Discussed relative rest, ice, gentle home rehab exercises. Discussed return for injection if not significantly improving in a few weeks.  INTERIM HX: He is feeling well other than some right shoulder pain for the last couple months.  He strained it lifting weights. He went to orthopedist and says an x-ray was done.  Diagnosed with nonspecific sprain/strain and has been doing some rest and rehab and it is slowly improving.  Has improved diet from a carb standpoint.  He exercises very regularly. 3 months ago he decreased his atorvastatin  to a half tab a day due to leg pains.  ROS as above, plus--> no fevers, no CP, no SOB, no wheezing, no cough, no dizziness, no HAs, no rashes, no melena/hematochezia.  No polyuria or polydipsia.  No focal weakness, paresthesias, or tremors.  No acute vision or hearing abnormalities.  No dysuria or unusual/new urinary urgency or frequency.  No recent changes in lower legs. No n/v/d or abd pain.  No palpitations.    Past Medical History:  Diagnosis Date   Allergic rhinitis    Arthritis    Benign prostatic hyperplasia with  urinary obstruction 07/15/2020   BPH with obstruction/lower urinary tract symptoms    flomax and finasteride qod per urol   Coronary artery disease    With preserved EF.  Needs DAPT lifetime due to high risk status.   Elevated PSA onset @ 2009   Neg bx x 3.  MRI c/w bph only.  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   GERD (gastroesophageal reflux disease)    Hearing loss of both ears    History of meniscal tear 2023   L knee   History of myocardial infarction 2011   History of robot-assisted laparoscopic radical prostatectomy 02/27/2023   Performed at [facility] by Dr Ranell Constable     Hyperlipidemia    very elevated lipoprotein a   Hypertension    Insomnia    Myocardial infarction [facility])    Organic impotence    Palpitations 05/2018   PVCs   Pre-diabetes    Prediabetes    fasting gluc 114 and Hba1c 5.9% Jan 2025   Prostate cancer [facility])    radical prostatectomy 03/2023   Sciatica    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  May need MRI L spine and/or emg/NCS   Squamous cell carcinoma    Weak urinary stream 07/15/2020    Past Surgical History:  Procedure Laterality Date   AAA screening     normal 04/2023   CARDIAC CATHETERIZATION  01/31/2013   Stents to LAD patent; small diagonal  stent with 90% ostial stenosis--good overall LV function with mild anterior and focal inferoapical hypokinesis, EF 60-65%.  Unsuccessful angioplasty of diagonal.   CARDIOVASCULAR STRESS TEST  05/26/2015   stress echo neg for ischemia   CARDIOVASCULAR STRESS TEST  10/04/2017   Myoc perf imaging: normal (EF 59%)   COLONOSCOPY  2008; 06/14/16   2008 Normal.  2018 hyperplastic polyp +internal hemorrhoids.  Recall 2028.   CYSTOSCOPY WITH INSERTION OF UROLIFT N/A 10/04/2021   Procedure: CYSTOSCOPY WITH INSERTION OF UROLIFT;  Surgeon: Sherrilee Belvie CROME, MD;  Location: [facility];  Service: Urology;  Laterality: N/A;   HYDROCELE  EXCISION / REPAIR  early 2000's   INGUINAL HERNIA REPAIR Bilateral 1998   PROSTATE BIOPSY     Multiple; all neg.  Prostate MRI 2018 -NO SIGN OF PROSTATE CA.SABRA Rpt bx 12/2022->results pending as of 12/22/22   PTCA     Cath 2011---4 stents--LAD and diagonal.  2014 repeat cath for CP showed no changes.   ROBOT ASSISTED LAPAROSCOPIC RADICAL PROSTATECTOMY  02/27/2023   [name], Ranell Sor, MD   Scrotal u/s  01/2015   numerous extratesticular cystic lesions bilat, c/w numerous epididymal cysts vs loculated hydrocele--following expectantly.   VENTRAL HERNIA REPAIR N/A 09/14/2023   Procedure: REPAIR, HERNIA, VENTRAL;  Surgeon: Belinda Cough, MD;  Location: [facility] OR;  Service: General;  Laterality: N/A;  OPEN VENTRAL INCISIONAL HERNIA REPAIR WITH MESH    Outpatient Medications Prior to Visit  Medication Sig Dispense Refill   acetaminophen  (TYLENOL ) 500 MG tablet Take 500-1,000 mg by mouth every 6 (six) hours as needed (pain.).     atorvastatin  (LIPITOR) 40 MG tablet TAKE 1 TABLET BY MOUTH DAILY 90 tablet 3   BRILINTA  60 MG TABS tablet TAKE 1 TABLET BY MOUTH TWICE  DAILY 180 tablet 3   carvedilol  (COREG ) 6.25 MG tablet TAKE 1 TABLET BY MOUTH TWICE  DAILY 180 tablet 3   diphenhydrAMINE (BENADRYL) 25 mg capsule Take 25 mg by mouth every 6 (six) hours as needed.     inclisiran (LEQVIO ) 284 MG/1.5ML SOSY injection Inject 284 mg into the skin every 6 (six) months.     lisinopril  (ZESTRIL ) 5 MG tablet TAKE 1 TABLET BY MOUTH DAILY 90 tablet 3   melatonin 5 MG TABS Take 5 mg by mouth at bedtime.     Multiple Vitamin (MULTIVITAMIN WITH MINERALS) TABS tablet Take 1 tablet by mouth in the morning.     nitroGLYCERIN  (NITROLINGUAL ) 0.4 MG/SPRAY spray Place 1 spray under the tongue every 5 (five) minutes x 3 doses as needed for chest pain. 12 g 3   omeprazole (PRILOSEC) 20 MG capsule Take 20 mg by mouth daily before breakfast.     polyethylene glycol (MIRALAX / GLYCOLAX) 17 g packet Take 17 g by mouth daily with  breakfast.     tadalafil (CIALIS) 5 MG tablet Take 5 mg by mouth in the morning.     UNABLE TO FIND 0.2 mLs by Intracavernosal route as needed. Bimix for intracorporeal (penile) injections for erectile dysfunction. Papaverine 30 mg/cc and Phentolamine 5 mg/cc. Dispense 10 ml total     No facility-administered medications prior to visit.    Allergies[1]  Review of Systems As per HPI  PE:    07/04/2024    7:53 AM 04/11/2024   10:52 AM 03/13/2024   11:36 AM  Vitals with BMI  Height 5' 8  5' 8  Weight 170 lbs  165 lbs  BMI 25.85  25.09  Systolic 103 134  Diastolic 65 80   Pulse 49 50    Physical Exam  Gen: Alert, well appearing.  Patient is oriented to person, place, time, and situation. CV: RRR, no m/r/g.   LUNGS: CTA bilat, nonlabored resps, good aeration in all lung fields. EXT: no clubbing or cyanosis.  no edema.   LABS:  Last CBC Lab Results  Component Value Date   WBC 7.4 09/11/2023   HGB 15.1 09/11/2023   HCT 44.3 09/11/2023   MCV 91.2 09/11/2023   MCH 31.1 09/11/2023   RDW 13.0 09/11/2023   PLT 163 09/11/2023   Last metabolic panel Lab Results  Component Value Date   GLUCOSE 102 (H) 01/02/2024   NA 139 01/02/2024   K 4.4 01/02/2024   CL 103 01/02/2024   CO2 28 01/02/2024   BUN 20 01/02/2024   CREATININE 1.03 01/02/2024   GFR 75.51 01/02/2024   CALCIUM  9.2 01/02/2024   PROT 6.4 01/02/2024   ALBUMIN 4.3 01/02/2024   BILITOT 0.8 01/02/2024   ALKPHOS 61 01/02/2024   AST 31 01/02/2024   ALT 35 01/02/2024   ANIONGAP 8 09/11/2023   Last lipids Lab Results  Component Value Date   CHOL 86 01/02/2024   HDL 45.70 01/02/2024   LDLCALC 28 01/02/2024   TRIG 62.0 01/02/2024   CHOLHDL 2 01/02/2024   Last hemoglobin A1c Lab Results  Component Value Date   HGBA1C 5.9 01/02/2024   IMPRESSION AND PLAN:  1 hypertension, well-controlled on lisinopril  5 mg a day and carvedilol  6.25 mg twice daily. Blood pressure has been low normal consistently, is low  in the office today but he feels well. Will not make any medication changes today. Electrolytes and creatinine today.   #2  Hypercholesterolemia.  Doing well on HALF of lipitor 40 mg tab a day and Leqvio  q6mo. Monitor lipid panel and hepatic panel today.  Will forward results to Dr. Lavona to make him aware.   3) Prediabetes, Hba1c 5.9% about 6 mo ago. Hba1c today.  4) right shoulder sprain/strain. He is established with an orthopedist at [facility] in [place]. Gradually improving with rest and rehab.  An After Visit Summary was printed and given to the patient.  FOLLOW UP: Return in about 6 months (around 01/01/2025) for routine chronic illness f/u.  Signed:  Gerlene Hockey, MD           07/04/2024      [1]  Allergies Allergen Reactions   Keflex [Cephalexin] Other (See Comments)    Cause the patient to develop Cdiff

## 2024-07-05 ENCOUNTER — Ambulatory Visit: Payer: Self-pay | Admitting: Family Medicine

## 2024-07-05 ENCOUNTER — Other Ambulatory Visit: Payer: Self-pay | Admitting: Family Medicine

## 2024-07-05 MED ORDER — ATORVASTATIN CALCIUM 40 MG PO TABS: 20.0000 mg | ORAL_TABLET | Freq: Every day | ORAL | Status: AC

## 2024-08-30 ENCOUNTER — Other Ambulatory Visit

## 2024-09-06 ENCOUNTER — Ambulatory Visit: Admitting: Urology

## 2024-10-10 ENCOUNTER — Ambulatory Visit

## 2025-01-01 ENCOUNTER — Ambulatory Visit: Admitting: Family Medicine

## 2025-03-19 ENCOUNTER — Ambulatory Visit
